# Patient Record
Sex: Male | Born: 1945 | Race: White | Hispanic: No | Marital: Married | Smoking: Former smoker
Health system: Southern US, Community
[De-identification: ages and names within clinical notes are randomized; demographics above are authoritative.]

## PROBLEM LIST (undated history)

## (undated) DIAGNOSIS — C44329 Squamous cell carcinoma of skin of other parts of face: Secondary | ICD-10-CM

## (undated) HISTORY — DX: Squamous cell carcinoma of skin of other parts of face: C44.329

## (undated) MED FILL — Ferumoxytol Inj 510 MG/17ML (30 MG/ML) (Elemental Fe): INTRAVENOUS | Qty: 17 | Status: AC

---

## 2020-06-12 DIAGNOSIS — C44329 Squamous cell carcinoma of skin of other parts of face: Secondary | ICD-10-CM | POA: Diagnosis not present

## 2020-06-16 DIAGNOSIS — C44329 Squamous cell carcinoma of skin of other parts of face: Secondary | ICD-10-CM

## 2020-08-07 DIAGNOSIS — C44329 Squamous cell carcinoma of skin of other parts of face: Secondary | ICD-10-CM

## 2020-09-10 ENCOUNTER — Encounter: Payer: Self-pay | Admitting: Oncology

## 2020-09-10 DIAGNOSIS — C44329 Squamous cell carcinoma of skin of other parts of face: Secondary | ICD-10-CM | POA: Insufficient documentation

## 2020-09-17 ENCOUNTER — Encounter: Payer: Self-pay | Admitting: Pharmacist

## 2020-09-17 DIAGNOSIS — C44329 Squamous cell carcinoma of skin of other parts of face: Secondary | ICD-10-CM

## 2020-09-27 ENCOUNTER — Other Ambulatory Visit: Payer: Self-pay | Admitting: Hematology and Oncology

## 2020-09-27 ENCOUNTER — Encounter: Payer: Self-pay | Admitting: Pharmacist

## 2020-09-27 DIAGNOSIS — C44329 Squamous cell carcinoma of skin of other parts of face: Secondary | ICD-10-CM | POA: Diagnosis not present

## 2020-09-27 LAB — BASIC METABOLIC PANEL
BUN: 21 (ref 4–21)
CO2: 27 — AB (ref 13–22)
Chloride: 101 (ref 99–108)
Creatinine: 1 (ref 0.6–1.3)
Glucose: 101
Potassium: 4.8 (ref 3.4–5.3)
Sodium: 137 (ref 137–147)

## 2020-09-27 LAB — HEPATIC FUNCTION PANEL
ALT: 21 (ref 10–40)
AST: 26 (ref 14–40)
Alkaline Phosphatase: 55 (ref 25–125)
Bilirubin, Total: 0.6

## 2020-09-27 LAB — COMPREHENSIVE METABOLIC PANEL
Albumin: 3.9 (ref 3.5–5.0)
Calcium: 9.5 (ref 8.7–10.7)

## 2020-09-27 LAB — CBC AND DIFFERENTIAL
HCT: 28 — AB (ref 41–53)
Hemoglobin: 9.2 — AB (ref 13.5–17.5)
Neutrophils Absolute: 5
Platelets: 303 (ref 150–399)
WBC: 5.3

## 2020-09-27 LAB — CBC: RBC: 3.34 — AB (ref 3.87–5.11)

## 2020-09-28 ENCOUNTER — Inpatient Hospital Stay: Payer: Medicare Other | Attending: Oncology

## 2020-09-28 ENCOUNTER — Other Ambulatory Visit: Payer: Self-pay | Admitting: Hematology and Oncology

## 2020-09-28 ENCOUNTER — Other Ambulatory Visit: Payer: Self-pay

## 2020-09-28 VITALS — BP 142/68 | HR 68 | Temp 99.0°F

## 2020-09-28 DIAGNOSIS — Z5111 Encounter for antineoplastic chemotherapy: Secondary | ICD-10-CM | POA: Insufficient documentation

## 2020-09-28 DIAGNOSIS — C44329 Squamous cell carcinoma of skin of other parts of face: Secondary | ICD-10-CM

## 2020-09-28 DIAGNOSIS — C4339 Malignant melanoma of other parts of face: Secondary | ICD-10-CM | POA: Insufficient documentation

## 2020-09-28 DIAGNOSIS — K122 Cellulitis and abscess of mouth: Secondary | ICD-10-CM | POA: Diagnosis not present

## 2020-09-28 MED ORDER — SODIUM CHLORIDE 0.9 % IV SOLN
Freq: Once | INTRAVENOUS | Status: AC
  Filled 2020-09-28: qty 10

## 2020-09-28 MED ORDER — SODIUM CHLORIDE 0.9% FLUSH
10.0000 mL | INTRAVENOUS | Status: DC | PRN
  Filled 2020-09-28: qty 10

## 2020-09-28 MED ORDER — SODIUM CHLORIDE 0.9 % IV SOLN
10.0000 mg | Freq: Once | INTRAVENOUS | Status: AC
  Administered 2020-09-28: 10 mg via INTRAVENOUS
  Filled 2020-09-28: qty 10

## 2020-09-28 MED ORDER — SODIUM CHLORIDE 0.9 % IV SOLN
150.0000 mg | Freq: Once | INTRAVENOUS | Status: AC
  Administered 2020-09-28: 150 mg via INTRAVENOUS
  Filled 2020-09-28: qty 150

## 2020-09-28 MED ORDER — PALONOSETRON HCL INJECTION 0.25 MG/5ML
0.2500 mg | Freq: Once | INTRAVENOUS | Status: AC
  Administered 2020-09-28: 0.25 mg via INTRAVENOUS

## 2020-09-28 MED ORDER — SODIUM CHLORIDE 0.9 % IV SOLN
Freq: Once | INTRAVENOUS | Status: AC
  Filled 2020-09-28: qty 250

## 2020-09-28 MED ORDER — PALONOSETRON HCL INJECTION 0.25 MG/5ML
INTRAVENOUS | Status: AC
  Filled 2020-09-28: qty 5

## 2020-09-28 MED ORDER — HEPARIN SOD (PORK) LOCK FLUSH 100 UNIT/ML IV SOLN
500.0000 [IU] | Freq: Once | INTRAVENOUS | Status: DC | PRN
  Filled 2020-09-28: qty 5

## 2020-09-28 MED ORDER — SODIUM CHLORIDE 0.9 % IV SOLN
52.0000 mg/m2 | Freq: Once | INTRAVENOUS | Status: AC
  Administered 2020-09-28: 95 mg via INTRAVENOUS
  Filled 2020-09-28: qty 95

## 2020-09-28 NOTE — Patient Instructions (Signed)
[facility] Discharge Instructions for Patients Receiving Chemotherapy  Today you received the following chemotherapy agents CISPLATIN  To help prevent nausea and vomiting after your treatment, we encourage you to take your nausea medicatioN.   If you develop nausea and vomiting that is not controlled by your nausea medication, call the clinic.   BELOW ARE SYMPTOMS THAT SHOULD BE REPORTED IMMEDIATELY:  *FEVER GREATER THAN 100.5 F  *CHILLS WITH OR WITHOUT FEVER  NAUSEA AND VOMITING THAT IS NOT CONTROLLED WITH YOUR NAUSEA MEDICATION  *UNUSUAL SHORTNESS OF BREATH  *UNUSUAL BRUISING OR BLEEDING  TENDERNESS IN MOUTH AND THROAT WITH OR WITHOUT PRESENCE OF ULCERS  *URINARY PROBLEMS  *BOWEL PROBLEMS  UNUSUAL RASH Items with * indicate a potential emergency and should be followed up as soon as possible.  Feel free to call the clinic should you have any questions or concerns. The clinic phone number is [phone].  Please show the [facility] at check-in to the Emergency Department and triage nurse.  Cisplatin injection What is this medicine? CISPLATIN (SIS pla tin) is a chemotherapy drug. It targets fast dividing cells, like cancer cells, and causes these cells to die. This medicine is used to treat many types of cancer like bladder, ovarian, and testicular cancers. This medicine may be used for other purposes; ask your health care provider or pharmacist if you have questions. COMMON BRAND NAME(S): Platinol, Platinol -AQ What should I tell my health care provider before I take this medicine? They need to know if you have any of these conditions:  eye disease, vision problems  hearing problems  kidney disease  low blood counts, like white cells, platelets, or red blood cells  tingling of the fingers or toes, or other nerve disorder  an unusual or allergic reaction to cisplatin, carboplatin, oxaliplatin, other medicines, foods, dyes, or  preservatives  pregnant or trying to get pregnant  breast-feeding How should I use this medicine? This drug is given as an infusion into a vein. It is administered in a hospital or clinic by a specially trained health care professional. Talk to your pediatrician regarding the use of this medicine in children. Special care may be needed. Overdosage: If you think you have taken too much of this medicine contact a poison control center or emergency room at once. NOTE: This medicine is only for you. Do not share this medicine with others. What if I miss a dose? It is important not to miss a dose. Call your doctor or health care professional if you are unable to keep an appointment. What may interact with this medicine? This medicine may interact with the following medications:  foscarnet  certain antibiotics like amikacin, gentamicin, neomycin, polymyxin B, streptomycin, tobramycin, vancomycin This list may not describe all possible interactions. Give your health care provider a list of all the medicines, herbs, non-prescription drugs, or dietary supplements you use. Also tell them if you smoke, drink alcohol, or use illegal drugs. Some items may interact with your medicine. What should I watch for while using this medicine? Your condition will be monitored carefully while you are receiving this medicine. You will need important blood work done while you are taking this medicine. This drug may make you feel generally unwell. This is not uncommon, as chemotherapy can affect healthy cells as well as cancer cells. Report any side effects. Continue your course of treatment even though you feel ill unless your doctor tells you to stop. This medicine may increase your risk of  getting an infection. Call your healthcare professional for advice if you get a fever, chills, or sore throat, or other symptoms of a cold or flu. Do not treat yourself. Try to avoid being around people who are sick. Avoid taking  medicines that contain aspirin, acetaminophen, ibuprofen, naproxen, or ketoprofen unless instructed by your healthcare professional. These medicines may hide a fever. This medicine may increase your risk to bruise or bleed. Call your doctor or health care professional if you notice any unusual bleeding. Be careful brushing and flossing your teeth or using a toothpick because you may get an infection or bleed more easily. If you have any dental work done, tell your dentist you are receiving this medicine. Do not become pregnant while taking this medicine or for 14 months after stopping it. Women should inform their healthcare professional if they wish to become pregnant or think they might be pregnant. Men should not father a child while taking this medicine and for 11 months after stopping it. There is potential for serious side effects to an unborn child. Talk to your healthcare professional for more information. Do not breast-feed an infant while taking this medicine. This medicine has caused ovarian failure in some women. This medicine may make it more difficult to get pregnant. Talk to your healthcare professional if you are concerned about your fertility. This medicine has caused decreased sperm counts in some men. This may make it more difficult to father a child. Talk to your healthcare professional if you are concerned about your fertility. Drink fluids as directed while you are taking this medicine. This will help protect your kidneys. Call your doctor or health care professional if you get diarrhea. Do not treat yourself. What side effects may I notice from receiving this medicine? Side effects that you should report to your doctor or health care professional as soon as possible:  allergic reactions like skin rash, itching or hives, swelling of the face, lips, or tongue  blurred vision  changes in vision  decreased hearing or ringing of the ears  nausea, vomiting  pain, redness, or  irritation at site where injected  pain, tingling, numbness in the hands or feet  signs and symptoms of bleeding such as bloody or black, tarry stools; red or dark brown urine; spitting up blood or brown material that looks like coffee grounds; red spots on the skin; unusual bruising or bleeding from the eyes, gums, or nose  signs and symptoms of infection like fever; chills; cough; sore throat; pain or trouble passing urine  signs and symptoms of kidney injury like trouble passing urine or change in the amount of urine  signs and symptoms of low red blood cells or anemia such as unusually weak or tired; feeling faint or lightheaded; falls; breathing problems Side effects that usually do not require medical attention (report to your doctor or health care professional if they continue or are bothersome):  loss of appetite  mouth sores  muscle cramps This list may not describe all possible side effects. Call your doctor for medical advice about side effects. You may report side effects to FDA at 1-800-FDA-1088. Where should I keep my medicine? This drug is given in a hospital or clinic and will not be stored at home. NOTE: This sheet is a summary. It may not cover all possible information. If you have questions about this medicine, talk to your doctor, pharmacist, or health care provider.  2020 Elsevier/Gold Standard (2018-12-04 15:59:17)

## 2020-09-29 ENCOUNTER — Inpatient Hospital Stay: Payer: Medicare Other

## 2020-10-02 ENCOUNTER — Other Ambulatory Visit: Payer: Self-pay | Admitting: Pharmacist

## 2020-10-02 ENCOUNTER — Inpatient Hospital Stay: Payer: Medicare Other

## 2020-10-02 DIAGNOSIS — C44329 Squamous cell carcinoma of skin of other parts of face: Secondary | ICD-10-CM | POA: Diagnosis not present

## 2020-10-02 DIAGNOSIS — L0390 Cellulitis, unspecified: Secondary | ICD-10-CM

## 2020-10-03 ENCOUNTER — Inpatient Hospital Stay: Payer: Medicare Other

## 2020-10-03 ENCOUNTER — Other Ambulatory Visit: Payer: Self-pay | Admitting: Pharmacist

## 2020-10-03 ENCOUNTER — Other Ambulatory Visit: Payer: Self-pay

## 2020-10-03 VITALS — BP 108/58 | HR 78 | Temp 98.7°F | Resp 20 | Wt 145.0 lb

## 2020-10-03 DIAGNOSIS — L0390 Cellulitis, unspecified: Secondary | ICD-10-CM

## 2020-10-03 DIAGNOSIS — Z5111 Encounter for antineoplastic chemotherapy: Secondary | ICD-10-CM | POA: Diagnosis not present

## 2020-10-03 MED ORDER — DEXTROSE 5 % IV SOLN
2.0000 g | Freq: Once | INTRAVENOUS | Status: AC
  Administered 2020-10-03: 2 g via INTRAVENOUS
  Filled 2020-10-03: qty 20

## 2020-10-03 MED ORDER — SODIUM CHLORIDE 0.9 % IV SOLN
INTRAVENOUS | Status: DC | PRN
  Administered 2020-10-03: 500 mL via INTRAVENOUS
  Filled 2020-10-03: qty 250

## 2020-10-03 NOTE — Patient Instructions (Signed)
[facility] Discharge Instructions for Patients Receiving Chemotherapy  Today you received the following chemotherapy agents Rocephin IV  To help prevent nausea and vomiting after your treatment, we encourage you to take your nausea medication as orders of MD.   If you develop nausea and vomiting that is not controlled by your nausea medication, call the clinic.   BELOW ARE SYMPTOMS THAT SHOULD BE REPORTED IMMEDIATELY:  *FEVER GREATER THAN 100.5 F  *CHILLS WITH OR WITHOUT FEVER  NAUSEA AND VOMITING THAT IS NOT CONTROLLED WITH YOUR NAUSEA MEDICATION  *UNUSUAL SHORTNESS OF BREATH  *UNUSUAL BRUISING OR BLEEDING  TENDERNESS IN MOUTH AND THROAT WITH OR WITHOUT PRESENCE OF ULCERS  *URINARY PROBLEMS  *BOWEL PROBLEMS  UNUSUAL RASH Items with * indicate a potential emergency and should be followed up as soon as possible.  Feel free to call the clinic should you have any questions or concerns at The clinic phone number is [phone].  Please show the [facility] at check-in to the Emergency Department and triage nurse.

## 2020-10-04 ENCOUNTER — Inpatient Hospital Stay: Payer: Medicare Other

## 2020-10-04 VITALS — BP 99/61 | HR 107 | Temp 98.1°F | Resp 18 | Ht 69.0 in | Wt 146.8 lb

## 2020-10-04 DIAGNOSIS — C44329 Squamous cell carcinoma of skin of other parts of face: Secondary | ICD-10-CM

## 2020-10-04 DIAGNOSIS — Z5111 Encounter for antineoplastic chemotherapy: Secondary | ICD-10-CM | POA: Diagnosis not present

## 2020-10-04 MED ORDER — CEFTRIAXONE SODIUM 2 G IJ SOLR
2.0000 g | Freq: Once | INTRAMUSCULAR | Status: AC
  Administered 2020-10-04: 2 g via INTRAMUSCULAR
  Filled 2020-10-04: qty 20

## 2020-10-04 NOTE — Progress Notes (Signed)
Pt is stable at discharge. 

## 2020-10-04 NOTE — Progress Notes (Deleted)
Patient has not arrived for 10:15 antibiotic infusion appointment. Voicemail left on his phone and his wife's phone. Awaiting return call.  Zandra Abts [facility]  10/04/2020  10:53 AM

## 2020-10-04 NOTE — Patient Instructions (Signed)
Pt stated that he feels better and his face on the right side has went down significantly. The pt is tolerating the im injections well without problems.

## 2020-10-05 ENCOUNTER — Inpatient Hospital Stay: Payer: Medicare Other

## 2020-10-05 ENCOUNTER — Ambulatory Visit: Payer: Medicare Other

## 2020-10-05 ENCOUNTER — Other Ambulatory Visit: Payer: Self-pay | Admitting: Hematology and Oncology

## 2020-10-05 LAB — MAGNESIUM: Magnesium: 0.9

## 2020-10-06 ENCOUNTER — Other Ambulatory Visit: Payer: Self-pay

## 2020-10-06 ENCOUNTER — Inpatient Hospital Stay: Payer: Medicare Other

## 2020-10-06 DIAGNOSIS — Z5111 Encounter for antineoplastic chemotherapy: Secondary | ICD-10-CM | POA: Diagnosis not present

## 2020-10-06 MED ORDER — MAGNESIUM SULFATE 2 GM/50ML IV SOLN
INTRAVENOUS | Status: AC
  Filled 2020-10-06: qty 50

## 2020-10-06 MED ORDER — MAGNESIUM SULFATE 4 GM/100ML IV SOLN
INTRAVENOUS | Status: AC
  Filled 2020-10-06: qty 100

## 2020-10-06 MED ORDER — SODIUM CHLORIDE 0.9 % IV SOLN
Freq: Once | INTRAVENOUS | Status: AC
  Filled 2020-10-06: qty 250

## 2020-10-06 MED ORDER — MAGNESIUM SULFATE 4 GM/100ML IV SOLN
4.0000 g | Freq: Once | INTRAVENOUS | Status: AC
  Administered 2020-10-06: 4 g via INTRAVENOUS

## 2020-10-06 MED ORDER — MAGNESIUM SULFATE 2 GM/50ML IV SOLN
2.0000 g | Freq: Once | INTRAVENOUS | Status: AC
  Administered 2020-10-06: 2 g via INTRAVENOUS

## 2020-10-06 NOTE — Patient Instructions (Signed)
Magnesium Sulfate injection What is this medicine? MAGNESIUM SULFATE (mag NEE zee um SUL fate) is an electrolyte injection commonly used to treat low magnesium levels in your blood. It is also used to prevent or control seizures in women with preeclampsia or eclampsia. This medicine may be used for other purposes; ask your health care provider or pharmacist if you have questions. What should I tell my health care provider before I take this medicine? They need to know if you have any of these conditions:  heart disease  history of irregular heart beat  kidney disease  an unusual or allergic reaction to magnesium sulfate, medicines, foods, dyes, or preservatives  pregnant or trying to get pregnant  breast-feeding How should I use this medicine? This medicine is for infusion into a vein. It is given by a health care professional in a hospital or clinic setting. Talk to your pediatrician regarding the use of this medicine in children. While this drug may be prescribed for selected conditions, precautions do apply. Overdosage: If you think you have taken too much of this medicine contact a poison control center or emergency room at once. NOTE: This medicine is only for you. Do not share this medicine with others. What if I miss a dose? This does not apply. What may interact with this medicine? This medicine may interact with the following medications:  certain medicines for anxiety or sleep  certain medicines for seizures like phenobarbital  digoxin  medicines that relax muscles for surgery  narcotic medicines for pain This list may not describe all possible interactions. Give your health care provider a list of all the medicines, herbs, non-prescription drugs, or dietary supplements you use. Also tell them if you smoke, drink alcohol, or use illegal drugs. Some items may interact with your medicine. What should I watch for while using this medicine? Your condition will be  monitored carefully while you are receiving this medicine. You may need blood work done while you are receiving this medicine. What side effects may I notice from receiving this medicine? Side effects that you should report to your doctor or health care professional as soon as possible:  allergic reactions like skin rash, itching or hives, swelling of the face, lips, or tongue  facial flushing  muscle weakness  signs and symptoms of low blood pressure like dizziness; feeling faint or lightheaded, falls; unusually weak or tired  signs and symptoms of a dangerous change in heartbeat or heart rhythm like chest pain; dizziness; fast or irregular heartbeat; palpitations; breathing problems  sweating This list may not describe all possible side effects. Call your doctor for medical advice about side effects. You may report side effects to FDA at 1-800-FDA-1088. Where should I keep my medicine? This drug is given in a hospital or clinic and will not be stored at home. NOTE: This sheet is a summary. It may not cover all possible information. If you have questions about this medicine, talk to your doctor, pharmacist, or health care provider.  2020 Elsevier/Gold Standard (2016-06-26 12:31:42)  

## 2020-10-06 NOTE — Progress Notes (Signed)
PT STABLE AT TIME OF DISCHARGE 

## 2020-10-09 ENCOUNTER — Other Ambulatory Visit: Payer: Self-pay | Admitting: Hematology and Oncology

## 2020-10-09 LAB — MAGNESIUM: Magnesium: 1.3 (ref 1.6–2.3)

## 2020-10-30 ENCOUNTER — Telehealth: Payer: Self-pay

## 2020-10-30 ENCOUNTER — Other Ambulatory Visit: Payer: Self-pay | Admitting: Hematology and Oncology

## 2020-10-30 MED ORDER — PREDNISONE 20 MG PO TABS: 20.0000 mg | ORAL_TABLET | Freq: Every day | ORAL | 0 refills | Status: DC

## 2020-10-30 NOTE — Telephone Encounter (Signed)
Opened in error

## 2020-10-30 NOTE — Telephone Encounter (Addendum)
Pt's girlfriend, Eustaquio Maize, called to report that pt "has gout in his hands & feet again. He cant take the colchicine nor the preventative medicine, because of the way it makes him feel. He also is breaking out again since he is out of prednisone. He needs a refill of prednisone or needs to come in to be seen". [phone]   Notified Dr Bobby Rumpf of above. He request that I forward this to Melissa,NP. He is fine with reordering prednisone if that is what she thinks he needs.  I sent message to [facility]. She sent in prednisone to [facility] on [facility].  I called pt's wife, & notified her of above. She states she will pickup the prescription there this time. But they use [facility].

## 2020-11-03 ENCOUNTER — Other Ambulatory Visit: Payer: Self-pay | Admitting: Oncology

## 2020-11-03 ENCOUNTER — Other Ambulatory Visit: Payer: Self-pay | Admitting: Hematology and Oncology

## 2020-11-03 MED ORDER — OXYCODONE HCL 20 MG PO TABS
20.0000 mg | ORAL_TABLET | ORAL | 0 refills | Status: DC | PRN
Start: 2020-11-03 — End: 2020-11-03

## 2020-11-03 MED ORDER — OXYCODONE HCL 20 MG PO TABS
20.0000 mg | ORAL_TABLET | ORAL | 0 refills | Status: DC | PRN
Start: 2020-11-03 — End: 2020-11-20

## 2020-11-06 ENCOUNTER — Other Ambulatory Visit: Payer: Self-pay | Admitting: Hematology and Oncology

## 2020-11-06 DIAGNOSIS — C44329 Squamous cell carcinoma of skin of other parts of face: Secondary | ICD-10-CM

## 2020-11-06 MED ORDER — PREDNISONE 20 MG PO TABS: 20.0000 mg | ORAL_TABLET | Freq: Every day | ORAL | 0 refills | Status: AC

## 2020-11-20 ENCOUNTER — Other Ambulatory Visit: Payer: Self-pay | Admitting: Hematology and Oncology

## 2020-11-20 ENCOUNTER — Inpatient Hospital Stay: Payer: Medicare Other | Admitting: Oncology

## 2020-11-20 ENCOUNTER — Other Ambulatory Visit: Payer: Self-pay

## 2020-11-20 ENCOUNTER — Inpatient Hospital Stay: Payer: Medicare Other

## 2020-11-20 DIAGNOSIS — R6884 Jaw pain: Secondary | ICD-10-CM

## 2020-11-20 DIAGNOSIS — C44329 Squamous cell carcinoma of skin of other parts of face: Secondary | ICD-10-CM

## 2020-11-20 MED ORDER — OXYCODONE HCL 15 MG PO TABS: 15.0000 mg | ORAL_TABLET | ORAL | 0 refills | Status: DC | PRN

## 2020-11-27 ENCOUNTER — Telehealth: Payer: Self-pay | Admitting: Oncology

## 2020-11-27 NOTE — Telephone Encounter (Signed)
Patient's spouse called to Reschedule 11/29 Labs, Follow Up Appt to 12/10 to begin at 11:00 am.

## 2020-11-30 ENCOUNTER — Other Ambulatory Visit: Payer: Self-pay | Admitting: Oncology

## 2020-11-30 DIAGNOSIS — C44329 Squamous cell carcinoma of skin of other parts of face: Secondary | ICD-10-CM

## 2020-11-30 NOTE — Progress Notes (Signed)
[facility]  [address] [place],  [place]  [address] [phone]  Clinic Day:  12/01/2020   HISTORY OF PRESENT ILLNESS:  The patient is a 74 y.o. male with recurrent/locally advanced squamous cell skin cancer.  He recently completed his definitive chemoradiation in October 2021, which consisted of 3 cycles of cisplatin.  At his last visit, his jaw was swollen for which he was given ceftriaxone and clindamycin to treat.  Since his last visit, the patient has been doing much better.  He denies having having any right mouth/neck redness or swelling which concerns him for persistent disease.    PHYSICAL EXAM:  Blood pressure (!) 154/78, pulse (!) 101, temperature 98.4 F (36.9 C), resp. rate 16, height 5\' 9"  (1.753 m), weight 139 lb 14.4 oz (63.5 kg), SpO2 92 %. Wt Readings from Last 3 Encounters:  12/01/20 139 lb 14.4 oz (63.5 kg)  10/13/20 144 lb 6 oz (65.5 kg)  10/06/20 147 lb (66.7 kg)   Body mass index is 20.66 kg/m. Performance status (ECOG): 1 - Symptomatic but completely ambulatory Physical Exam Constitutional:      Appearance: Normal appearance. He is not ill-appearing.  HENT:     Mouth/Throat:     Mouth: Mucous membranes are moist.     Pharynx: Oropharynx is clear. No oropharyngeal exudate or posterior oropharyngeal erythema.  Cardiovascular:     Rate and Rhythm: Normal rate and regular rhythm.     Heart sounds: No murmur heard. No friction rub. No gallop.   Pulmonary:     Effort: Pulmonary effort is normal. No respiratory distress.     Breath sounds: Normal breath sounds. No wheezing, rhonchi or rales.  Chest:  Breasts:     Right: No axillary adenopathy or supraclavicular adenopathy.     Left: No axillary adenopathy or supraclavicular adenopathy.    Abdominal:     General: Bowel sounds are normal. There is no distension.     Palpations: Abdomen is soft. There is no mass.     Tenderness: There is no abdominal tenderness.   Musculoskeletal:        General: No swelling.     Right lower leg: No edema.     Left lower leg: No edema.  Lymphadenopathy:     Cervical: No cervical adenopathy.     Upper Body:     Right upper body: No supraclavicular or axillary adenopathy.     Left upper body: No supraclavicular or axillary adenopathy.     Lower Body: No right inguinal adenopathy. No left inguinal adenopathy.  Skin:    General: Skin is warm.     Coloration: Skin is not jaundiced.     Findings: No lesion or rash.  Neurological:     General: No focal deficit present.     Mental Status: He is alert and oriented to person, place, and time. Mental status is at baseline.     Cranial Nerves: Cranial nerves are intact.  Psychiatric:        Mood and Affect: Mood normal.        Behavior: Behavior normal.        Thought Content: Thought content normal.     LABS:   CBC Latest Ref Rng & Units 12/01/2020 09/27/2020  WBC - 7.3 5.3  Hemoglobin 13.5 - 17.5 9.8(A) 9.2(A)  Hematocrit 41 - 53 30(A) 28(A)  Platelets 150 - 399 333 303   CMP Latest Ref Rng & Units 12/01/2020 09/27/2020  BUN  4 - 21 23(A) 21  Creatinine 0.6 - 1.3 1.1 1.0  Sodium 137 - 147 137 137  Potassium 3.4 - 5.3 4.4 4.8  Chloride 99 - 108 100 101  CO2 13 - 22 29(A) 27(A)  Calcium 8.7 - 10.7 8.9 9.5  Alkaline Phos 25 - 125 - 55  AST 14 - 40 - 26  ALT 10 - 40 - 21    ASSESSMENT & PLAN:  Assessment/Plan:  A 74 y.o. male with recurrent/locally advanced squamous cell skin cancer.  Per his physical exam today, his right neck and facial region look great.  There are no obvious signs of persistent disease being in this area.  Clinically, the patient appears to be doing well.  I will see him back in early February 2021 for repeat clinical assessment.  A PET scan will be done before his next visit to ascertain his new disease baseline 3 months after the completion of his definitive chemoradiation.  The patient understands all the plans discussed today and is in  agreement with them.    Gatsby Chismar Macarthur Critchley, MD

## 2020-12-01 ENCOUNTER — Encounter: Payer: Self-pay | Admitting: Oncology

## 2020-12-01 ENCOUNTER — Inpatient Hospital Stay: Payer: Medicare Other | Attending: Oncology | Admitting: Oncology

## 2020-12-01 ENCOUNTER — Inpatient Hospital Stay: Payer: Medicare Other

## 2020-12-01 ENCOUNTER — Telehealth: Payer: Self-pay | Admitting: Oncology

## 2020-12-01 ENCOUNTER — Other Ambulatory Visit: Payer: Self-pay

## 2020-12-01 ENCOUNTER — Other Ambulatory Visit: Payer: Self-pay | Admitting: Hematology and Oncology

## 2020-12-01 ENCOUNTER — Other Ambulatory Visit: Payer: Self-pay | Admitting: Oncology

## 2020-12-01 VITALS — BP 154/78 | HR 101 | Temp 98.4°F | Resp 16 | Ht 69.0 in | Wt 139.9 lb

## 2020-12-01 DIAGNOSIS — C44329 Squamous cell carcinoma of skin of other parts of face: Secondary | ICD-10-CM | POA: Diagnosis not present

## 2020-12-01 LAB — BASIC METABOLIC PANEL
BUN: 23 — AB (ref 4–21)
CO2: 29 — AB (ref 13–22)
Chloride: 100 (ref 99–108)
Creatinine: 1.1 (ref 0.6–1.3)
Glucose: 115
Potassium: 4.4 (ref 3.4–5.3)
Sodium: 137 (ref 137–147)

## 2020-12-01 LAB — CBC AND DIFFERENTIAL
HCT: 30 — AB (ref 41–53)
Hemoglobin: 9.8 — AB (ref 13.5–17.5)
Neutrophils Absolute: 6.06
Platelets: 333 (ref 150–399)
WBC: 7.3

## 2020-12-01 LAB — MAGNESIUM: Magnesium: 1.1

## 2020-12-01 LAB — CBC: RBC: 3.42 — AB (ref 3.87–5.11)

## 2020-12-01 LAB — COMPREHENSIVE METABOLIC PANEL: Calcium: 8.9 (ref 8.7–10.7)

## 2020-12-01 NOTE — Telephone Encounter (Signed)
Dr Bobby Rumpf computer is down will have to call patient with appt

## 2020-12-07 ENCOUNTER — Other Ambulatory Visit: Payer: Self-pay

## 2020-12-07 DIAGNOSIS — R6884 Jaw pain: Secondary | ICD-10-CM

## 2020-12-07 DIAGNOSIS — C44329 Squamous cell carcinoma of skin of other parts of face: Secondary | ICD-10-CM

## 2020-12-07 MED ORDER — OXYCODONE HCL 15 MG PO TABS: 15.0000 mg | ORAL_TABLET | ORAL | 0 refills | Status: DC | PRN

## 2020-12-07 NOTE — Telephone Encounter (Signed)
Sent!

## 2020-12-25 ENCOUNTER — Other Ambulatory Visit: Payer: Self-pay

## 2020-12-25 DIAGNOSIS — R6884 Jaw pain: Secondary | ICD-10-CM

## 2020-12-25 DIAGNOSIS — C44329 Squamous cell carcinoma of skin of other parts of face: Secondary | ICD-10-CM

## 2020-12-25 MED ORDER — OXYCODONE HCL 15 MG PO TABS: 15.0000 mg | ORAL_TABLET | ORAL | 0 refills | Status: DC | PRN

## 2020-12-25 NOTE — Telephone Encounter (Signed)
Sent!

## 2021-01-10 ENCOUNTER — Encounter (HOSPITAL_COMMUNITY): Payer: Self-pay

## 2021-01-10 ENCOUNTER — Other Ambulatory Visit: Payer: Self-pay

## 2021-01-10 ENCOUNTER — Emergency Department (HOSPITAL_COMMUNITY)
Admission: EM | Admit: 2021-01-10 | Discharge: 2021-01-11 | Disposition: A | Discharge status: Home / Self Care | Payer: Medicare HMO | Attending: Emergency Medicine | Admitting: Emergency Medicine

## 2021-01-10 DIAGNOSIS — Z79899 Other long term (current) drug therapy: Secondary | ICD-10-CM | POA: Diagnosis not present

## 2021-01-10 DIAGNOSIS — M109 Gout, unspecified: Secondary | ICD-10-CM | POA: Diagnosis not present

## 2021-01-10 DIAGNOSIS — Z85828 Personal history of other malignant neoplasm of skin: Secondary | ICD-10-CM | POA: Diagnosis not present

## 2021-01-10 DIAGNOSIS — Z87891 Personal history of nicotine dependence: Secondary | ICD-10-CM | POA: Diagnosis not present

## 2021-01-10 DIAGNOSIS — Z7901 Long term (current) use of anticoagulants: Secondary | ICD-10-CM | POA: Insufficient documentation

## 2021-01-10 DIAGNOSIS — M25531 Pain in right wrist: Secondary | ICD-10-CM | POA: Diagnosis present

## 2021-01-10 LAB — COMPREHENSIVE METABOLIC PANEL
ALT: 16 U/L (ref 0–44)
AST: 25 U/L (ref 15–41)
Albumin: 3.2 g/dL — ABNORMAL LOW (ref 3.5–5.0)
Alkaline Phosphatase: 78 U/L (ref 38–126)
Anion gap: 13 (ref 5–15)
BUN: 26 mg/dL — ABNORMAL HIGH (ref 8–23)
CO2: 28 mmol/L (ref 22–32)
Calcium: 8.2 mg/dL — ABNORMAL LOW (ref 8.9–10.3)
Chloride: 96 mmol/L — ABNORMAL LOW (ref 98–111)
Creatinine, Ser: 1.36 mg/dL — ABNORMAL HIGH (ref 0.61–1.24)
GFR, Estimated: 55 mL/min — ABNORMAL LOW (ref >60)
Glucose, Bld: 112 mg/dL — ABNORMAL HIGH (ref 70–99)
Potassium: 4.3 mmol/L (ref 3.5–5.1)
Sodium: 137 mmol/L (ref 135–145)
Total Bilirubin: 0.9 mg/dL (ref 0.3–1.2)
Total Protein: 7 g/dL (ref 6.5–8.1)

## 2021-01-10 LAB — CBC
HCT: 36.4 % — ABNORMAL LOW (ref 39.0–52.0)
Hemoglobin: 10.7 g/dL — ABNORMAL LOW (ref 13.0–17.0)
MCH: 26.9 pg (ref 26.0–34.0)
MCHC: 29.4 g/dL — ABNORMAL LOW (ref 30.0–36.0)
MCV: 91.5 fL (ref 80.0–100.0)
Platelets: 304 10*3/uL (ref 150–400)
RBC: 3.98 MIL/uL — ABNORMAL LOW (ref 4.22–5.81)
RDW: 16.2 % — ABNORMAL HIGH (ref 11.5–15.5)
WBC: 11.9 10*3/uL — ABNORMAL HIGH (ref 4.0–10.5)
nRBC: 0 % (ref 0.0–0.2)

## 2021-01-10 LAB — LIPASE, BLOOD: Lipase: 19 U/L (ref 11–51)

## 2021-01-10 NOTE — ED Triage Notes (Signed)
Patient arrives from home, sent by PCP for concern about kidney function, "they told me I need a kidney doctor", also reports bilateral hand pain

## 2021-01-11 ENCOUNTER — Emergency Department (HOSPITAL_COMMUNITY): Payer: Medicare HMO

## 2021-01-11 ENCOUNTER — Other Ambulatory Visit: Payer: Self-pay

## 2021-01-11 DIAGNOSIS — R6884 Jaw pain: Secondary | ICD-10-CM

## 2021-01-11 DIAGNOSIS — M109 Gout, unspecified: Secondary | ICD-10-CM | POA: Diagnosis not present

## 2021-01-11 DIAGNOSIS — C44329 Squamous cell carcinoma of skin of other parts of face: Secondary | ICD-10-CM

## 2021-01-11 LAB — URINALYSIS, ROUTINE W REFLEX MICROSCOPIC
Bilirubin Urine: NEGATIVE
Glucose, UA: NEGATIVE mg/dL
Hgb urine dipstick: NEGATIVE
Ketones, ur: NEGATIVE mg/dL
Leukocytes,Ua: NEGATIVE
Nitrite: NEGATIVE
Protein, ur: NEGATIVE mg/dL
Specific Gravity, Urine: 1.013 (ref 1.005–1.030)
pH: 6 (ref 5.0–8.0)

## 2021-01-11 MED ORDER — PREDNISONE 20 MG PO TABS: ORAL_TABLET | ORAL | 0 refills | Status: DC

## 2021-01-11 MED ORDER — FENTANYL CITRATE (PF) 100 MCG/2ML IJ SOLN
100.0000 ug | Freq: Once | INTRAMUSCULAR | Status: AC
  Administered 2021-01-11: 100 ug via INTRAVENOUS
  Filled 2021-01-11: qty 2

## 2021-01-11 MED ORDER — METHYLPREDNISOLONE SODIUM SUCC 125 MG IJ SOLR
125.0000 mg | Freq: Once | INTRAMUSCULAR | Status: AC
  Administered 2021-01-11: 125 mg via INTRAVENOUS
  Filled 2021-01-11: qty 2

## 2021-01-11 MED ORDER — PREDNISONE 20 MG PO TABS
ORAL_TABLET | ORAL | 0 refills | Status: DC
Start: 2021-01-11 — End: 2024-02-16

## 2021-01-11 MED ORDER — HYDROMORPHONE HCL 1 MG/ML IJ SOLN
0.5000 mg | Freq: Once | INTRAMUSCULAR | Status: AC
  Administered 2021-01-11: 0.5 mg via INTRAVENOUS
  Filled 2021-01-11: qty 1

## 2021-01-11 NOTE — ED Provider Notes (Addendum)
[facility] EMERGENCY DEPARTMENT Provider Note   CSN: [id number] Arrival date & time: 01/10/21  1731     History Chief Complaint  Patient presents with  . Abdominal Pain  . Hand Pain    Phillip Logan is a 75 y.o. male.  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  Patient is overall poor historian and cannot give me details as to why he was admitted, however does bring hospital discharge paperwork with him.  Prior to hospitalization patient presented to an urgent care and was found to be hypotensive with systolic blood pressures in the 80s.  He went there for wrist pain thought to be secondary to gout.  Patient was admitted to the hospital with serum creatinine of 6.9, BUN of 75, uric acid of 20 (treated inpatient with Uloric), hemoglobin of 8.7..  He was COVID-negative.  He had a chest x-ray which was concerning for mild pneumonia and he was treated with IV antibiotics.  He was not able to be seen by nephrology and was encouraged to follow-up with his doctor.  Hemoglobin dropped to 7 and patient was transfused 2 units of packed red cells.  It was recommended that he be transferred to [facility] however no beds were available.  Pt left AGAINST MEDICAL ADVICE on the 13th.  Patient presented to his doctor yesterday for what sounds like hospital follow-up.  They were concerned and sent him back to the emergency department for further evaluation and "to see a kidney specialist".  Patient's current main complaint is swollen, red joints in his bilateral wrists.  He is unable to give me further details about his doctors visit yesterday.  Unfortunately, patient has extended wait time in the emergency department.  Patient was discharged home from  the hospital on 5 additional days of prednisone which she completed.  Per patient's report, it seemed as though the swelling and pain worsened after completion of prednisone.        Past Medical History:  Diagnosis Date  . Squamous cell carcinoma of chin     Patient Active Problem List   Diagnosis Date Noted  . Hypomagnesemia 10/05/2020  . Cellulitis 10/02/2020  . Squamous cell carcinoma of skin of other parts of face 09/10/2020    History reviewed. No pertinent surgical history.     History reviewed. No pertinent family history.  Social History   Tobacco Use  . Smoking status: Former Research scientist (life sciences)  . Smokeless tobacco: Never Used  Substance Use Topics  . Alcohol use: Not Currently  . Drug use: Not Currently    Home Medications Prior to Admission medications   Medication Sig Start Date End Date Taking? Authorizing Provider  acetaminophen (TYLENOL) 325 MG tablet Take 650 mg by mouth every 6 (six) hours as needed.    [provider]  albuterol (VENTOLIN HFA) 108 (90 Base) MCG/ACT inhaler Inhale into the lungs every 6 (six) hours as needed for wheezing or shortness of breath.    [provider]  apixaban (ELIQUIS) 2.5 MG TABS tablet Take by mouth 2 (two) times daily.    [provider]  benzonatate (TESSALON) 100 MG capsule Take by mouth 3 (three) times daily as needed for cough.    [provider]  clindamycin (CLEOCIN) 300 MG capsule Take 300 mg by mouth  3 (three) times daily.    [provider]  diphenhydrAMINE (BENADRYL) 25 mg capsule Take 25 mg by mouth every 6 (six) hours as needed.    [provider]  famotidine (PEPCID) 20 MG tablet Take 20 mg by mouth 2 (two) times daily.    [provider]  fluconazole (DIFLUCAN) 150 MG tablet Take 150 mg by mouth daily.    [provider]  furosemide (LASIX) 20 MG tablet Take 20 mg by mouth.    [provider]  gabapentin (NEURONTIN) 300 MG capsule Take  300 mg by mouth 2 (two) times daily.    [provider]  hydrOXYzine (ATARAX/VISTARIL) 25 MG tablet Take 25 mg by mouth 3 (three) times daily as needed.    [provider]  lidocaine (XYLOCAINE) 2 % solution Use as directed 15 mLs in the mouth or throat as needed for mouth pain.    [provider]  magic mouthwash SOLN Take 5 mLs by mouth.    [provider]  Multiple Vitamins-Minerals (MULTIVITAMIN WITH MINERALS) tablet Take 1 tablet by mouth daily.    [provider]  nebivolol (BYSTOLIC) 5 MG tablet Take 5 mg by mouth daily.    [provider]  ondansetron (ZOFRAN) 4 MG tablet Take 4 mg by mouth every 8 (eight) hours as needed for nausea or vomiting.    [provider]  oxyCODONE (ROXICODONE) 15 MG immediate release tablet Take 1 tablet (15 mg total) by mouth every 4 (four) hours as needed for pain. 12/25/20   Dayton Scrape A, NP  potassium chloride SA (KLOR-CON) 20 MEQ tablet Take 20 mEq by mouth once.    [provider]  prochlorperazine (COMPAZINE) 10 MG tablet Take 10 mg by mouth every 6 (six) hours as needed for nausea or vomiting.    [provider]  tamsulosin (FLOMAX) 0.4 MG CAPS capsule Take 0.4 mg by mouth.    [provider]  tetracycline (SUMYCIN) 250 MG capsule Take 250 mg by mouth 4 (four) times daily.    [provider]  thiamine 100 MG tablet Take 100 mg by mouth daily.    [provider]    Allergies    Ciprofloxacin, Clindamycin/lincomycin, Clonidine derivatives, Cyproheptadine, Lisinopril, Myrbetriq [mirabegron], Penicillins, and Streptokinases  Review of Systems   Review of Systems  Constitutional: Negative for fever.  HENT: Negative for rhinorrhea and sore throat.   Eyes: Negative for redness.  Respiratory: Negative for cough and shortness of breath.   Cardiovascular: Negative for chest pain.  Gastrointestinal: Negative for abdominal pain, diarrhea, nausea and  vomiting.  Genitourinary: Negative for dysuria and hematuria.  Musculoskeletal: Positive for arthralgias and joint swelling. Negative for myalgias.  Skin: Negative for rash.  Neurological: Negative for headaches.    Physical Exam Updated Vital Signs BP 137/88 (BP Location: Right Arm)   Pulse 87   Temp 99.9 F (37.7 C) (Oral)   Resp 16   Ht 5\' 9"  (1.753 m)   Wt 59 kg   SpO2 97%   BMI 19.20 kg/m   Physical Exam Vitals and nursing note reviewed.  Constitutional:      Appearance: He is well-developed and well-nourished.  HENT:     Head: Normocephalic and atraumatic.  Eyes:     General:        Right eye: No discharge.        Left eye: No discharge.     Conjunctiva/sclera: Conjunctivae normal.  Cardiovascular:  Rate and Rhythm: Normal rate and regular rhythm.     Pulses:          Radial pulses are 2+ on the right side and 2+ on the left side.     Heart sounds: Normal heart sounds.  Pulmonary:     Effort: Pulmonary effort is normal.     Breath sounds: Normal breath sounds.  Abdominal:     Palpations: Abdomen is soft.     Tenderness: There is no abdominal tenderness.  Musculoskeletal:     Cervical back: Normal range of motion and neck supple.     Comments: Patient with bilateral edema of the hands and wrists with erythema, right greater than left.  Patient has significant pain with motion. Consistent with gout with tophi.   Skin:    General: Skin is warm and dry.  Neurological:     Mental Status: He is alert.  Psychiatric:        Mood and Affect: Mood and affect normal.          ED Results / Procedures / Treatments   Labs (all labs ordered are listed, but only abnormal results are displayed) Labs Reviewed  COMPREHENSIVE METABOLIC PANEL - Abnormal; Notable for the following components:      Result Value   Chloride 96 (*)    Glucose, Bld 112 (*)    BUN 26 (*)    Creatinine, Ser 1.36 (*)    Calcium 8.2 (*)    Albumin 3.2 (*)    GFR, Estimated 55 (*)     All other components within normal limits  CBC - Abnormal; Notable for the following components:   WBC 11.9 (*)    RBC 3.98 (*)    Hemoglobin 10.7 (*)    HCT 36.4 (*)    MCHC 29.4 (*)    RDW 16.2 (*)    All other components within normal limits  LIPASE, BLOOD  URINALYSIS, ROUTINE W REFLEX MICROSCOPIC    EKG None  Radiology DG Chest Port 1 View  Result Date: 01/11/2021 CLINICAL DATA:  Recent pneumonia.  Shortness of breath. EXAM: PORTABLE CHEST 1 VIEW COMPARISON:  12/31/2020 FINDINGS: The heart size appears within normal limits. No pleural effusion identified. Persistent bilateral pulmonary opacities within the lingula, anterior basal right upper lobe and right lower lobe noted. No new or progressive findings identified. IMPRESSION: Persistent bilateral pulmonary opacities compatible with multifocal infection. Electronically Signed   By: Kerby Moors M.D.   On: 01/11/2021 10:10    Procedures Procedures (including critical care time)  Medications Ordered in ED Medications  methylPREDNISolone sodium succinate (SOLU-MEDROL) 125 mg/2 mL injection 125 mg (125 mg Intravenous Given 01/11/21 0956)  fentaNYL (SUBLIMAZE) injection 100 mcg (100 mcg Intravenous Given 01/11/21 0956)  HYDROmorphone (DILAUDID) injection 0.5 mg (0.5 mg Intravenous Given 01/11/21 1033)    ED Course  I have reviewed the triage vital signs and the nursing notes.  Pertinent labs & imaging results that were available during my care of the patient were reviewed by me and considered in my medical decision making (see chart for details).  Patient seen and examined.  I spent time reviewing the patient's hospitalization summary and talking with him.  Patient's main complaint currently is severe pain and swelling of his wrists, likely related to gout.  Lower concern for septic arthritis.  Feel this is less likely given that it is bilateral, no significant fevers and patient's previous history of gout.  He seemed to have  improvement while on  prednisone, now worse off prednisone.  In regards to his acute kidney injury, renal function seems to be recovered from recent hospitalization at [facility]  Today his creatinine is 1.36 with a BUN of 26.  His potassium is normal.  He is urinating.  He does not have signs of fluid overload.  Will check chest x-ray given recent pneumonia.  I do not see an acute reason to admit the patient to the hospital today for this reason.    Will treat patient's gout symptoms with IV Solu-Medrol and pain medication.  Vital signs reviewed and are as follows: BP (!) 154/105   Pulse 96   Temp 99.9 F (37.7 C) (Oral)   Resp 18   Ht 5\' 9"  (1.753 m)   Wt 59 kg   SpO2 91%   BMI 19.20 kg/m   Patient's recent history, work-up, and plan was discussed and reviewed with Dr. Alvino Chapel.   11:09 AM patient doing well.  UA is negative.  Chest x-ray with residual pneumonia.  Plan for discharge home.  For gout: Patient has been given Solu-Medrol and will be given a 12-day taper course of prednisone to start tomorrow.  Encouraged PCP follow-up next week.  He is to continue chronic home pain medications.  For recent kidney dysfunction: Will be given nephrology follow-up and encouraged to call.  He is also encouraged to follow-up with PCP to ensure that this is addressed.  Recent pneumonia: We will continue to monitor.  He is likely recovering from this.  No indication for further antibiotics.  Patient is encouraged to call his doctor and schedule follow-up appointment.  We discussed signs and symptoms to return including worsening severe pain, persistent vomiting, development of fevers.  He is encouraged to continue good oral fluid intake at home.  Patient verbalizes understanding and agrees with plan today.  12:16 PM Attempted to contact patient's wife earlier per RN request. Phillip Logan, number in Epic. No answer, left VM.     MDM Rules/Calculators/A&P                          Gouty arthritis:  Involves bilateral wrists.  No temperature greater than 100.4 F while in the ED.  I have low concern for septic arthritis as it is bilateral and patient without other constitutional symptoms.  Also, he has a history of hyperuricemia, gout and symptoms were recently controlled until he came off of prednisone burst.  Patient will be restarted on steroids and encouraged to follow-up with his primary care doctor for long-term control of his gout.  Strict return instructions if he develops fever or severe uncontrolled pain.  Pain was treated with IV narcotics today.  Acute kidney injury: Recently admitted for this at [facility].  His creatinine has recovered and is near normal today.  His potassium is normal.  He does not appear fluid overloaded and his chest x-ray does not show signs of pulmonary edema.  Feel that this is compensated for now.  Will require close PCP and nephrology follow-up.  I do not feel that he requires admission for acute kidney injury or renal failure today.  He will be given nephrology follow-up and encouraged to schedule an appointment.  Recent pneumonia: Residual changes on chest x-ray.  Per hospital notes, he completed doxycycline after being discharged.  Patient is not hypoxic, febrile.  I feel that chest x-ray changes are likely residual from recent pneumonia, now improving.  Would continue to monitor, no further  treatment with antibiotics indicated.  Patient has multiple medical comorbidities which will need to be managed as an outpatient. No dangerous or life-threatening conditions suspected or identified by history, physical exam, and by work-up. No indications for hospitalization identified.    Final Clinical Impression(s) / ED Diagnoses Final diagnoses:  Gouty arthritis    Rx / DC Orders ED Discharge Orders    None       Carlisle Cater, PA-C 01/11/21 1117    Carlisle Cater, PA-C 01/11/21 1216    Davonna Belling, MD 01/11/21 [phone]

## 2021-01-11 NOTE — Discharge Instructions (Signed)
Please read and follow all provided instructions.  Your diagnoses today include:  1. Gouty arthritis     Tests performed today include: Blood cell counts (white, red, and platelets) Electrolytes  Kidney function test - shows improving kidney function, no kidney failure today Chest x-ray - shows pneumonia, but I suspect this is improving Urine test to check for infection - no infection  Vital signs. See below for your results today.   Medications prescribed:   Prednisone - steroid medicine   It is best to take this medication in the morning to prevent sleeping problems. If you are diabetic, monitor your blood sugar closely and stop taking Prednisone if blood sugar is over 300. Take with food to prevent stomach upset.   Take any prescribed medications only as directed.  Home care instructions:  Follow any educational materials contained in this packet.  BE VERY CAREFUL not to take multiple medicines containing Tylenol (also called acetaminophen). Doing so can lead to an overdose which can damage your liver and cause liver failure and possibly death.   Follow-up instructions: Please follow-up with your primary care provider in the next 3 days for further evaluation of your symptoms.  Also please call the kidney group listed and schedule follow-up appointment  Return instructions:   Please return to the Emergency Department if you experience worsening symptoms.   Return if you develop a fever above 101 F  Return if you have severe pain in your wrist or if you have redness that streaks or moves up your arm  Please return if you have any other emergent concerns.  Additional Information:  Your vital signs today were: BP (!) 146/86   Pulse 97   Temp 98.4 F (36.9 C) (Oral)   Resp 20   Ht 5\' 9"  (1.753 m)   Wt 59 kg   SpO2 91%   BMI 19.20 kg/m  If your blood pressure (BP) was elevated above 135/85 this visit, please have this repeated by your doctor within one  month. --------------

## 2021-01-11 NOTE — ED Notes (Signed)
Patient verbalizes understanding of discharge instructions. Opportunity for questioning and answers were provided. Armband removed by staff, pt discharged from ED via wheelchair to lobby to return home with spouse.   

## 2021-01-11 NOTE — ED Notes (Signed)
Pt called for recheck on VS no answer.

## 2021-01-11 NOTE — ED Notes (Cosign Needed)
Resent rx prednisone per pt request.   Carlisle Cater, PA-C 01/11/21 1601

## 2021-01-12 ENCOUNTER — Other Ambulatory Visit: Payer: Self-pay

## 2021-01-12 ENCOUNTER — Telehealth: Payer: Self-pay

## 2021-01-12 DIAGNOSIS — C44329 Squamous cell carcinoma of skin of other parts of face: Secondary | ICD-10-CM

## 2021-01-12 DIAGNOSIS — R6884 Jaw pain: Secondary | ICD-10-CM

## 2021-01-12 MED ORDER — OXYCODONE HCL 15 MG PO TABS: 15.0000 mg | ORAL_TABLET | ORAL | 0 refills | Status: DC | PRN

## 2021-01-12 NOTE — Telephone Encounter (Addendum)
I notified Mr Sedor that I would touch base with Dr Bobby Rumpf on Monday regarding appt. From Dr Bobby Rumpf' last note he plans to have PET done, & then f/u in early Feb 2022.  ----- Message from Melodye Ped, NP sent at 01/12/2021  4:20 PM EST ----- Regarding: RE: f/u appt req since hospital d/c I saw the refill request. I'm trying to figure out about that. It says refilled 2 weeks ago? Let me look at it. I know he is off treatment and [facility] wanted to start backing off. I would get him in with Bobby Rumpf because I don't know what his long term plan is for him.  ----- Message ----- From: Dairl Ponder, RN Sent: 01/12/2021   3:35 PM EST To: Melodye Ped, NP Subject: f/u appt req since hospital d/c                Pt's wife has called to request hospital f/u appt. Pt was d/c on 01/04/2021 from [facility]. I also sent a request for refill of his oxycodone. [phone]

## 2021-01-12 NOTE — Telephone Encounter (Signed)
Sent!

## 2021-01-16 ENCOUNTER — Telehealth: Payer: Self-pay | Admitting: Oncology

## 2021-01-16 NOTE — Telephone Encounter (Signed)
01/16/21 PER SCHED NOTE-Sched appt and lft msg

## 2021-01-21 NOTE — Progress Notes (Unsigned)
[facility]  [address] [place],  [place]  [address] [phone]  Clinic Day:  12/01/2020   HISTORY OF PRESENT ILLNESS:  The patient is a 75 y.o. male with recurrent/locally advanced squamous cell skin cancer.  He recently completed his definitive chemoradiation in October 2021, which consisted of 3 cycles of cisplatin.  He comes in today to go over his PET scan to ascertain his new disease baseline.  Of note, the patient was recently hospitalized with acute renal failure.  Eventually, with aggressive hydration, his renal parameters significantly improved.    PHYSICAL EXAM:  There were no vitals taken for this visit. Wt Readings from Last 3 Encounters:  01/10/21 130 lb (59 kg)  12/01/20 139 lb 14.4 oz (63.5 kg)  10/13/20 144 lb 6 oz (65.5 kg)   There is no height or weight on file to calculate BMI. Performance status (ECOG): 1 - Symptomatic but completely ambulatory Physical Exam Constitutional:      Appearance: Normal appearance. He is not ill-appearing.  HENT:     Mouth/Throat:     Mouth: Mucous membranes are moist.     Pharynx: Oropharynx is clear. No oropharyngeal exudate or posterior oropharyngeal erythema.  Cardiovascular:     Rate and Rhythm: Normal rate and regular rhythm.     Heart sounds: No murmur heard. No friction rub. No gallop.   Pulmonary:     Effort: Pulmonary effort is normal. No respiratory distress.     Breath sounds: Normal breath sounds. No wheezing, rhonchi or rales.  Chest:  Breasts:     Right: No axillary adenopathy or supraclavicular adenopathy.     Left: No axillary adenopathy or supraclavicular adenopathy.    Abdominal:     General: Bowel sounds are normal. There is no distension.     Palpations: Abdomen is soft. There is no mass.     Tenderness: There is no abdominal tenderness.  Musculoskeletal:        General: No swelling.     Right lower leg: No edema.     Left lower leg: No edema.  Lymphadenopathy:      Cervical: No cervical adenopathy.     Upper Body:     Right upper body: No supraclavicular or axillary adenopathy.     Left upper body: No supraclavicular or axillary adenopathy.     Lower Body: No right inguinal adenopathy. No left inguinal adenopathy.  Skin:    General: Skin is warm.     Coloration: Skin is not jaundiced.     Findings: No lesion or rash.  Neurological:     General: No focal deficit present.     Mental Status: He is alert and oriented to person, place, and time. Mental status is at baseline.     Cranial Nerves: Cranial nerves are intact.  Psychiatric:        Mood and Affect: Mood normal.        Behavior: Behavior normal.        Thought Content: Thought content normal.     LABS:   CBC Latest Ref Rng & Units 01/10/2021 12/01/2020 09/27/2020  WBC 4.0 - 10.5 K/uL 11.9(H) 7.3 5.3  Hemoglobin 13.0 - 17.0 g/dL 10.7(L) 9.8(A) 9.2(A)  Hematocrit 39.0 - 52.0 % 36.4(L) 30(A) 28(A)  Platelets 150 - 400 K/uL 304 333 303   CMP Latest Ref Rng & Units 01/10/2021 12/01/2020 09/27/2020  Glucose 70 - 99 mg/dL 112(H) - -  BUN 8 - 23 mg/dL 26(H)  23(A) 21  Creatinine 0.61 - 1.24 mg/dL 1.36(H) 1.1 1.0  Sodium 135 - 145 mmol/L 137 137 137  Potassium 3.5 - 5.1 mmol/L 4.3 4.4 4.8  Chloride 98 - 111 mmol/L 96(L) 100 101  CO2 22 - 32 mmol/L 28 29(A) 27(A)  Calcium 8.9 - 10.3 mg/dL 8.2(L) 8.9 9.5  Total Protein 6.5 - 8.1 g/dL 7.0 - -  Total Bilirubin 0.3 - 1.2 mg/dL 0.9 - -  Alkaline Phos 38 - 126 U/L 78 - 55  AST 15 - 41 U/L 25 - 26  ALT 0 - 44 U/L 16 - 21    ASSESSMENT & PLAN:  Assessment/Plan:  A 75 y.o. male with recurrent/locally advanced squamous cell skin cancer.  In clinic today,    The patient understands all the plans discussed today and is in agreement with them.    Kenry Daubert Macarthur Critchley, MD

## 2021-01-22 ENCOUNTER — Inpatient Hospital Stay: Payer: 59 | Admitting: Oncology

## 2021-01-23 ENCOUNTER — Telehealth: Payer: Self-pay | Admitting: Oncology

## 2021-01-23 NOTE — Telephone Encounter (Signed)
Patient scheduled for 2/8 PET Scan at [facility] - 2/9 Follow Up at 10:45 am

## 2021-01-26 ENCOUNTER — Other Ambulatory Visit: Payer: Self-pay

## 2021-01-26 DIAGNOSIS — R6884 Jaw pain: Secondary | ICD-10-CM

## 2021-01-26 DIAGNOSIS — C44329 Squamous cell carcinoma of skin of other parts of face: Secondary | ICD-10-CM

## 2021-01-26 MED ORDER — OXYCODONE HCL 15 MG PO TABS: 15.0000 mg | ORAL_TABLET | ORAL | 0 refills | Status: DC | PRN

## 2021-01-27 ENCOUNTER — Other Ambulatory Visit: Payer: Self-pay | Admitting: Oncology

## 2021-01-27 DIAGNOSIS — C44329 Squamous cell carcinoma of skin of other parts of face: Secondary | ICD-10-CM

## 2021-01-27 MED ORDER — OXYCODONE-ACETAMINOPHEN 10-325 MG PO TABS: 1.0000 | ORAL_TABLET | ORAL | 0 refills | Status: AC | PRN

## 2021-01-30 ENCOUNTER — Encounter: Payer: Self-pay | Admitting: Oncology

## 2021-01-30 LAB — GLUCOSE, POCT (MANUAL RESULT ENTRY): POC Glucose: 101 mg/dl — AB (ref 70–99)

## 2021-01-31 ENCOUNTER — Other Ambulatory Visit: Payer: Self-pay | Admitting: Oncology

## 2021-01-31 ENCOUNTER — Inpatient Hospital Stay: Payer: 59 | Attending: Oncology | Admitting: Oncology

## 2021-01-31 ENCOUNTER — Telehealth: Payer: Self-pay | Admitting: Oncology

## 2021-01-31 ENCOUNTER — Other Ambulatory Visit: Payer: Self-pay

## 2021-01-31 VITALS — BP 122/78 | HR 110 | Temp 98.0°F | Resp 16 | Ht 69.0 in | Wt 127.1 lb

## 2021-01-31 DIAGNOSIS — C44329 Squamous cell carcinoma of skin of other parts of face: Secondary | ICD-10-CM

## 2021-01-31 NOTE — Progress Notes (Signed)
[facility]  [address] [place],  [place]  [id number] [phone]  Clinic Day:  01/31/2021  HISTORY OF PRESENT ILLNESS:  The patient is a 75 y.o. male with recurrent/locally advanced squamous cell skin cancer.  He recently completed his definitive chemoradiation in October 2021, which consisted of 3 cycles of cisplatin.  He comes in today to go over his PET scan to ascertain his new disease baseline.  Since his last visit, the patient has been doing okay.  He still has a lot of pain in many joints.  Furthermore, there is swelling in his right wrist which impairs his right hand mobility.  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  PHYSICAL EXAM:  Blood pressure 122/78, pulse (!) 110, temperature 98 F (36.7 C), resp. rate 16, height 5\' 9"  (1.753 m), weight 127 lb 1.6 oz (57.7 kg), SpO2 90 %. Wt Readings from Last 3 Encounters:  01/31/21 127 lb 1.6 oz (57.7 kg)  01/10/21 130 lb (59 kg)  12/01/20 139 lb 14.4 oz (63.5 kg)   Body mass index is 18.77 kg/m. Performance status (ECOG): 1 - Symptomatic but completely ambulatory Physical Exam Constitutional:      Appearance: Normal appearance. He is not ill-appearing.  HENT:     Right Ear: Decreased hearing (absent right ear with suboptimal hearing ) noted.     Mouth/Throat:     Mouth: Mucous membranes are moist.     Pharynx: Oropharynx is clear. No oropharyngeal exudate or posterior oropharyngeal erythema.  Cardiovascular:     Rate and Rhythm: Normal rate and regular rhythm.     Heart sounds: No murmur heard. No friction rub. No gallop.   Pulmonary:     Effort: Pulmonary effort is normal. No respiratory distress.     Breath sounds: Normal breath sounds. No wheezing, rhonchi or rales.  Chest:  Breasts:     Right: No axillary adenopathy or supraclavicular adenopathy.     Left: No  axillary adenopathy or supraclavicular adenopathy.    Abdominal:     General: Bowel sounds are normal. There is no distension.     Palpations: Abdomen is soft. There is no mass.     Tenderness: There is no abdominal tenderness.  Musculoskeletal:        General: No swelling.     Right lower leg: No edema.     Left lower leg: No edema.  Lymphadenopathy:     Cervical: No cervical adenopathy.     Upper Body:     Right upper body: No supraclavicular or axillary adenopathy.     Left upper body: No supraclavicular or axillary adenopathy.     Lower Body: No right inguinal adenopathy. No left inguinal adenopathy.  Skin:    General: Skin is warm.     Coloration: Skin is not jaundiced.     Findings: No lesion or rash.  Neurological:     General: No focal deficit present.     Mental Status: He is alert and oriented to person, place, and time. Mental status is at baseline.     Cranial Nerves: Cranial nerves are intact.  Psychiatric:        Mood and Affect: Mood normal.        Behavior: Behavior normal.        Thought Content: Thought content normal.   SCANS:  His PET scan revealed the following:  FINDINGS: Mediastinal blood pool activity: SUV max 1.7  Liver activity: SUV max 2.2  NECK: Muscular activity in the neck felt to be physiologic.  Absent right ear, there appears to be a flap of tissue in the vicinity of the right ear. There is previously some activity along the posterior right TMJ margin with maximum SUV of 6.8, this has intervally resolved without substantial hypermetabolic activity in this vicinity. Postoperative findings in the right neck observed including clips along the right internal jugular chain and along the right sternocleidomastoid likely related to prior dissection. The subcutaneous tissues overlying the right sternocleidomastoid appear thinned, possibly from cutaneous flap, correlate with prior operative intervention. Absent or atrophic right  submandibular gland. Reduced delineation of fat planes along the right carotid space.  Incidental CT findings: Streak artifact along a device along the anterior margin of the right globe. Bilateral common carotid atherosclerotic calcification. 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  CHEST: Physiologic muscular activity without worrisome lesion identified. Mildly reduced bandlike density in volume loss anteriorly in the left upper lobe with only low-grade activity, maximum SUV about 1.5.  Incidental CT findings: Prominence of the azygos and hemi azygous veins. Coronary, aortic arch, and branch vessel atherosclerotic vascular disease. Small mediastinal lymph nodes are not hypermetabolic. Scattered stable reticulonodular opacities in the lungs likely from atypical infectious bronchiolitis.  ABDOMEN/PELVIS: Scattered muscular activity without hypermetabolic tumor identified.  Incidental CT findings: Aortoiliac atherosclerotic vascular disease. Infrarenal IVC clamp with venous collateral vessels. Cutaneous thickening and bandlike density in the subcutaneous tissues of the right anterior thigh with adjacent clips, query skin graft harvest site.  SKELETON: No significant abnormal hypermetabolic activity in this region.  Incidental CT findings: Multilevel cervical fusion. Mild chronic anterior wedging at L1 and L3.  IMPRESSION: 1. Postoperative findings along the right face and neck likely from skin graft/skin flap, with absent external right ear. No findings of recurrence. 2. Probable skin graft donor site in the anterior right thigh. 3. Streak artifact from a device along the anterior margin of the right globe. 4. 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 Reticulonodular opacities in the lungs are stable and likely from atypical infectious bronchiolitis. Stable bandlike  scarring in the left upper lobe. Aortic Atherosclerosis (ICD10-I70.0). Coronary atherosclerosis. Clamped infrarenal IVC with venous collaterals noted.  LABS:   CBC Latest Ref Rng & Units 01/10/2021 12/01/2020 09/27/2020  WBC 4.0 - 10.5 K/uL 11.9(H) 7.3 5.3  Hemoglobin 13.0 - 17.0 g/dL 10.7(L) 9.8(A) 9.2(A)  Hematocrit 39.0 - 52.0 % 36.4(L) 30(A) 28(A)  Platelets 150 - 400 K/uL 304 333 303   CMP Latest Ref Rng & Units 01/10/2021 12/01/2020 09/27/2020  Glucose 70 - 99 mg/dL 112(H) - -  BUN 8 - 23 mg/dL 26(H) 23(A) 21  Creatinine 0.61 - 1.24 mg/dL 1.36(H) 1.1 1.0  Sodium 135 - 145 mmol/L 137 137 137  Potassium 3.5 - 5.1 mmol/L 4.3 4.4 4.8  Chloride 98 - 111 mmol/L 96(L) 100 101  CO2 22 - 32 mmol/L 28 29(A) 27(A)  Calcium 8.9 - 10.3 mg/dL 8.2(L) 8.9 9.5  Total Protein 6.5 - 8.1 g/dL 7.0 - -  Total Bilirubin 0.3 - 1.2 mg/dL 0.9 - -  Alkaline Phos 38 - 126 U/L 78 - 55  AST 15 - 41 U/L 25 - 26  ALT 0 - 44 U/L 16 - 21    ASSESSMENT & PLAN:  Assessment/Plan:  A 75 y.o. male with recurrent/locally advanced squamous cell skin cancer.  In clinic today, I went over his PET scan images with him, for which he could see he has no evidence of disease recurrence.  Understandably, the patient was pleased with these results.  Furthermore, this gentleman's skin exam is normal.  Does appear that he is disease-free at this point time.  With respect to his squamous cell skin cancer, I will follow him with with physical exams every 4 months to ensure there remains no evidence of disease recurrence.  Currently, the patient has other pertinent medical issues, including severe arthritis.  I will have him see a rheumatologist to evaluate this problem.  As mentioned previously, this gentleman does have a swollen right wrist.  Ultimately, this wrist may need to be tapped to determine if there are any crystals within it.  The patient claims to have problems with gout, but this has never been evaluated formally.  I also  want this gentleman to see ENT due to the decreased hearing in his right side.   As mentioned previously, this gentleman had a right auriculectomy for his locally advanced skin cancer.  Furthermore, he has a muscle flap over the site where his external ear was previously, which is also likely impacting hs hearing.  Otherwise, I will see this patient back in 4 months for repeat clinical assessment.  The patient understands all the plans discussed today and is in agreement with them.  Nieves Chapa Macarthur Critchley, MD

## 2021-01-31 NOTE — Telephone Encounter (Signed)
Per 2/9 LOS, patient scheduled for June Appt's.  Gave patient Appt Summary

## 2021-02-15 ENCOUNTER — Other Ambulatory Visit: Payer: Self-pay

## 2021-02-15 ENCOUNTER — Telehealth: Payer: Self-pay

## 2021-02-15 DIAGNOSIS — C44329 Squamous cell carcinoma of skin of other parts of face: Secondary | ICD-10-CM

## 2021-02-15 DIAGNOSIS — R6884 Jaw pain: Secondary | ICD-10-CM

## 2021-02-15 MED ORDER — OXYCODONE HCL 15 MG PO TABS: 15.0000 mg | ORAL_TABLET | ORAL | 0 refills | Status: DC | PRN

## 2021-02-15 NOTE — Telephone Encounter (Addendum)
Pt notified that prescription has been sent in, and that he needs to follow up with any eye doctor. Pt verbalized understanding.   I spoke with Dr Bobby Rumpf regarding below. He recommends pt see an eye doctor for the floaters in his eyes.    I called pt to get more information. I asked pt where his pain is. He replied, "the side if my head and jaw". I asked how many pain pills was he taking daily. He replied " four, sometimes 5, sometimes 6". I then asked about the vision changes. Pt states, "I have had the floaters in my left eye. Now,when I close my left eye, I have floaters on my right eye. Did radiation cause this"? I told pt I would notify Dr Bobby Rumpf of his concerns.     Pt LVM on nurse triage line asking for refill on his pain med. He also wants to know what he can do about his eyes. He stated, "my eyes are wigging out on me. Did radiation cause this"?

## 2021-03-07 ENCOUNTER — Telehealth: Payer: Self-pay

## 2021-03-07 NOTE — Telephone Encounter (Signed)
Per Dr. Bobby Rumpf, pt needs to see PCP or present to the emergency room.  Spoke with Spouse Beth and told her his recommendation.

## 2021-03-14 ENCOUNTER — Telehealth: Payer: Self-pay

## 2021-03-14 NOTE — Telephone Encounter (Signed)
Pt's wife, Ala Bent, called to ask if we were going to send in a refill for Arnette Norris. I called her back and told her that Dr Bobby Rumpf isn't going to fill his pain med any longer. Dr Bobby Rumpf recommends that he see his PCP for pain control. Eustaquio Maize is upset, (raising her voice) asking, "why we would just stop a pain med without lowering dose first. Do you not know that he can go into withdrawal? What if his PCP won't give him the pain medication and he he took the last one last night.  I don't understand this, y'all have been giving him this pain medication for over a year. Tell Dr Bobby Rumpf to call me and cancel all of his appointments. Philip isn't coming back there to see someone who is treating him like this".  Dr Bobby Rumpf is not in clinic as of yet. I discussed above with Melissa,NP. Melissa,NP, was kind enough to speak with Beth.

## 2021-05-02 ENCOUNTER — Other Ambulatory Visit: Payer: Self-pay | Admitting: Internal Medicine

## 2021-05-02 DIAGNOSIS — N179 Acute kidney failure, unspecified: Secondary | ICD-10-CM

## 2021-05-14 ENCOUNTER — Encounter: Payer: Self-pay | Admitting: Hematology and Oncology

## 2021-05-15 ENCOUNTER — Encounter: Payer: Self-pay | Admitting: Hematology and Oncology

## 2021-05-15 ENCOUNTER — Ambulatory Visit
Admission: RE | Admit: 2021-05-15 | Discharge: 2021-05-15 | Disposition: A | Discharge status: Home / Self Care | Payer: 59 | Source: Ambulatory Visit | Attending: Internal Medicine | Admitting: Internal Medicine

## 2021-05-15 DIAGNOSIS — N179 Acute kidney failure, unspecified: Secondary | ICD-10-CM

## 2021-05-31 ENCOUNTER — Inpatient Hospital Stay: Payer: Medicare HMO

## 2021-05-31 ENCOUNTER — Inpatient Hospital Stay: Payer: Medicare HMO | Admitting: Oncology

## 2021-05-31 NOTE — Progress Notes (Signed)
[facility]  [address] [place],  [place]  [address] [phone]  Clinic Day:  06/04/2021  This document serves as a record of services personally performed by Marice Potter, MD. It was created on their behalf by Curry,Lauren E, a trained medical scribe. The creation of this record is based on the scribe's personal observations and the provider's statements to them.  HISTORY OF PRESENT ILLNESS:  The patient is a 75 y.o. male with recurrent/locally advanced squamous cell skin cancer.  He recently completed his definitive chemoradiation in October 2021, which consisted of 3 cycles of cisplatin.  He comes in today for routine follow up.  Since his last visit, the patient has been doing okay.  He claims to have a new focus of pain in his right jaw region.  He also believes the right side of his face is more swollen.    PHYSICAL EXAM:  Blood pressure (!) 180/78, pulse 89, temperature 98.3 F (36.8 C), resp. rate 18, height 5\' 9"  (1.753 m), weight 131 lb 14.4 oz (59.8 kg), SpO2 93 %. Wt Readings from Last 3 Encounters:  06/04/21 131 lb 14.4 oz (59.8 kg)  01/31/21 127 lb 1.6 oz (57.7 kg)  01/10/21 130 lb (59 kg)   Body mass index is 19.48 kg/m. Performance status (ECOG): 1 - Symptomatic but completely ambulatory Physical Exam Constitutional:      Appearance: Normal appearance. He is not ill-appearing.  HENT:     Right Ear: Decreased hearing (absent right ear with suboptimal hearing ) noted.     Mouth/Throat:     Mouth: Mucous membranes are moist.     Pharynx: Oropharynx is clear. No oropharyngeal exudate or posterior oropharyngeal erythema.  Cardiovascular:     Rate and Rhythm: Normal rate and regular rhythm.     Heart sounds: No murmur heard.   No friction rub. No gallop.  Pulmonary:     Effort: Pulmonary effort is normal. No respiratory distress.     Breath sounds: Normal breath sounds. No wheezing, rhonchi or rales.  Chest:  Breasts:     Right: No axillary adenopathy or supraclavicular adenopathy.     Left: No axillary adenopathy or supraclavicular adenopathy.  Abdominal:     General: Bowel sounds are normal. There is no distension.     Palpations: Abdomen is soft. There is no mass.     Tenderness: There is no abdominal tenderness.  Musculoskeletal:        General: No swelling.     Right lower leg: No edema.     Left lower leg: No edema.  Lymphadenopathy:     Cervical: No cervical adenopathy.     Upper Body:     Right upper body: No supraclavicular or axillary adenopathy.     Left upper body: No supraclavicular or axillary adenopathy.     Lower Body: No right inguinal adenopathy. No left inguinal adenopathy.  Skin:    General: Skin is warm.     Coloration: Skin is not jaundiced.     Findings: No lesion or rash.     Comments: Very minor swelling over his right face.  Neurological:     General: No focal deficit present.     Mental Status: He is alert and oriented to person, place, and time. Mental status is at baseline.     Cranial Nerves: Cranial nerves are intact.  Psychiatric:        Mood and Affect: Mood normal.  Behavior: Behavior normal.        Thought Content: Thought content normal.    LABS:   CBC Latest Ref Rng & Units 06/04/2021 01/10/2021 12/01/2020  WBC - 6.5 11.9(H) 7.3  Hemoglobin 13.5 - 17.5 11.1(A) 10.7(L) 9.8(A)  Hematocrit 41 - 53 34(A) 36.4(L) 30(A)  Platelets 150 - 399 403(A) 304 333   CMP Latest Ref Rng & Units 06/04/2021 01/10/2021 12/01/2020  Glucose 70 - 99 mg/dL - 112(H) -  BUN 4 - 21 32(A) 26(H) 23(A)  Creatinine 0.6 - 1.3 1.2 1.36(H) 1.1  Sodium 137 - 147 135(A) 137 137  Potassium 3.4 - 5.3 4.6 4.3 4.4  Chloride 99 - 108 101 96(L) 100  CO2 13 - 22 21 28  29(A)  Calcium 8.7 - 10.7 9.2 8.2(L) 8.9  Total Protein 6.5 - 8.1 g/dL - 7.0 -  Total Bilirubin 0.3 - 1.2 mg/dL - 0.9 -  Alkaline Phos 25 - 125 90 78 -  AST 14 - 40 30 25 -  ALT 10 - 40 16 16 -    ASSESSMENT & PLAN:   Assessment/Plan:  A 75 y.o. male with recurrent/locally advanced squamous cell skin cancer. Per his exam today, there was nothing I particularly saw or felt on exam that was concerning for disease recurrence.  Nevertheless, based upon his concern about disease recurrence potentially being present, repeat CT scans of his face/sinuses and chest will be done next week.  I will see him back a day after his scans are done to go over his results an their implications.  The patient understands all the plans discussed today and is in agreement with them.   I, Rita Ohara, am acting as scribe for Marice Potter, MD    I have reviewed this report as typed by the medical scribe, and it is complete and accurate.  Dequincy Macarthur Critchley, MD

## 2021-06-04 ENCOUNTER — Inpatient Hospital Stay: Payer: Medicare HMO | Attending: Oncology

## 2021-06-04 ENCOUNTER — Encounter: Payer: Self-pay | Admitting: Oncology

## 2021-06-04 ENCOUNTER — Other Ambulatory Visit: Payer: Self-pay | Admitting: Oncology

## 2021-06-04 ENCOUNTER — Inpatient Hospital Stay (INDEPENDENT_AMBULATORY_CARE_PROVIDER_SITE_OTHER): Payer: Medicare HMO | Admitting: Oncology

## 2021-06-04 VITALS — BP 180/78 | HR 89 | Temp 98.3°F | Resp 18 | Ht 69.0 in | Wt 131.9 lb

## 2021-06-04 DIAGNOSIS — C44329 Squamous cell carcinoma of skin of other parts of face: Secondary | ICD-10-CM | POA: Diagnosis present

## 2021-06-04 DIAGNOSIS — R6884 Jaw pain: Secondary | ICD-10-CM | POA: Insufficient documentation

## 2021-06-04 DIAGNOSIS — M25431 Effusion, right wrist: Secondary | ICD-10-CM | POA: Diagnosis not present

## 2021-06-04 DIAGNOSIS — R220 Localized swelling, mass and lump, head: Secondary | ICD-10-CM | POA: Insufficient documentation

## 2021-06-04 DIAGNOSIS — Z08 Encounter for follow-up examination after completed treatment for malignant neoplasm: Secondary | ICD-10-CM | POA: Diagnosis not present

## 2021-06-04 DIAGNOSIS — Z9221 Personal history of antineoplastic chemotherapy: Secondary | ICD-10-CM | POA: Insufficient documentation

## 2021-06-04 DIAGNOSIS — Z923 Personal history of irradiation: Secondary | ICD-10-CM | POA: Diagnosis not present

## 2021-06-04 LAB — CBC AND DIFFERENTIAL
HCT: 34 — AB (ref 41–53)
Hemoglobin: 11.1 — AB (ref 13.5–17.5)
Neutrophils Absolute: 4.1
Platelets: 403 — AB (ref 150–399)
WBC: 6.5

## 2021-06-04 LAB — CBC: RBC: 4.13 (ref 3.87–5.11)

## 2021-06-04 LAB — BASIC METABOLIC PANEL
BUN: 32 — AB (ref 4–21)
CO2: 21 (ref 13–22)
Chloride: 101 (ref 99–108)
Creatinine: 1.2 (ref 0.6–1.3)
Glucose: 108
Potassium: 4.6 (ref 3.4–5.3)
Sodium: 135 — AB (ref 137–147)

## 2021-06-04 LAB — HEPATIC FUNCTION PANEL
ALT: 16 (ref 10–40)
AST: 30 (ref 14–40)
Alkaline Phosphatase: 90 (ref 25–125)
Bilirubin, Total: 0.4

## 2021-06-04 LAB — TSH: TSH: 1.343 u[IU]/mL (ref 0.350–4.500)

## 2021-06-04 LAB — COMPREHENSIVE METABOLIC PANEL
Albumin: 4.5 (ref 3.5–5.0)
Calcium: 9.2 (ref 8.7–10.7)

## 2021-06-11 NOTE — Progress Notes (Signed)
[facility]  [address] [place],  [place]  [address] [phone]  Clinic Day:  06/14/2021  This document serves as a record of services personally performed by Marice Potter, MD. It was created on their behalf by Curry,Lauren E, a trained medical scribe. The creation of this record is based on the scribe's personal observations and the provider's statements to them.  HISTORY OF PRESENT ILLNESS:  The patient is a 75 y.o. male with recurrent/locally advanced squamous cell skin cancer.  He recently completed his definitive chemoradiation in October 2021, which consisted of 3 cycles of cisplatin.  He comes in today to go over his CT scans of his face/neck/chest, which were done as he complained of increased pain and fluctuance in his right jaw.  Since his last visit, the patient has been doing okay.  He still has occasional pain with the lateral aspects of right face.  Of note, he does have poor dentition.     PHYSICAL EXAM:  Blood pressure (!) 178/84, pulse 70, temperature 98.2 F (36.8 C), resp. rate 16, height 5\' 9"  (1.753 m), weight 134 lb 11.2 oz (61.1 kg), SpO2 93 %. Wt Readings from Last 3 Encounters:  06/14/21 134 lb 11.2 oz (61.1 kg)  06/04/21 131 lb 14.4 oz (59.8 kg)  01/31/21 127 lb 1.6 oz (57.7 kg)   Body mass index is 19.89 kg/m. Performance status (ECOG): 1 - Symptomatic but completely ambulatory Physical Exam Constitutional:      Appearance: Normal appearance. He is not ill-appearing.  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     Mouth/Throat:     Mouth: Mucous membranes are moist.     Pharynx: Oropharynx is clear. No oropharyngeal exudate or posterior oropharyngeal erythema.  Cardiovascular:     Rate and Rhythm: Normal rate and regular rhythm.     Heart sounds: No murmur heard.   No friction rub. No gallop.  Pulmonary:     Effort:  Pulmonary effort is normal. No respiratory distress.     Breath sounds: Normal breath sounds. No wheezing, rhonchi or rales.  Chest:  Breasts:    Right: No axillary adenopathy or supraclavicular adenopathy.     Left: No axillary adenopathy or supraclavicular adenopathy.  Abdominal:     General: Bowel sounds are normal. There is no distension.     Palpations: Abdomen is soft. There is no mass.     Tenderness: There is no abdominal tenderness.  Musculoskeletal:        General: No swelling.     Right hand: Swelling (chronic swelling of dorsal right wrist) present.     Right lower leg: No edema.     Left lower leg: No edema.  Lymphadenopathy:     Cervical: No cervical adenopathy.     Upper Body:     Right upper body: No supraclavicular or axillary adenopathy.     Left upper body: No supraclavicular or axillary adenopathy.     Lower Body: No right inguinal adenopathy. No left inguinal adenopathy.  Skin:    General: Skin is warm.     Coloration: Skin is not jaundiced.     Findings: No lesion or rash.     Comments: Very minor swelling over his right face.  Neurological:     General: No focal deficit present.     Mental Status: He  is alert and oriented to person, place, and time. Mental status is at baseline.     Cranial Nerves: Cranial nerves are intact.  Psychiatric:        Mood and Affect: Mood normal.        Behavior: Behavior normal.        Thought Content: Thought content normal.   SCAN RESULTS:  Recent CT imaging has revealed the following:  NECK FINDINGS: Pharynx and larynx: No pharyngeal mass or edema.  Closed glottis.  Salivary glands: Surgical resection right submandibular and parotid gland. Normal left-sided salivary glands.  Thyroid: Negative  Lymph nodes: Evaluation for lymph nodes is limited without intravenous contrast. Allowing for this, no enlarged lymph nodes identified.  Vascular: Markedly enlarged right jugular vein. Mildly distended left jugular vein.  Atherosclerotic calcification the carotid artery bilaterally.  Limited intracranial: Negative  Visualized orbits: Negative for mass. Metal implant in the right upper eyelid. Bilateral cataract extraction.  Mastoids and visualized paranasal sinuses: Paranasal sinuses clear. Mild right mastoid effusion. There is soft tissue filling the right external auditory canal. Prior surgery in this area with removal of the right ear.  Skeleton: ACDF with solid fusion C3 through C7. No acute skeletal abnormality.  Upper chest: Chest CT reported separately.  Other: Extensive surgical changes in the right neck with numerous surgical clips and removal of the right submandibular and right parotid gland. Soft tissue grafting in the right neck.  IMPRESSION: Extensive resection in the right neck with removal of right submandibular and right parotid glands. There is soft tissue filling the right external auditory canal. No change from the prior PET. This tissue was not hypermetabolic on prior PET and may be postsurgical scar tissue rather than recurrent tumor.  Markedly distended right jugular vein as well as attended left jugular vein. Possible central venous stenosis.   CHEST FINDINGS: Cardiovascular: Normal heart size. Aortic atherosclerosis. Coronary artery calcifications. No pericardial effusion. Dilated azygos and hemi azygous system as before. Low-attenuation structure within the right-side of neck extending into the right supraclavicular region is identified, image 2/4. This may be vascular in etiology.  Mediastinum/Nodes: Normal appearance of the thyroid gland. The trachea appears patent and is midline. Normal appearance of the esophagus. Similar to the previous exam. See report from CT of the neck obtained today. No left supraclavicular or bilateral axillary adenopathy. No enlarged mediastinal lymph nodes.  Lungs/Pleura: No pleural effusion, airspace consolidation, or atelectasis.  Mild centrilobular emphysema. Interval improvement in previous bilateral upper lobe airspace densities. Residual subpleural ground-glass attenuation noted in the periphery of the left upper lobe, image 45/5. Previously noted tree-in-bud opacities within both lungs have significantly improved from previous exam compatible with resolving inflammation or infection.  Periphery of left upper lobe nodule measuring 3 mm, image 51/5. Stable.  Calcified granuloma identified within the posterior left base.  Upper Abdomen: No acute abnormality within the imaged portions of the upper abdomen.  Musculoskeletal: No chest wall mass or suspicious bone lesions identified. Chronic L1 compression deformity.  IMPRESSION: 1. Interval improvement in previous bilateral upper lobe airspace densities and tree-in-bud opacities compatible with resolving inflammation or infection. 2. No specific findings identified to suggest metastatic disease within the chest. 3. Large low-attenuation structure within the right-side of neck extending into the right supraclavicular region is again noted. This may be vascular in origin. See report from CT of the neck obtained today. 4. Coronary artery calcifications noted. 5. Emphysema and aortic atherosclerosis.  Aortic Atherosclerosis (ICD10-I70.0) and Emphysema (ICD10-J43.9).   LABS:  CBC Latest Ref Rng & Units 06/04/2021 01/10/2021 12/01/2020  WBC - 6.5 11.9(H) 7.3  Hemoglobin 13.5 - 17.5 11.1(A) 10.7(L) 9.8(A)  Hematocrit 41 - 53 34(A) 36.4(L) 30(A)  Platelets 150 - 399 403(A) 304 333   CMP Latest Ref Rng & Units 06/04/2021 01/10/2021 12/01/2020  Glucose 70 - 99 mg/dL - 112(H) -  BUN 4 - 21 32(A) 26(H) 23(A)  Creatinine 0.6 - 1.3 1.2 1.36(H) 1.1  Sodium 137 - 147 135(A) 137 137  Potassium 3.4 - 5.3 4.6 4.3 4.4  Chloride 99 - 108 101 96(L) 100  CO2 13 - 22 21 28  29(A)  Calcium 8.7 - 10.7 9.2 8.2(L) 8.9  Total Protein 6.5 - 8.1 g/dL - 7.0 -  Total Bilirubin  0.3 - 1.2 mg/dL - 0.9 -  Alkaline Phos 25 - 125 90 78 -  AST 14 - 40 30 25 -  ALT 10 - 40 16 16 -    ASSESSMENT & PLAN:  Assessment/Plan:  A 75 y.o. male with recurrent/locally advanced squamous cell skin cancer. Per his exam today and recent scans, there is nothing I see which particularly concerns me for disease recurrence.  I am concerned that his poor dentition may be playing a greater role in his pain than previously anticipated.  I recommended that he see a dentist to evaluate his teeth.  On another note, this gentleman continues to have chronic right wrist swelling.  We will once again refer him to rheumatology, where his wrist may need to be tapped and checked for crystals or other findings.  Otherwise, I will see him back in 4 months for repeat clinical assessment.   The patient understands all the plans discussed today and is in agreement with them.   I, Rita Ohara, am acting as scribe for Marice Potter, MD    I have reviewed this report as typed by the medical scribe, and it is complete and accurate.  Dequincy Macarthur Critchley, MD

## 2021-06-12 ENCOUNTER — Ambulatory Visit: Payer: Medicare HMO | Admitting: Oncology

## 2021-06-14 ENCOUNTER — Other Ambulatory Visit: Payer: Self-pay | Admitting: Oncology

## 2021-06-14 ENCOUNTER — Inpatient Hospital Stay (INDEPENDENT_AMBULATORY_CARE_PROVIDER_SITE_OTHER): Payer: Medicare HMO | Admitting: Oncology

## 2021-06-14 ENCOUNTER — Other Ambulatory Visit: Payer: Self-pay

## 2021-06-14 VITALS — BP 178/84 | HR 70 | Temp 98.2°F | Resp 16 | Ht 69.0 in | Wt 134.7 lb

## 2021-06-14 DIAGNOSIS — C44329 Squamous cell carcinoma of skin of other parts of face: Secondary | ICD-10-CM

## 2021-06-15 ENCOUNTER — Telehealth: Payer: Self-pay | Admitting: Oncology

## 2021-06-15 NOTE — Telephone Encounter (Signed)
Per 6/23 los next appt scheduled.Left VM with appts

## 2021-06-18 ENCOUNTER — Encounter: Payer: Self-pay | Admitting: Oncology

## 2021-06-20 ENCOUNTER — Encounter: Payer: Self-pay | Admitting: Hematology and Oncology

## 2021-07-06 DIAGNOSIS — G629 Polyneuropathy, unspecified: Secondary | ICD-10-CM | POA: Diagnosis not present

## 2021-07-06 DIAGNOSIS — J439 Emphysema, unspecified: Secondary | ICD-10-CM | POA: Diagnosis not present

## 2021-07-06 DIAGNOSIS — I82409 Acute embolism and thrombosis of unspecified deep veins of unspecified lower extremity: Secondary | ICD-10-CM | POA: Diagnosis not present

## 2021-07-06 DIAGNOSIS — M159 Polyosteoarthritis, unspecified: Secondary | ICD-10-CM | POA: Diagnosis not present

## 2021-07-06 DIAGNOSIS — D509 Iron deficiency anemia, unspecified: Secondary | ICD-10-CM | POA: Diagnosis not present

## 2021-07-06 DIAGNOSIS — N400 Enlarged prostate without lower urinary tract symptoms: Secondary | ICD-10-CM | POA: Diagnosis not present

## 2021-07-06 DIAGNOSIS — N184 Chronic kidney disease, stage 4 (severe): Secondary | ICD-10-CM | POA: Diagnosis not present

## 2021-07-06 DIAGNOSIS — L111 Transient acantholytic dermatosis [Grover]: Secondary | ICD-10-CM | POA: Diagnosis not present

## 2021-07-06 DIAGNOSIS — I10 Essential (primary) hypertension: Secondary | ICD-10-CM | POA: Diagnosis not present

## 2021-08-07 DIAGNOSIS — J439 Emphysema, unspecified: Secondary | ICD-10-CM | POA: Diagnosis not present

## 2021-08-07 DIAGNOSIS — I82409 Acute embolism and thrombosis of unspecified deep veins of unspecified lower extremity: Secondary | ICD-10-CM | POA: Diagnosis not present

## 2021-08-07 DIAGNOSIS — L111 Transient acantholytic dermatosis [Grover]: Secondary | ICD-10-CM | POA: Diagnosis not present

## 2021-08-07 DIAGNOSIS — N400 Enlarged prostate without lower urinary tract symptoms: Secondary | ICD-10-CM | POA: Diagnosis not present

## 2021-08-07 DIAGNOSIS — D509 Iron deficiency anemia, unspecified: Secondary | ICD-10-CM | POA: Diagnosis not present

## 2021-08-07 DIAGNOSIS — I10 Essential (primary) hypertension: Secondary | ICD-10-CM | POA: Diagnosis not present

## 2021-08-07 DIAGNOSIS — G629 Polyneuropathy, unspecified: Secondary | ICD-10-CM | POA: Diagnosis not present

## 2021-08-07 DIAGNOSIS — M159 Polyosteoarthritis, unspecified: Secondary | ICD-10-CM | POA: Diagnosis not present

## 2021-08-07 DIAGNOSIS — N184 Chronic kidney disease, stage 4 (severe): Secondary | ICD-10-CM | POA: Diagnosis not present

## 2021-08-21 DIAGNOSIS — L299 Pruritus, unspecified: Secondary | ICD-10-CM | POA: Diagnosis not present

## 2021-08-21 DIAGNOSIS — L209 Atopic dermatitis, unspecified: Secondary | ICD-10-CM | POA: Diagnosis not present

## 2021-09-04 DIAGNOSIS — Z87891 Personal history of nicotine dependence: Secondary | ICD-10-CM | POA: Diagnosis not present

## 2021-09-04 DIAGNOSIS — M67431 Ganglion, right wrist: Secondary | ICD-10-CM | POA: Diagnosis not present

## 2021-09-04 DIAGNOSIS — Z1331 Encounter for screening for depression: Secondary | ICD-10-CM | POA: Diagnosis not present

## 2021-09-04 DIAGNOSIS — D485 Neoplasm of uncertain behavior of skin: Secondary | ICD-10-CM | POA: Diagnosis not present

## 2021-09-04 DIAGNOSIS — L570 Actinic keratosis: Secondary | ICD-10-CM | POA: Diagnosis not present

## 2021-09-04 DIAGNOSIS — Z139 Encounter for screening, unspecified: Secondary | ICD-10-CM | POA: Diagnosis not present

## 2021-09-04 DIAGNOSIS — I82409 Acute embolism and thrombosis of unspecified deep veins of unspecified lower extremity: Secondary | ICD-10-CM | POA: Diagnosis not present

## 2021-09-04 DIAGNOSIS — G629 Polyneuropathy, unspecified: Secondary | ICD-10-CM | POA: Diagnosis not present

## 2021-09-04 DIAGNOSIS — M159 Polyosteoarthritis, unspecified: Secondary | ICD-10-CM | POA: Diagnosis not present

## 2021-09-04 DIAGNOSIS — N184 Chronic kidney disease, stage 4 (severe): Secondary | ICD-10-CM | POA: Diagnosis not present

## 2021-09-04 DIAGNOSIS — D2239 Melanocytic nevi of other parts of face: Secondary | ICD-10-CM | POA: Diagnosis not present

## 2021-09-04 DIAGNOSIS — L209 Atopic dermatitis, unspecified: Secondary | ICD-10-CM | POA: Diagnosis not present

## 2021-09-04 DIAGNOSIS — J439 Emphysema, unspecified: Secondary | ICD-10-CM | POA: Diagnosis not present

## 2021-09-18 DIAGNOSIS — L570 Actinic keratosis: Secondary | ICD-10-CM | POA: Diagnosis not present

## 2021-09-18 DIAGNOSIS — L209 Atopic dermatitis, unspecified: Secondary | ICD-10-CM | POA: Diagnosis not present

## 2021-09-24 ENCOUNTER — Telehealth: Payer: Self-pay

## 2021-09-24 NOTE — Telephone Encounter (Signed)
Dr Bobby Rumpf agrees to see pt sooner than already scheduled 10/16/21 appt. Appt given for this Thursday @230  pm for labs, 3p Dr Bobby Rumpf.

## 2021-09-26 DIAGNOSIS — M19131 Post-traumatic osteoarthritis, right wrist: Secondary | ICD-10-CM | POA: Diagnosis not present

## 2021-09-26 NOTE — Progress Notes (Signed)
[facility]  [address] [place],  [place]  [address] [phone]  Clinic Day:  06/14/2021  This document serves as a record of services personally performed by Marice Potter, MD. It was created on their behalf by Curry,Lauren E, a trained medical scribe. The creation of this record is based on the scribe's personal observations and the provider's statements to them.  HISTORY OF PRESENT ILLNESS:  The patient is a 75 y.o. male with recurrent/locally advanced squamous cell skin cancer.  He completed his definitive chemoradiation in October 2021, which consisted of 3 cycles of cisplatin.  He comes in today off-schedule as a lesion developed just posterior to his muscle flap covering over his right ear.  Initially, he was concerned this lesion was a tumor, but overnight, it ruptured, with purulent drainage being appreciated.  After seeing this occur, he is no longer concerned about this lesion being recurrent skin cancer.  He still comes in today to have some antibiotics to prevent this area from getting worse.                       PHYSICAL EXAM:  Blood pressure (!) 168/94, pulse 88, temperature 97.8 F (36.6 C), resp. rate 16, height 5\' 9"  (1.753 m), weight 146 lb 14.4 oz (66.6 kg), SpO2 91 %. Wt Readings from Last 3 Encounters:  09/27/21 146 lb 14.4 oz (66.6 kg)  06/14/21 134 lb 11.2 oz (61.1 kg)  06/04/21 131 lb 14.4 oz (59.8 kg)   Body mass index is 21.69 kg/m. Performance status (ECOG): 1 - Symptomatic but completely ambulatory Physical Exam Constitutional:      Appearance: Normal appearance. He is not ill-appearing.  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     Mouth/Throat:     Mouth: Mucous membranes are moist.     Pharynx: Oropharynx is clear. No oropharyngeal exudate or posterior oropharyngeal erythema.  Cardiovascular:     Rate and Rhythm:  Normal rate and regular rhythm.     Heart sounds: No murmur heard.   No friction rub. No gallop.  Pulmonary:     Effort: Pulmonary effort is normal. No respiratory distress.     Breath sounds: Normal breath sounds. No wheezing, rhonchi or rales.  Abdominal:     General: Bowel sounds are normal. There is no distension.     Palpations: Abdomen is soft. There is no mass.     Tenderness: There is no abdominal tenderness.  Musculoskeletal:        General: No swelling.     Right hand: Swelling (chronic swelling of dorsal right wrist) present.     Right lower leg: No edema.     Left lower leg: No edema.  Lymphadenopathy:     Cervical: No cervical adenopathy.     Upper Body:     Right upper body: No supraclavicular or axillary adenopathy.     Left upper body: No supraclavicular or axillary adenopathy.     Lower Body: No right inguinal adenopathy. No left inguinal adenopathy.  Skin:    General: Skin is warm.     Coloration: Skin is not jaundiced.     Findings: Lesion (Ruptured pustular lesion posterior to muscle flap, now with only serosanguenous discharge) present. No rash.  Neurological:     General: No focal deficit present.  Mental Status: He is alert and oriented to person, place, and time. Mental status is at baseline.     Cranial Nerves: Cranial nerves are intact.  Psychiatric:        Mood and Affect: Mood normal.        Behavior: Behavior normal.        Thought Content: Thought content normal.   LABS:   CBC Latest Ref Rng & Units 09/27/2021 06/04/2021 01/10/2021  WBC - 5.2 6.5 11.9(H)  Hemoglobin 13.5 - 17.5 12.0(A) 11.1(A) 10.7(L)  Hematocrit 41 - 53 37(A) 34(A) 36.4(L)  Platelets 150 - 399 391 403(A) 304   CMP Latest Ref Rng & Units 09/27/2021 06/04/2021 01/10/2021  Glucose 70 - 99 mg/dL - - 112(H)  BUN 4 - 21 19 32(A) 26(H)  Creatinine 0.6 - 1.3 1.1 1.2 1.36(H)  Sodium 137 - 147 129(A) 135(A) 137  Potassium 3.4 - 5.3 3.9 4.6 4.3  Chloride 99 - 108 91(A) 101 96(L)  CO2  13 - 22 26(A) 21 28  Calcium 8.7 - 10.7 9.1 9.2 8.2(L)  Total Protein 6.5 - 8.1 g/dL - - 7.0  Total Bilirubin 0.3 - 1.2 mg/dL - - 0.9  Alkaline Phos 25 - 125 96 90 78  AST 14 - 40 29 30 25   ALT 10 - 40 14 16 16     ASSESSMENT & PLAN:  Assessment/Plan:  A 75 y.o. male with recurrent/locally advanced squamous cell skin cancer. Per his exam today, there is no evidence of disease recurrence.  The patient understands this is only a focal skin infection.  I will prescribe him Keflex 500 mg TID x 5 days to hasten the eradication of this focal infection.  Otherwise, as he is doing well, I will see him back in 4 months for repeat clinical assessment.  The patient understands all the plans discussed today and is in agreement with them.   I, Rita Ohara, am acting as scribe for Marice Potter, MD    I have reviewed this report as typed by the medical scribe, and it is complete and accurate.  Hilbert Briggs Macarthur Critchley, MD

## 2021-09-27 ENCOUNTER — Inpatient Hospital Stay: Payer: Medicare HMO

## 2021-09-27 ENCOUNTER — Other Ambulatory Visit: Payer: Self-pay | Admitting: Oncology

## 2021-09-27 ENCOUNTER — Inpatient Hospital Stay: Payer: Medicare HMO | Attending: Oncology | Admitting: Oncology

## 2021-09-27 ENCOUNTER — Other Ambulatory Visit: Payer: Self-pay

## 2021-09-27 ENCOUNTER — Other Ambulatory Visit: Payer: Self-pay | Admitting: Hematology and Oncology

## 2021-09-27 VITALS — BP 168/94 | HR 88 | Temp 97.8°F | Resp 16 | Ht 69.0 in | Wt 146.9 lb

## 2021-09-27 DIAGNOSIS — L02821 Furuncle of head [any part, except face]: Secondary | ICD-10-CM

## 2021-09-27 DIAGNOSIS — C44329 Squamous cell carcinoma of skin of other parts of face: Secondary | ICD-10-CM

## 2021-09-27 DIAGNOSIS — D649 Anemia, unspecified: Secondary | ICD-10-CM | POA: Diagnosis not present

## 2021-09-27 LAB — HEPATIC FUNCTION PANEL
ALT: 14 (ref 10–40)
AST: 29 (ref 14–40)
Alkaline Phosphatase: 96 (ref 25–125)
Bilirubin, Total: 0.4

## 2021-09-27 LAB — BASIC METABOLIC PANEL
BUN: 19 (ref 4–21)
CO2: 26 — AB (ref 13–22)
Chloride: 91 — AB (ref 99–108)
Creatinine: 1.1 (ref 0.6–1.3)
Glucose: 169
Potassium: 3.9 (ref 3.4–5.3)
Sodium: 129 — AB (ref 137–147)

## 2021-09-27 LAB — CBC AND DIFFERENTIAL
HCT: 37 — AB (ref 41–53)
Hemoglobin: 12 — AB (ref 13.5–17.5)
Neutrophils Absolute: 3.43
Platelets: 391 (ref 150–399)
WBC: 5.2

## 2021-09-27 LAB — COMPREHENSIVE METABOLIC PANEL
Albumin: 4.4 (ref 3.5–5.0)
Calcium: 9.1 (ref 8.7–10.7)

## 2021-09-27 LAB — CBC: RBC: 4.52 (ref 3.87–5.11)

## 2021-09-27 MED ORDER — CEPHALEXIN 500 MG PO CAPS: 500.0000 mg | ORAL_CAPSULE | Freq: Three times a day (TID) | ORAL | 0 refills | Status: DC

## 2021-10-01 ENCOUNTER — Encounter: Payer: Self-pay | Admitting: Hematology and Oncology

## 2021-10-01 DIAGNOSIS — L209 Atopic dermatitis, unspecified: Secondary | ICD-10-CM | POA: Diagnosis not present

## 2021-10-01 DIAGNOSIS — L02821 Furuncle of head [any part, except face]: Secondary | ICD-10-CM | POA: Insufficient documentation

## 2021-10-02 DIAGNOSIS — Z87891 Personal history of nicotine dependence: Secondary | ICD-10-CM | POA: Diagnosis not present

## 2021-10-02 DIAGNOSIS — I82409 Acute embolism and thrombosis of unspecified deep veins of unspecified lower extremity: Secondary | ICD-10-CM | POA: Diagnosis not present

## 2021-10-02 DIAGNOSIS — J439 Emphysema, unspecified: Secondary | ICD-10-CM | POA: Diagnosis not present

## 2021-10-02 DIAGNOSIS — H52223 Regular astigmatism, bilateral: Secondary | ICD-10-CM | POA: Diagnosis not present

## 2021-10-02 DIAGNOSIS — N184 Chronic kidney disease, stage 4 (severe): Secondary | ICD-10-CM | POA: Diagnosis not present

## 2021-10-02 DIAGNOSIS — Z0100 Encounter for examination of eyes and vision without abnormal findings: Secondary | ICD-10-CM | POA: Diagnosis not present

## 2021-10-02 DIAGNOSIS — G629 Polyneuropathy, unspecified: Secondary | ICD-10-CM | POA: Diagnosis not present

## 2021-10-02 DIAGNOSIS — I700 Atherosclerosis of aorta: Secondary | ICD-10-CM | POA: Diagnosis not present

## 2021-10-02 DIAGNOSIS — L111 Transient acantholytic dermatosis [Grover]: Secondary | ICD-10-CM | POA: Diagnosis not present

## 2021-10-02 DIAGNOSIS — M25511 Pain in right shoulder: Secondary | ICD-10-CM | POA: Diagnosis not present

## 2021-10-02 DIAGNOSIS — M159 Polyosteoarthritis, unspecified: Secondary | ICD-10-CM | POA: Diagnosis not present

## 2021-10-15 ENCOUNTER — Telehealth: Payer: Self-pay

## 2021-10-15 DIAGNOSIS — L209 Atopic dermatitis, unspecified: Secondary | ICD-10-CM | POA: Diagnosis not present

## 2021-10-15 NOTE — Telephone Encounter (Signed)
Per Dr. Bobby Rumpf, he does not need to be seen tomorrow.  He saw him on 09/27/2021.  Pt needs follow up appt 4 months from 09/27/2021.  I sent secure chat to [facility]. Cancel and reschedule in 4 months.

## 2021-10-16 ENCOUNTER — Ambulatory Visit: Payer: Medicare HMO | Admitting: Oncology

## 2021-10-16 ENCOUNTER — Other Ambulatory Visit: Payer: Medicare HMO

## 2021-10-18 DIAGNOSIS — N184 Chronic kidney disease, stage 4 (severe): Secondary | ICD-10-CM | POA: Diagnosis not present

## 2021-10-18 DIAGNOSIS — M159 Polyosteoarthritis, unspecified: Secondary | ICD-10-CM | POA: Diagnosis not present

## 2021-10-18 DIAGNOSIS — J439 Emphysema, unspecified: Secondary | ICD-10-CM | POA: Diagnosis not present

## 2021-10-18 DIAGNOSIS — I700 Atherosclerosis of aorta: Secondary | ICD-10-CM | POA: Diagnosis not present

## 2021-10-18 DIAGNOSIS — G629 Polyneuropathy, unspecified: Secondary | ICD-10-CM | POA: Diagnosis not present

## 2021-10-18 DIAGNOSIS — I82409 Acute embolism and thrombosis of unspecified deep veins of unspecified lower extremity: Secondary | ICD-10-CM | POA: Diagnosis not present

## 2021-10-18 DIAGNOSIS — Z87891 Personal history of nicotine dependence: Secondary | ICD-10-CM | POA: Diagnosis not present

## 2021-10-18 DIAGNOSIS — I10 Essential (primary) hypertension: Secondary | ICD-10-CM | POA: Diagnosis not present

## 2021-10-18 DIAGNOSIS — H6691 Otitis media, unspecified, right ear: Secondary | ICD-10-CM | POA: Diagnosis not present

## 2021-10-19 DIAGNOSIS — G629 Polyneuropathy, unspecified: Secondary | ICD-10-CM | POA: Diagnosis not present

## 2021-10-19 DIAGNOSIS — M25331 Other instability, right wrist: Secondary | ICD-10-CM | POA: Diagnosis not present

## 2021-10-25 DIAGNOSIS — G629 Polyneuropathy, unspecified: Secondary | ICD-10-CM | POA: Diagnosis not present

## 2021-10-25 DIAGNOSIS — M5412 Radiculopathy, cervical region: Secondary | ICD-10-CM | POA: Diagnosis not present

## 2021-10-29 DIAGNOSIS — L209 Atopic dermatitis, unspecified: Secondary | ICD-10-CM | POA: Diagnosis not present

## 2021-10-29 DIAGNOSIS — L570 Actinic keratosis: Secondary | ICD-10-CM | POA: Diagnosis not present

## 2021-11-02 DIAGNOSIS — J439 Emphysema, unspecified: Secondary | ICD-10-CM | POA: Diagnosis not present

## 2021-11-02 DIAGNOSIS — M159 Polyosteoarthritis, unspecified: Secondary | ICD-10-CM | POA: Diagnosis not present

## 2021-11-02 DIAGNOSIS — G629 Polyneuropathy, unspecified: Secondary | ICD-10-CM | POA: Diagnosis not present

## 2021-11-02 DIAGNOSIS — Z87891 Personal history of nicotine dependence: Secondary | ICD-10-CM | POA: Diagnosis not present

## 2021-11-02 DIAGNOSIS — L111 Transient acantholytic dermatosis [Grover]: Secondary | ICD-10-CM | POA: Diagnosis not present

## 2021-11-02 DIAGNOSIS — N400 Enlarged prostate without lower urinary tract symptoms: Secondary | ICD-10-CM | POA: Diagnosis not present

## 2021-11-02 DIAGNOSIS — N184 Chronic kidney disease, stage 4 (severe): Secondary | ICD-10-CM | POA: Diagnosis not present

## 2021-11-02 DIAGNOSIS — I82409 Acute embolism and thrombosis of unspecified deep veins of unspecified lower extremity: Secondary | ICD-10-CM | POA: Diagnosis not present

## 2021-11-02 DIAGNOSIS — M19031 Primary osteoarthritis, right wrist: Secondary | ICD-10-CM | POA: Diagnosis not present

## 2021-11-02 DIAGNOSIS — I10 Essential (primary) hypertension: Secondary | ICD-10-CM | POA: Diagnosis not present

## 2021-11-05 DIAGNOSIS — M5412 Radiculopathy, cervical region: Secondary | ICD-10-CM | POA: Diagnosis not present

## 2021-11-05 DIAGNOSIS — M25331 Other instability, right wrist: Secondary | ICD-10-CM | POA: Diagnosis not present

## 2021-11-09 DIAGNOSIS — Z20822 Contact with and (suspected) exposure to covid-19: Secondary | ICD-10-CM | POA: Diagnosis not present

## 2021-11-26 DIAGNOSIS — L209 Atopic dermatitis, unspecified: Secondary | ICD-10-CM | POA: Diagnosis not present

## 2021-11-30 DIAGNOSIS — M25552 Pain in left hip: Secondary | ICD-10-CM | POA: Diagnosis not present

## 2021-11-30 DIAGNOSIS — N184 Chronic kidney disease, stage 4 (severe): Secondary | ICD-10-CM | POA: Diagnosis not present

## 2021-11-30 DIAGNOSIS — G629 Polyneuropathy, unspecified: Secondary | ICD-10-CM | POA: Diagnosis not present

## 2021-11-30 DIAGNOSIS — N400 Enlarged prostate without lower urinary tract symptoms: Secondary | ICD-10-CM | POA: Diagnosis not present

## 2021-11-30 DIAGNOSIS — M159 Polyosteoarthritis, unspecified: Secondary | ICD-10-CM | POA: Diagnosis not present

## 2021-11-30 DIAGNOSIS — I82409 Acute embolism and thrombosis of unspecified deep veins of unspecified lower extremity: Secondary | ICD-10-CM | POA: Diagnosis not present

## 2021-11-30 DIAGNOSIS — L111 Transient acantholytic dermatosis [Grover]: Secondary | ICD-10-CM | POA: Diagnosis not present

## 2021-11-30 DIAGNOSIS — Z87891 Personal history of nicotine dependence: Secondary | ICD-10-CM | POA: Diagnosis not present

## 2021-11-30 DIAGNOSIS — J439 Emphysema, unspecified: Secondary | ICD-10-CM | POA: Diagnosis not present

## 2021-12-11 DIAGNOSIS — L209 Atopic dermatitis, unspecified: Secondary | ICD-10-CM | POA: Diagnosis not present

## 2021-12-25 DIAGNOSIS — G629 Polyneuropathy, unspecified: Secondary | ICD-10-CM | POA: Diagnosis not present

## 2021-12-25 DIAGNOSIS — N400 Enlarged prostate without lower urinary tract symptoms: Secondary | ICD-10-CM | POA: Diagnosis not present

## 2021-12-25 DIAGNOSIS — N184 Chronic kidney disease, stage 4 (severe): Secondary | ICD-10-CM | POA: Diagnosis not present

## 2021-12-25 DIAGNOSIS — I10 Essential (primary) hypertension: Secondary | ICD-10-CM | POA: Diagnosis not present

## 2021-12-25 DIAGNOSIS — I82409 Acute embolism and thrombosis of unspecified deep veins of unspecified lower extremity: Secondary | ICD-10-CM | POA: Diagnosis not present

## 2021-12-25 DIAGNOSIS — M159 Polyosteoarthritis, unspecified: Secondary | ICD-10-CM | POA: Diagnosis not present

## 2021-12-25 DIAGNOSIS — Z87891 Personal history of nicotine dependence: Secondary | ICD-10-CM | POA: Diagnosis not present

## 2021-12-25 DIAGNOSIS — J439 Emphysema, unspecified: Secondary | ICD-10-CM | POA: Diagnosis not present

## 2021-12-25 DIAGNOSIS — L111 Transient acantholytic dermatosis [Grover]: Secondary | ICD-10-CM | POA: Diagnosis not present

## 2022-01-01 DIAGNOSIS — L570 Actinic keratosis: Secondary | ICD-10-CM | POA: Diagnosis not present

## 2022-01-01 DIAGNOSIS — L209 Atopic dermatitis, unspecified: Secondary | ICD-10-CM | POA: Diagnosis not present

## 2022-01-01 DIAGNOSIS — L299 Pruritus, unspecified: Secondary | ICD-10-CM | POA: Diagnosis not present

## 2022-01-01 DIAGNOSIS — D485 Neoplasm of uncertain behavior of skin: Secondary | ICD-10-CM | POA: Diagnosis not present

## 2022-01-22 DIAGNOSIS — N400 Enlarged prostate without lower urinary tract symptoms: Secondary | ICD-10-CM | POA: Diagnosis not present

## 2022-01-22 DIAGNOSIS — N184 Chronic kidney disease, stage 4 (severe): Secondary | ICD-10-CM | POA: Diagnosis not present

## 2022-01-22 DIAGNOSIS — J439 Emphysema, unspecified: Secondary | ICD-10-CM | POA: Diagnosis not present

## 2022-01-22 DIAGNOSIS — M159 Polyosteoarthritis, unspecified: Secondary | ICD-10-CM | POA: Diagnosis not present

## 2022-01-22 DIAGNOSIS — Z87891 Personal history of nicotine dependence: Secondary | ICD-10-CM | POA: Diagnosis not present

## 2022-01-22 DIAGNOSIS — G629 Polyneuropathy, unspecified: Secondary | ICD-10-CM | POA: Diagnosis not present

## 2022-01-22 DIAGNOSIS — I10 Essential (primary) hypertension: Secondary | ICD-10-CM | POA: Diagnosis not present

## 2022-01-22 DIAGNOSIS — I82409 Acute embolism and thrombosis of unspecified deep veins of unspecified lower extremity: Secondary | ICD-10-CM | POA: Diagnosis not present

## 2022-01-22 DIAGNOSIS — L111 Transient acantholytic dermatosis [Grover]: Secondary | ICD-10-CM | POA: Diagnosis not present

## 2022-01-22 NOTE — Progress Notes (Signed)
[facility]  [address] [place],  [place]  [address] [phone]  Clinic Day:  01/28/2022  This document serves as a record of services personally performed by Marice Potter, MD. It was created on their behalf by Curry,Lauren E, a trained medical scribe. The creation of this record is based on the scribe's personal observations and the provider's statements to them.  HISTORY OF PRESENT ILLNESS:  The patient is a 76 y.o. male with recurrent/locally advanced squamous cell skin cancer.  He completed his definitive chemoradiation in October 2021, which consisted of 3 cycles of cisplatin.  He comes in today for repeat clinical assessment. Since his last visit, the patient has been doing okay.  He still has occasional pain with the lateral aspects of right face.  Of note, squamous cell skin cancer was just removed from his forehead last week.  He denies having any new symptoms or findings which concern him for more advanced disease being present.  PHYSICAL EXAM:  Blood pressure (!) 146/70, pulse 91, temperature 97.7 F (36.5 C), resp. rate 18, height 5\' 9"  (1.753 m), weight 153 lb 4.8 oz (69.5 kg), SpO2 92 %. Wt Readings from Last 3 Encounters:  01/28/22 153 lb 4.8 oz (69.5 kg)  09/27/21 146 lb 14.4 oz (66.6 kg)  06/14/21 134 lb 11.2 oz (61.1 kg)   Body mass index is 22.64 kg/m. Performance status (ECOG): 1 - Symptomatic but completely ambulatory Physical Exam Constitutional:      General: He is not in acute distress.    Appearance: Normal appearance. He is normal weight.  HENT:     Head: Normocephalic and atraumatic.  Eyes:     General: No scleral icterus.    Extraocular Movements: Extraocular movements intact.     Conjunctiva/sclera: Conjunctivae normal.     Pupils: Pupils are equal, round, and reactive to light.  Cardiovascular:     Rate and Rhythm: Normal rate and regular rhythm.     Pulses: Normal pulses.     Heart sounds: Normal heart  sounds. No murmur heard.   No friction rub. No gallop.  Pulmonary:     Effort: Pulmonary effort is normal. No respiratory distress.     Breath sounds: Normal breath sounds.  Abdominal:     General: Bowel sounds are normal. There is no distension.     Palpations: Abdomen is soft. There is no hepatomegaly, splenomegaly or mass.     Tenderness: There is no abdominal tenderness.  Musculoskeletal:        General: Normal range of motion.     Cervical back: Normal range of motion and neck supple.     Right lower leg: No edema.     Left lower leg: No edema.  Lymphadenopathy:     Cervical: No cervical adenopathy.  Skin:    General: Skin is warm and dry.  Neurological:     General: No focal deficit present.     Mental Status: He is alert and oriented to person, place, and time. Mental status is at baseline.  Psychiatric:        Mood and Affect: Mood normal.        Behavior: Behavior normal.        Thought Content: Thought content normal.        Judgment: Judgment normal.   LABS:   CBC Latest Ref Rng & Units 01/28/2022 09/27/2021 06/04/2021  WBC - 4.8 5.2 6.5  Hemoglobin 13.5 - 17.5 11.8(A) 12.0(A) 11.1(A)  Hematocrit 41 - 53 37(A) 37(A) 34(A)  Platelets 150 - 399 289 391 403(A)   CMP Latest Ref Rng & Units 01/28/2022 09/27/2021 06/04/2021  Glucose 70 - 99 mg/dL - - -  BUN 4 - 21 24(A) 19 32(A)  Creatinine 0.6 - 1.3 1.4(A) 1.1 1.2  Sodium 137 - 147 135(A) 129(A) 135(A)  Potassium 3.4 - 5.3 4.3 3.9 4.6  Chloride 99 - 108 98(A) 91(A) 101  CO2 13 - 22 28(A) 26(A) 21  Calcium 8.7 - 10.7 8.9 9.1 9.2  Total Protein 6.5 - 8.1 g/dL - - -  Total Bilirubin 0.3 - 1.2 mg/dL - - -  Alkaline Phos 25 - 125 97 96 90  AST 14 - 40 32 29 30  ALT 10 - 40 17 14 16     Latest Reference Range & Units 01/28/22 11:22  Iron 45 - 182 ug/dL 62  UIBC ug/dL 453  TIBC 250 - 450 ug/dL 515 (H)  Saturation Ratios 17.9 - 39.5 % 12 (L)  Ferritin 24 - 336 ng/mL 8 (L)  (H): Data is abnormally high (L): Data is  abnormally low  ASSESSMENT & PLAN:  Assessment/Plan:  A 76 y.o. male with recurrent/locally advanced squamous cell skin cancer.  Per his physical exam today, there remain no signs of disease recurrence.  Clinically, he appears to be doing okay.  On another note, this gentleman's hemoglobin today is lower than what it has been previously.  His iron parameters today definitely suggest this gentleman is iron deficient.  Based upon this, I will arrange for him to receive IV iron in the forthcoming weeks to replenish his iron stores and normalize his hemoglobin.  I will also refer him to GI to ensure his iron deficiency anemia is not related to any ominous occult GI tract pathology.  Otherwise, I will see him back in 4 months for repeat clinical assessment.  The patient understands all the plans discussed today and is in agreement with them.   I, Rita Ohara, am acting as scribe for Marice Potter, MD    I have reviewed this report as typed by the medical scribe, and it is complete and accurate.  Dequincy Macarthur Critchley, MD

## 2022-01-24 DIAGNOSIS — C44329 Squamous cell carcinoma of skin of other parts of face: Secondary | ICD-10-CM | POA: Diagnosis not present

## 2022-01-24 DIAGNOSIS — L570 Actinic keratosis: Secondary | ICD-10-CM | POA: Diagnosis not present

## 2022-01-28 ENCOUNTER — Inpatient Hospital Stay: Payer: Medicare HMO | Attending: Oncology

## 2022-01-28 ENCOUNTER — Inpatient Hospital Stay: Payer: Medicare HMO | Admitting: Oncology

## 2022-01-28 ENCOUNTER — Encounter: Payer: Self-pay | Admitting: Oncology

## 2022-01-28 ENCOUNTER — Other Ambulatory Visit: Payer: Self-pay

## 2022-01-28 ENCOUNTER — Other Ambulatory Visit: Payer: Self-pay | Admitting: Oncology

## 2022-01-28 DIAGNOSIS — D649 Anemia, unspecified: Secondary | ICD-10-CM | POA: Diagnosis not present

## 2022-01-28 DIAGNOSIS — C44329 Squamous cell carcinoma of skin of other parts of face: Secondary | ICD-10-CM

## 2022-01-28 DIAGNOSIS — D509 Iron deficiency anemia, unspecified: Secondary | ICD-10-CM | POA: Insufficient documentation

## 2022-01-28 DIAGNOSIS — D508 Other iron deficiency anemias: Secondary | ICD-10-CM

## 2022-01-28 DIAGNOSIS — Z79899 Other long term (current) drug therapy: Secondary | ICD-10-CM | POA: Insufficient documentation

## 2022-01-28 LAB — BASIC METABOLIC PANEL
BUN: 24 — AB (ref 4–21)
CO2: 28 — AB (ref 13–22)
Chloride: 98 — AB (ref 99–108)
Creatinine: 1.4 — AB (ref 0.6–1.3)
Glucose: 116
Potassium: 4.3 (ref 3.4–5.3)
Sodium: 135 — AB (ref 137–147)

## 2022-01-28 LAB — CBC AND DIFFERENTIAL
HCT: 37 — AB (ref 41–53)
Hemoglobin: 11.8 — AB (ref 13.5–17.5)
Neutrophils Absolute: 3.12
Platelets: 289 (ref 150–399)
WBC: 4.8

## 2022-01-28 LAB — IRON AND TIBC
Iron: 62 ug/dL (ref 45–182)
Saturation Ratios: 12 % — ABNORMAL LOW (ref 17.9–39.5)
TIBC: 515 ug/dL — ABNORMAL HIGH (ref 250–450)
UIBC: 453 ug/dL

## 2022-01-28 LAB — COMPREHENSIVE METABOLIC PANEL
Albumin: 4.3 (ref 3.5–5.0)
Calcium: 8.9 (ref 8.7–10.7)

## 2022-01-28 LAB — HEPATIC FUNCTION PANEL
ALT: 17 (ref 10–40)
AST: 32 (ref 14–40)
Alkaline Phosphatase: 97 (ref 25–125)
Bilirubin, Total: 0.6

## 2022-01-28 LAB — CBC: RBC: 4.5 (ref 3.87–5.11)

## 2022-01-28 LAB — TSH: TSH: 2.226 u[IU]/mL (ref 0.350–4.500)

## 2022-01-28 LAB — FERRITIN: Ferritin: 8 ng/mL — ABNORMAL LOW (ref 24–336)

## 2022-01-30 ENCOUNTER — Telehealth: Payer: Self-pay | Admitting: Oncology

## 2022-01-30 ENCOUNTER — Other Ambulatory Visit: Payer: Self-pay | Admitting: Oncology

## 2022-01-30 NOTE — Telephone Encounter (Signed)
01/30/22 lft msg about upcoming iron appts

## 2022-02-04 ENCOUNTER — Ambulatory Visit: Payer: Medicare HMO

## 2022-02-08 ENCOUNTER — Telehealth: Payer: Self-pay | Admitting: Oncology

## 2022-02-08 ENCOUNTER — Encounter: Payer: Self-pay | Admitting: Oncology

## 2022-02-08 NOTE — Telephone Encounter (Signed)
Unable to reach via phone, voicemail was left.  Entered by Juanetta Beets on 02/08/2022 at 10:13 AM Priority: High <No visit type provided>  Department: [facility]  Provider:   Appointment Notes:  Pt missed his first iron infusion on 2/13 and we need to know if he plans on coming for the 2nd infusion on 2/20.  Will you please call him and confirm this?  Also he does plan on coming, he will need another appt for the 2nd infusion.

## 2022-02-11 ENCOUNTER — Other Ambulatory Visit: Payer: Self-pay

## 2022-02-11 ENCOUNTER — Inpatient Hospital Stay: Payer: Medicare HMO

## 2022-02-11 VITALS — BP 116/68 | HR 89 | Temp 98.0°F | Resp 18 | Ht 69.0 in | Wt 150.1 lb

## 2022-02-11 DIAGNOSIS — D509 Iron deficiency anemia, unspecified: Secondary | ICD-10-CM | POA: Diagnosis not present

## 2022-02-11 DIAGNOSIS — Z79899 Other long term (current) drug therapy: Secondary | ICD-10-CM | POA: Diagnosis not present

## 2022-02-11 MED ORDER — SODIUM CHLORIDE 0.9 % IV SOLN: Freq: Once | INTRAVENOUS | Status: AC

## 2022-02-11 MED ORDER — SODIUM CHLORIDE 0.9 % IV SOLN
510.0000 mg | Freq: Once | INTRAVENOUS | Status: AC
  Administered 2022-02-11: 510 mg via INTRAVENOUS
  Filled 2022-02-11: qty 17

## 2022-02-11 NOTE — Patient Instructions (Signed)

## 2022-02-12 DIAGNOSIS — D485 Neoplasm of uncertain behavior of skin: Secondary | ICD-10-CM | POA: Diagnosis not present

## 2022-02-12 DIAGNOSIS — L309 Dermatitis, unspecified: Secondary | ICD-10-CM | POA: Diagnosis not present

## 2022-02-12 DIAGNOSIS — L300 Nummular dermatitis: Secondary | ICD-10-CM | POA: Diagnosis not present

## 2022-02-12 DIAGNOSIS — L299 Pruritus, unspecified: Secondary | ICD-10-CM | POA: Diagnosis not present

## 2022-02-15 MED FILL — Ferumoxytol Inj 510 MG/17ML (30 MG/ML) (Elemental Fe): INTRAVENOUS | Qty: 17 | Status: AC

## 2022-02-18 ENCOUNTER — Other Ambulatory Visit: Payer: Self-pay

## 2022-02-18 ENCOUNTER — Inpatient Hospital Stay: Payer: Medicare HMO

## 2022-02-18 VITALS — BP 155/73 | HR 79 | Temp 97.8°F | Resp 18 | Ht 65.75 in | Wt 151.0 lb

## 2022-02-18 DIAGNOSIS — D509 Iron deficiency anemia, unspecified: Secondary | ICD-10-CM | POA: Diagnosis not present

## 2022-02-18 DIAGNOSIS — Z79899 Other long term (current) drug therapy: Secondary | ICD-10-CM | POA: Diagnosis not present

## 2022-02-18 MED ORDER — SODIUM CHLORIDE 0.9 % IV SOLN: Freq: Once | INTRAVENOUS | Status: AC

## 2022-02-18 MED ORDER — SODIUM CHLORIDE 0.9 % IV SOLN
510.0000 mg | Freq: Once | INTRAVENOUS | Status: AC
  Administered 2022-02-18: 510 mg via INTRAVENOUS
  Filled 2022-02-18: qty 510

## 2022-02-18 NOTE — Patient Instructions (Signed)

## 2022-02-19 DIAGNOSIS — M159 Polyosteoarthritis, unspecified: Secondary | ICD-10-CM | POA: Diagnosis not present

## 2022-02-19 DIAGNOSIS — N400 Enlarged prostate without lower urinary tract symptoms: Secondary | ICD-10-CM | POA: Diagnosis not present

## 2022-02-19 DIAGNOSIS — M25551 Pain in right hip: Secondary | ICD-10-CM | POA: Diagnosis not present

## 2022-02-19 DIAGNOSIS — N184 Chronic kidney disease, stage 4 (severe): Secondary | ICD-10-CM | POA: Diagnosis not present

## 2022-02-19 DIAGNOSIS — Z87891 Personal history of nicotine dependence: Secondary | ICD-10-CM | POA: Diagnosis not present

## 2022-02-19 DIAGNOSIS — G629 Polyneuropathy, unspecified: Secondary | ICD-10-CM | POA: Diagnosis not present

## 2022-02-19 DIAGNOSIS — I82409 Acute embolism and thrombosis of unspecified deep veins of unspecified lower extremity: Secondary | ICD-10-CM | POA: Diagnosis not present

## 2022-02-19 DIAGNOSIS — L111 Transient acantholytic dermatosis [Grover]: Secondary | ICD-10-CM | POA: Diagnosis not present

## 2022-02-19 DIAGNOSIS — J439 Emphysema, unspecified: Secondary | ICD-10-CM | POA: Diagnosis not present

## 2022-03-20 DIAGNOSIS — M159 Polyosteoarthritis, unspecified: Secondary | ICD-10-CM | POA: Diagnosis not present

## 2022-03-20 DIAGNOSIS — I82409 Acute embolism and thrombosis of unspecified deep veins of unspecified lower extremity: Secondary | ICD-10-CM | POA: Diagnosis not present

## 2022-03-20 DIAGNOSIS — G629 Polyneuropathy, unspecified: Secondary | ICD-10-CM | POA: Diagnosis not present

## 2022-03-20 DIAGNOSIS — L111 Transient acantholytic dermatosis [Grover]: Secondary | ICD-10-CM | POA: Diagnosis not present

## 2022-03-20 DIAGNOSIS — J439 Emphysema, unspecified: Secondary | ICD-10-CM | POA: Diagnosis not present

## 2022-03-20 DIAGNOSIS — N184 Chronic kidney disease, stage 4 (severe): Secondary | ICD-10-CM | POA: Diagnosis not present

## 2022-03-20 DIAGNOSIS — Z87891 Personal history of nicotine dependence: Secondary | ICD-10-CM | POA: Diagnosis not present

## 2022-03-20 DIAGNOSIS — N400 Enlarged prostate without lower urinary tract symptoms: Secondary | ICD-10-CM | POA: Diagnosis not present

## 2022-03-20 DIAGNOSIS — I10 Essential (primary) hypertension: Secondary | ICD-10-CM | POA: Diagnosis not present

## 2022-04-03 DIAGNOSIS — L300 Nummular dermatitis: Secondary | ICD-10-CM | POA: Diagnosis not present

## 2022-04-03 DIAGNOSIS — L299 Pruritus, unspecified: Secondary | ICD-10-CM | POA: Diagnosis not present

## 2022-04-03 DIAGNOSIS — D485 Neoplasm of uncertain behavior of skin: Secondary | ICD-10-CM | POA: Diagnosis not present

## 2022-04-09 DIAGNOSIS — C44729 Squamous cell carcinoma of skin of left lower limb, including hip: Secondary | ICD-10-CM | POA: Diagnosis not present

## 2022-04-20 DIAGNOSIS — I10 Essential (primary) hypertension: Secondary | ICD-10-CM | POA: Diagnosis not present

## 2022-04-20 DIAGNOSIS — G629 Polyneuropathy, unspecified: Secondary | ICD-10-CM | POA: Diagnosis not present

## 2022-04-20 DIAGNOSIS — M159 Polyosteoarthritis, unspecified: Secondary | ICD-10-CM | POA: Diagnosis not present

## 2022-04-20 DIAGNOSIS — N184 Chronic kidney disease, stage 4 (severe): Secondary | ICD-10-CM | POA: Diagnosis not present

## 2022-04-20 DIAGNOSIS — Z87891 Personal history of nicotine dependence: Secondary | ICD-10-CM | POA: Diagnosis not present

## 2022-04-20 DIAGNOSIS — N400 Enlarged prostate without lower urinary tract symptoms: Secondary | ICD-10-CM | POA: Diagnosis not present

## 2022-04-20 DIAGNOSIS — J439 Emphysema, unspecified: Secondary | ICD-10-CM | POA: Diagnosis not present

## 2022-04-20 DIAGNOSIS — I82409 Acute embolism and thrombosis of unspecified deep veins of unspecified lower extremity: Secondary | ICD-10-CM | POA: Diagnosis not present

## 2022-04-20 DIAGNOSIS — L111 Transient acantholytic dermatosis [Grover]: Secondary | ICD-10-CM | POA: Diagnosis not present

## 2022-04-30 DIAGNOSIS — I82409 Acute embolism and thrombosis of unspecified deep veins of unspecified lower extremity: Secondary | ICD-10-CM | POA: Diagnosis not present

## 2022-04-30 DIAGNOSIS — D509 Iron deficiency anemia, unspecified: Secondary | ICD-10-CM | POA: Diagnosis not present

## 2022-04-30 DIAGNOSIS — N1831 Chronic kidney disease, stage 3a: Secondary | ICD-10-CM | POA: Diagnosis not present

## 2022-04-30 DIAGNOSIS — Z87891 Personal history of nicotine dependence: Secondary | ICD-10-CM | POA: Diagnosis not present

## 2022-04-30 DIAGNOSIS — N400 Enlarged prostate without lower urinary tract symptoms: Secondary | ICD-10-CM | POA: Diagnosis not present

## 2022-04-30 DIAGNOSIS — L111 Transient acantholytic dermatosis [Grover]: Secondary | ICD-10-CM | POA: Diagnosis not present

## 2022-04-30 DIAGNOSIS — J439 Emphysema, unspecified: Secondary | ICD-10-CM | POA: Diagnosis not present

## 2022-04-30 DIAGNOSIS — N184 Chronic kidney disease, stage 4 (severe): Secondary | ICD-10-CM | POA: Diagnosis not present

## 2022-04-30 DIAGNOSIS — G629 Polyneuropathy, unspecified: Secondary | ICD-10-CM | POA: Diagnosis not present

## 2022-04-30 DIAGNOSIS — M159 Polyosteoarthritis, unspecified: Secondary | ICD-10-CM | POA: Diagnosis not present

## 2022-05-16 DIAGNOSIS — G629 Polyneuropathy, unspecified: Secondary | ICD-10-CM | POA: Diagnosis not present

## 2022-05-16 DIAGNOSIS — N400 Enlarged prostate without lower urinary tract symptoms: Secondary | ICD-10-CM | POA: Diagnosis not present

## 2022-05-16 DIAGNOSIS — M159 Polyosteoarthritis, unspecified: Secondary | ICD-10-CM | POA: Diagnosis not present

## 2022-05-16 DIAGNOSIS — Z87891 Personal history of nicotine dependence: Secondary | ICD-10-CM | POA: Diagnosis not present

## 2022-05-16 DIAGNOSIS — L111 Transient acantholytic dermatosis [Grover]: Secondary | ICD-10-CM | POA: Diagnosis not present

## 2022-05-16 DIAGNOSIS — J439 Emphysema, unspecified: Secondary | ICD-10-CM | POA: Diagnosis not present

## 2022-05-16 DIAGNOSIS — I82409 Acute embolism and thrombosis of unspecified deep veins of unspecified lower extremity: Secondary | ICD-10-CM | POA: Diagnosis not present

## 2022-05-16 DIAGNOSIS — N1831 Chronic kidney disease, stage 3a: Secondary | ICD-10-CM | POA: Diagnosis not present

## 2022-05-16 DIAGNOSIS — N184 Chronic kidney disease, stage 4 (severe): Secondary | ICD-10-CM | POA: Diagnosis not present

## 2022-05-27 NOTE — Progress Notes (Incomplete)
[facility]  [address] [place],  [place]  [address] [phone]  Clinic Day:  01/28/2022  HISTORY OF PRESENT ILLNESS:  The patient is a 76 y.o. male with recurrent/locally advanced squamous cell skin cancer.  He completed his definitive chemoradiation in October 2021, which consisted of 3 cycles of cisplatin.  He comes in today for repeat clinical assessment. Since his last visit, the patient has been doing okay.  He still has occasional pain with the lateral aspects of right face.  Of note, squamous cell skin cancer was just removed from his forehead last week.  He denies having any new symptoms or findings which concern him for more advanced disease being present.  PHYSICAL EXAM:  There were no vitals taken for this visit. Wt Readings from Last 3 Encounters:  02/18/22 151 lb (68.5 kg)  02/11/22 150 lb 1.3 oz (68.1 kg)  01/28/22 153 lb 4.8 oz (69.5 kg)   There is no height or weight on file to calculate BMI. Performance status (ECOG): 1 - Symptomatic but completely ambulatory Physical Exam Constitutional:      General: He is not in acute distress.    Appearance: Normal appearance. He is normal weight.  HENT:     Head: Normocephalic and atraumatic.  Eyes:     General: No scleral icterus.    Extraocular Movements: Extraocular movements intact.     Conjunctiva/sclera: Conjunctivae normal.     Pupils: Pupils are equal, round, and reactive to light.  Cardiovascular:     Rate and Rhythm: Normal rate and regular rhythm.     Pulses: Normal pulses.     Heart sounds: Normal heart sounds. No murmur heard.   No friction rub. No gallop.  Pulmonary:     Effort: Pulmonary effort is normal. No respiratory distress.     Breath sounds: Normal breath sounds.  Abdominal:     General: Bowel sounds are normal. There is no distension.     Palpations: Abdomen is soft. There is no hepatomegaly, splenomegaly or mass.     Tenderness: There is no abdominal  tenderness.  Musculoskeletal:        General: Normal range of motion.     Cervical back: Normal range of motion and neck supple.     Right lower leg: No edema.     Left lower leg: No edema.  Lymphadenopathy:     Cervical: No cervical adenopathy.  Skin:    General: Skin is warm and dry.  Neurological:     General: No focal deficit present.     Mental Status: He is alert and oriented to person, place, and time. Mental status is at baseline.  Psychiatric:        Mood and Affect: Mood normal.        Behavior: Behavior normal.        Thought Content: Thought content normal.        Judgment: Judgment normal.   LABS:      Latest Ref Rng & Units 01/28/2022   12:00 AM 09/27/2021   12:00 AM 06/04/2021   12:00 AM  CBC  WBC  4.8   5.2      6.5    Hemoglobin 13.5 - 17.5 11.8   12.0      11.1    Hematocrit 41 - 53 37   37      34    Platelets 150 - 399 289   391      403  This result is from an external source.       Latest Ref Rng & Units 01/28/2022   12:00 AM 09/27/2021   12:00 AM 06/04/2021   12:00 AM  CMP  BUN 4 - '21 24   19      '$ 32    Creatinine 0.6 - 1.3 1.4   1.1      1.2    Sodium 137 - 147 135   129      135    Potassium 3.4 - 5.3 4.3   3.9      4.6    Chloride 99 - 108 98   91      101    CO2 13 - '22 28   26      21    '$ Calcium 8.7 - 10.7 8.9   9.1      9.2    Alkaline Phos 25 - 125 97   96      90    AST 14 - 40 32   29      30    ALT 10 - 40 '17   14      16       '$ This result is from an external source.     Latest Reference Range & Units 01/28/22 11:22  Iron 45 - 182 ug/dL 62  UIBC ug/dL 453  TIBC 250 - 450 ug/dL 515 (H)  Saturation Ratios 17.9 - 39.5 % 12 (L)  Ferritin 24 - 336 ng/mL 8 (L)  (H): Data is abnormally high (L): Data is abnormally low  ASSESSMENT & PLAN:  Assessment/Plan:  A 76 y.o. male with recurrent/locally advanced squamous cell skin cancer.  Per his physical exam today, there remain no signs of disease recurrence.  Clinically, he appears to  be doing okay.  On another note, this gentleman's hemoglobin today is lower than what it has been previously.  His iron parameters today definitely suggest this gentleman is iron deficient.  Based upon this, I will arrange for him to receive IV iron in the forthcoming weeks to replenish his iron stores and normalize his hemoglobin.  I will also refer him to GI to ensure his iron deficiency anemia is not related to any ominous occult GI tract pathology.  Otherwise, I will see him back in 4 months for repeat clinical assessment.  The patient understands all the plans discussed today and is in agreement with them.   I, Rita Ohara, am acting as scribe for Marice Potter, MD    I have reviewed this report as typed by the medical scribe, and it is complete and accurate.  Riaz Onorato Macarthur Critchley, MD

## 2022-05-28 ENCOUNTER — Ambulatory Visit: Payer: Medicare HMO | Admitting: Oncology

## 2022-05-28 ENCOUNTER — Telehealth: Payer: Self-pay | Admitting: Oncology

## 2022-05-28 ENCOUNTER — Inpatient Hospital Stay: Payer: Medicare HMO

## 2022-05-28 NOTE — Telephone Encounter (Signed)
Patient rescheduled fro 6/6 to 6/8 - He did not realize he had an Appt

## 2022-05-29 NOTE — Progress Notes (Signed)
[facility]  [address] [place],  [place]  [address] [phone]  Clinic Day:  05/30/2022  HISTORY OF PRESENT ILLNESS:  The patient is a 76 y.o. male with recurrent/locally advanced squamous cell skin cancer.  He completed his definitive chemoradiation in October 2021, which consisted of 3 cycles of cisplatin.  He comes in today for repeat clinical assessment. Since his last visit, the patient has been doing fine.  Of note, small squamous cell skin cancers have been removed from his scalp and torso recently.  However, he denies having any new symptoms or findings which concern him for more advanced disease being present.  Of note, this patient was also recently found to have iron deficiency anemia, for which IV iron was given.  He has had recent GI procedures done which showed no obvious GI tract pathology.  He continues to deny having any overt forms of GI blood loss to explain his recent iron deficiency anemia.  PHYSICAL EXAM:  Blood pressure (!) 160/74, pulse (!) 108, temperature 98.4 F (36.9 C), resp. rate 18, height '5\' 9"'$  (1.753 m), weight 155 lb 9.6 oz (70.6 kg), SpO2 95 %. Wt Readings from Last 3 Encounters:  05/30/22 155 lb 9.6 oz (70.6 kg)  02/18/22 151 lb (68.5 kg)  02/11/22 150 lb 1.3 oz (68.1 kg)   Body mass index is 22.98 kg/m. Performance status (ECOG): 1 - Symptomatic but completely ambulatory Physical Exam Constitutional:      General: He is not in acute distress.    Appearance: Normal appearance. He is normal weight.  HENT:     Head: Normocephalic and atraumatic.  Eyes:     General: No scleral icterus.    Extraocular Movements: Extraocular movements intact.     Conjunctiva/sclera: Conjunctivae normal.     Pupils: Pupils are equal, round, and reactive to light.  Cardiovascular:     Rate and Rhythm: Normal rate and regular rhythm.     Pulses: Normal pulses.     Heart sounds: Normal heart sounds. No murmur heard.    No friction  rub. No gallop.  Pulmonary:     Effort: Pulmonary effort is normal. No respiratory distress.     Breath sounds: Normal breath sounds.  Abdominal:     General: Bowel sounds are normal. There is no distension.     Palpations: Abdomen is soft. There is no hepatomegaly, splenomegaly or mass.     Tenderness: There is no abdominal tenderness.  Musculoskeletal:        General: Normal range of motion.     Cervical back: Normal range of motion and neck supple.     Right lower leg: No edema.     Left lower leg: No edema.  Lymphadenopathy:     Cervical: No cervical adenopathy.  Skin:    General: Skin is warm and dry.  Neurological:     General: No focal deficit present.     Mental Status: He is alert and oriented to person, place, and time. Mental status is at baseline.  Psychiatric:        Mood and Affect: Mood normal.        Behavior: Behavior normal.        Thought Content: Thought content normal.        Judgment: Judgment normal.    LABS:       Latest Ref Rng & Units 05/30/2022   12:00 AM 01/28/2022   12:00 AM 09/27/2021   12:00 AM  CBC  WBC  4.1     4.8  5.2      Hemoglobin 13.5 - 17.5 12.8     11.8  12.0      Hematocrit 41 - 53 39     37  37      Platelets 150 - 400 K/uL 231     289  391         This result is from an external source.      Latest Ref Rng & Units 05/30/2022   12:00 AM 01/28/2022   12:00 AM 09/27/2021   12:00 AM  CMP  BUN 4 - '21 13     24  19      '$ Creatinine 0.6 - 1.3 1.3     1.4  1.1      Sodium 137 - 147 134     135  129      Potassium 3.5 - 5.1 mEq/L 3.8     4.3  3.9      Chloride 99 - 108 96     98  91      CO2 13 - '22 28     28  26      '$ Calcium 8.7 - 10.7 8.9     8.9  9.1      Alkaline Phos 25 - 125 79     97  96      AST 14 - 40 30     32  29      ALT 10 - 40 U/L '17     17  14         '$ This result is from an external source.    Latest Reference Range & Units Most Recent 01/28/22 11:22  Iron 45 - 182 ug/dL 89 05/31/22 13:37 62  UIBC ug/dL 300 05/31/22  13:37 453  TIBC 250 - 450 ug/dL 389 05/31/22 13:37 515 (H)  Saturation Ratios 17.9 - 39.5 % 23 05/31/22 13:37 12 (L)  Ferritin 24 - 336 ng/mL 134 05/31/22 13:37 8 (L)  (H): Data is abnormally high (L): Data is abnormally low  ASSESSMENT & PLAN:  Assessment/Plan:  A 76 y.o. male with recurrent/locally advanced squamous cell skin cancer.  Per his physical exam today, there remain no signs of disease recurrence.  Clinically, he appears to be doing okay.  With respect to his newly diagnosed iron deficiency anemia, his labs today show he definitely had an improvement in his hematologic parameters since receiving his IV iron.  Overall, the patient appears to be doing well.  He knows it remains important to follow-up with his dermatologist for his continued skin cancer surveillance.  Otherwise, I will see him back in 4 months for repeat clinical assessment.  The patient understands all the plans discussed today and is in agreement with them.  Alejandra Hunt Macarthur Critchley, MD

## 2022-05-30 ENCOUNTER — Other Ambulatory Visit: Payer: Self-pay

## 2022-05-30 ENCOUNTER — Inpatient Hospital Stay: Payer: Medicare HMO | Attending: Oncology

## 2022-05-30 ENCOUNTER — Inpatient Hospital Stay (INDEPENDENT_AMBULATORY_CARE_PROVIDER_SITE_OTHER): Payer: Medicare HMO | Admitting: Oncology

## 2022-05-30 ENCOUNTER — Other Ambulatory Visit: Payer: Self-pay | Admitting: Oncology

## 2022-05-30 VITALS — BP 160/74 | HR 108 | Temp 98.4°F | Resp 18 | Ht 69.0 in | Wt 155.6 lb

## 2022-05-30 DIAGNOSIS — C44329 Squamous cell carcinoma of skin of other parts of face: Secondary | ICD-10-CM

## 2022-05-30 DIAGNOSIS — D508 Other iron deficiency anemias: Secondary | ICD-10-CM

## 2022-05-30 DIAGNOSIS — Z08 Encounter for follow-up examination after completed treatment for malignant neoplasm: Secondary | ICD-10-CM | POA: Insufficient documentation

## 2022-05-30 DIAGNOSIS — D509 Iron deficiency anemia, unspecified: Secondary | ICD-10-CM

## 2022-05-30 DIAGNOSIS — Z85828 Personal history of other malignant neoplasm of skin: Secondary | ICD-10-CM | POA: Insufficient documentation

## 2022-05-30 LAB — BASIC METABOLIC PANEL
BUN: 13 (ref 4–21)
CO2: 28 — AB (ref 13–22)
Chloride: 96 — AB (ref 99–108)
Creatinine: 1.3 (ref 0.6–1.3)
Glucose: 147
Potassium: 3.8 mEq/L (ref 3.5–5.1)
Sodium: 134 — AB (ref 137–147)

## 2022-05-30 LAB — CBC AND DIFFERENTIAL
HCT: 39 — AB (ref 41–53)
Hemoglobin: 12.8 — AB (ref 13.5–17.5)
Neutrophils Absolute: 2.54
Platelets: 231 10*3/uL (ref 150–400)
WBC: 4.1

## 2022-05-30 LAB — COMPREHENSIVE METABOLIC PANEL
Albumin: 4.7 (ref 3.5–5.0)
Calcium: 8.9 (ref 8.7–10.7)

## 2022-05-30 LAB — CBC: RBC: 4.27 (ref 3.87–5.11)

## 2022-05-30 LAB — HEPATIC FUNCTION PANEL
ALT: 17 U/L (ref 10–40)
AST: 30 (ref 14–40)
Alkaline Phosphatase: 79 (ref 25–125)
Bilirubin, Total: 1

## 2022-05-30 LAB — TSH: TSH: 1.546 u[IU]/mL (ref 0.350–4.500)

## 2022-05-31 ENCOUNTER — Telehealth: Payer: Self-pay | Admitting: Oncology

## 2022-05-31 DIAGNOSIS — Z08 Encounter for follow-up examination after completed treatment for malignant neoplasm: Secondary | ICD-10-CM | POA: Diagnosis not present

## 2022-05-31 DIAGNOSIS — D509 Iron deficiency anemia, unspecified: Secondary | ICD-10-CM | POA: Diagnosis not present

## 2022-05-31 DIAGNOSIS — Z85828 Personal history of other malignant neoplasm of skin: Secondary | ICD-10-CM | POA: Diagnosis not present

## 2022-05-31 LAB — IRON AND TIBC
Iron: 89 ug/dL (ref 45–182)
Saturation Ratios: 23 % (ref 17.9–39.5)
TIBC: 389 ug/dL (ref 250–450)
UIBC: 300 ug/dL

## 2022-05-31 LAB — FERRITIN: Ferritin: 134 ng/mL (ref 24–336)

## 2022-05-31 NOTE — Telephone Encounter (Signed)
Per 05/30/22 los next appt scheduled and confirmed with patient 

## 2022-06-02 ENCOUNTER — Encounter: Payer: Self-pay | Admitting: Oncology

## 2022-06-05 DIAGNOSIS — L300 Nummular dermatitis: Secondary | ICD-10-CM | POA: Diagnosis not present

## 2022-06-05 DIAGNOSIS — D485 Neoplasm of uncertain behavior of skin: Secondary | ICD-10-CM | POA: Diagnosis not present

## 2022-06-05 DIAGNOSIS — L299 Pruritus, unspecified: Secondary | ICD-10-CM | POA: Diagnosis not present

## 2022-06-05 DIAGNOSIS — R233 Spontaneous ecchymoses: Secondary | ICD-10-CM | POA: Diagnosis not present

## 2022-06-26 DIAGNOSIS — M159 Polyosteoarthritis, unspecified: Secondary | ICD-10-CM | POA: Diagnosis not present

## 2022-06-26 DIAGNOSIS — J439 Emphysema, unspecified: Secondary | ICD-10-CM | POA: Diagnosis not present

## 2022-06-26 DIAGNOSIS — Z87891 Personal history of nicotine dependence: Secondary | ICD-10-CM | POA: Diagnosis not present

## 2022-06-26 DIAGNOSIS — N1831 Chronic kidney disease, stage 3a: Secondary | ICD-10-CM | POA: Diagnosis not present

## 2022-06-26 DIAGNOSIS — G629 Polyneuropathy, unspecified: Secondary | ICD-10-CM | POA: Diagnosis not present

## 2022-06-26 DIAGNOSIS — I82409 Acute embolism and thrombosis of unspecified deep veins of unspecified lower extremity: Secondary | ICD-10-CM | POA: Diagnosis not present

## 2022-06-26 DIAGNOSIS — N400 Enlarged prostate without lower urinary tract symptoms: Secondary | ICD-10-CM | POA: Diagnosis not present

## 2022-06-26 DIAGNOSIS — L111 Transient acantholytic dermatosis [Grover]: Secondary | ICD-10-CM | POA: Diagnosis not present

## 2022-06-26 DIAGNOSIS — N184 Chronic kidney disease, stage 4 (severe): Secondary | ICD-10-CM | POA: Diagnosis not present

## 2022-07-03 DIAGNOSIS — L300 Nummular dermatitis: Secondary | ICD-10-CM | POA: Diagnosis not present

## 2022-07-03 DIAGNOSIS — D485 Neoplasm of uncertain behavior of skin: Secondary | ICD-10-CM | POA: Diagnosis not present

## 2022-07-03 DIAGNOSIS — L299 Pruritus, unspecified: Secondary | ICD-10-CM | POA: Diagnosis not present

## 2022-07-03 DIAGNOSIS — L570 Actinic keratosis: Secondary | ICD-10-CM | POA: Diagnosis not present

## 2022-07-24 DIAGNOSIS — Z87891 Personal history of nicotine dependence: Secondary | ICD-10-CM | POA: Diagnosis not present

## 2022-07-24 DIAGNOSIS — J439 Emphysema, unspecified: Secondary | ICD-10-CM | POA: Diagnosis not present

## 2022-07-24 DIAGNOSIS — G629 Polyneuropathy, unspecified: Secondary | ICD-10-CM | POA: Diagnosis not present

## 2022-07-24 DIAGNOSIS — L111 Transient acantholytic dermatosis [Grover]: Secondary | ICD-10-CM | POA: Diagnosis not present

## 2022-07-24 DIAGNOSIS — N184 Chronic kidney disease, stage 4 (severe): Secondary | ICD-10-CM | POA: Diagnosis not present

## 2022-07-24 DIAGNOSIS — N400 Enlarged prostate without lower urinary tract symptoms: Secondary | ICD-10-CM | POA: Diagnosis not present

## 2022-07-24 DIAGNOSIS — N1831 Chronic kidney disease, stage 3a: Secondary | ICD-10-CM | POA: Diagnosis not present

## 2022-07-24 DIAGNOSIS — M159 Polyosteoarthritis, unspecified: Secondary | ICD-10-CM | POA: Diagnosis not present

## 2022-07-24 DIAGNOSIS — I82409 Acute embolism and thrombosis of unspecified deep veins of unspecified lower extremity: Secondary | ICD-10-CM | POA: Diagnosis not present

## 2022-08-01 DIAGNOSIS — C44529 Squamous cell carcinoma of skin of other part of trunk: Secondary | ICD-10-CM | POA: Diagnosis not present

## 2022-08-21 DIAGNOSIS — M159 Polyosteoarthritis, unspecified: Secondary | ICD-10-CM | POA: Diagnosis not present

## 2022-08-21 DIAGNOSIS — R5382 Chronic fatigue, unspecified: Secondary | ICD-10-CM | POA: Diagnosis not present

## 2022-08-21 DIAGNOSIS — I700 Atherosclerosis of aorta: Secondary | ICD-10-CM | POA: Diagnosis not present

## 2022-08-21 DIAGNOSIS — J439 Emphysema, unspecified: Secondary | ICD-10-CM | POA: Diagnosis not present

## 2022-08-21 DIAGNOSIS — D509 Iron deficiency anemia, unspecified: Secondary | ICD-10-CM | POA: Diagnosis not present

## 2022-08-21 DIAGNOSIS — N183 Chronic kidney disease, stage 3 unspecified: Secondary | ICD-10-CM | POA: Diagnosis not present

## 2022-08-21 DIAGNOSIS — I82409 Acute embolism and thrombosis of unspecified deep veins of unspecified lower extremity: Secondary | ICD-10-CM | POA: Diagnosis not present

## 2022-08-21 DIAGNOSIS — I10 Essential (primary) hypertension: Secondary | ICD-10-CM | POA: Diagnosis not present

## 2022-08-21 DIAGNOSIS — N400 Enlarged prostate without lower urinary tract symptoms: Secondary | ICD-10-CM | POA: Diagnosis not present

## 2022-08-21 DIAGNOSIS — G629 Polyneuropathy, unspecified: Secondary | ICD-10-CM | POA: Diagnosis not present

## 2022-08-21 IMAGING — US US RENAL
1 series · 14 of 25 positions shown · non-contrast
Comparison: 07/21/2020

CLINICAL DATA: Acute kidney injury.

EXAM:
RENAL / URINARY TRACT ULTRASOUND COMPLETE

[Series 1: us renal · 0.20mm/px · 14 of 44 slices shown]
[im 1/44]
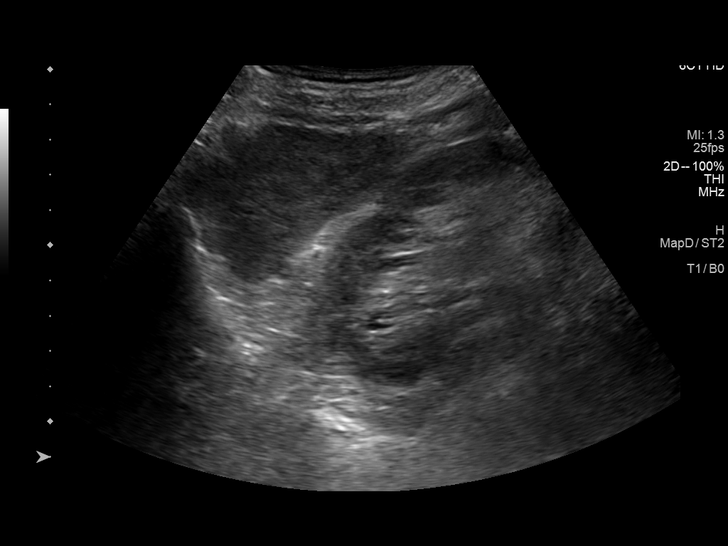
[im 4/44]
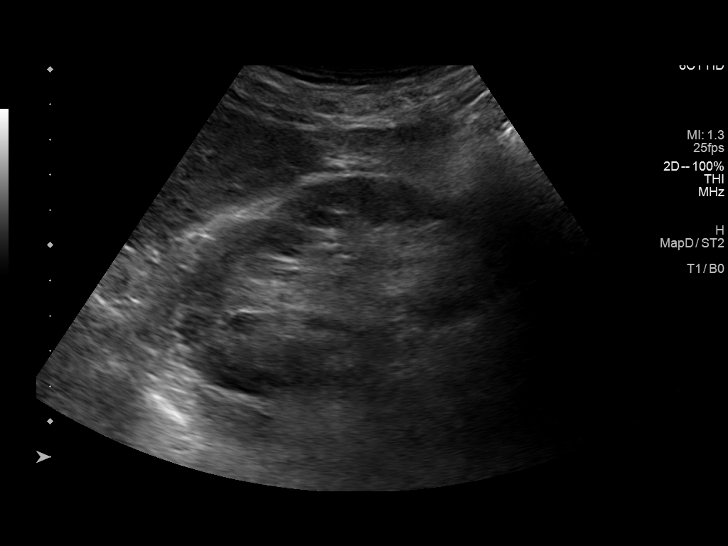
[im 8/44]
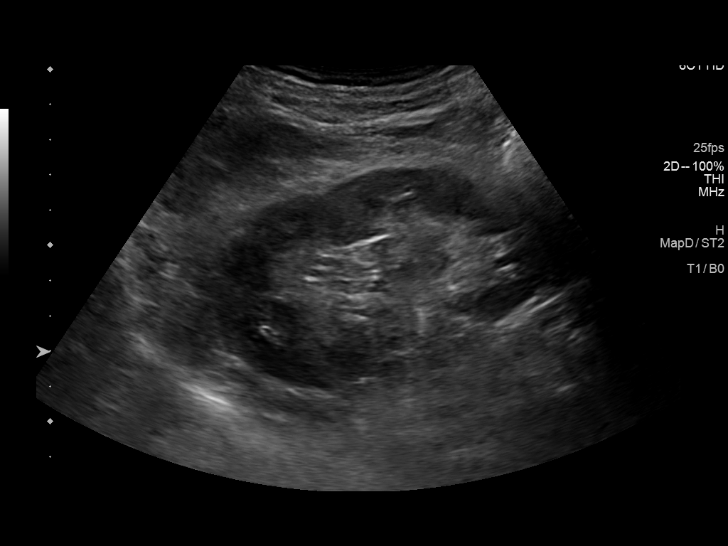
[im 11/44]
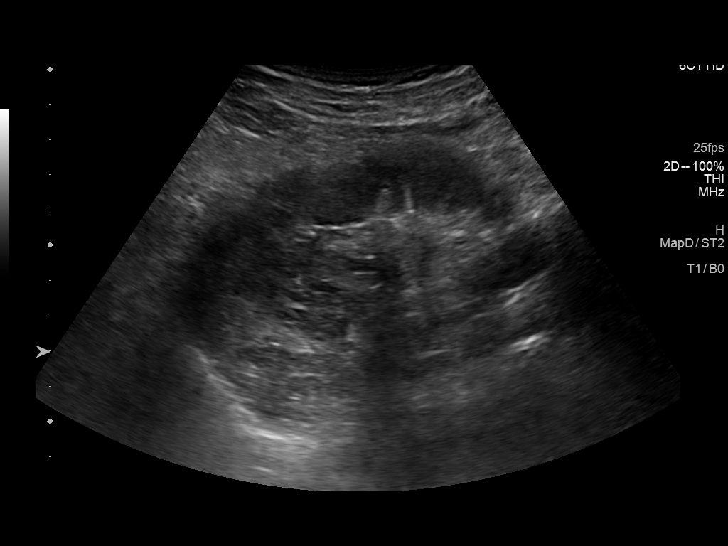
[im 15/44]
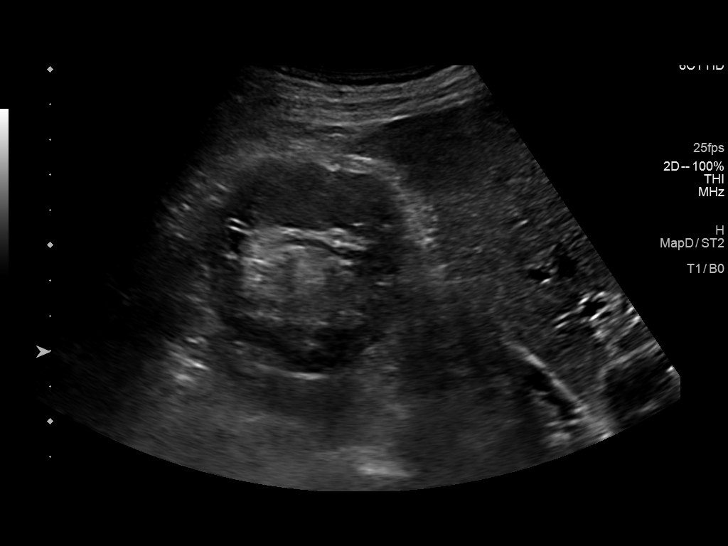
[im 17/44]
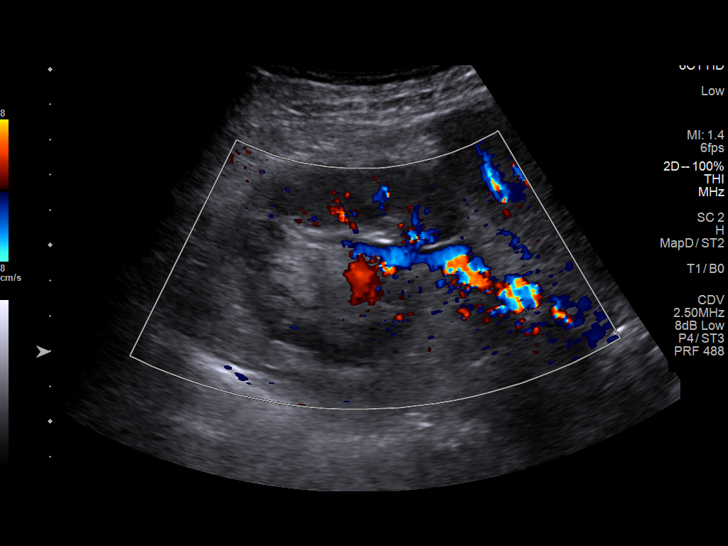
[im 20/44]
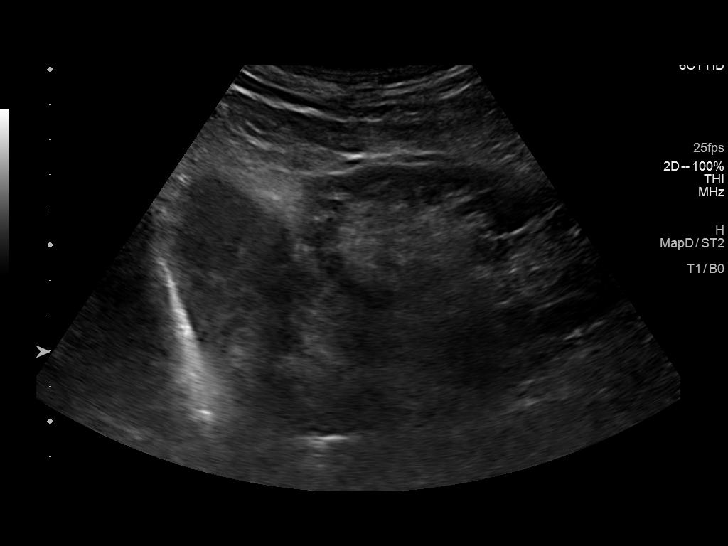
[im 24/44]
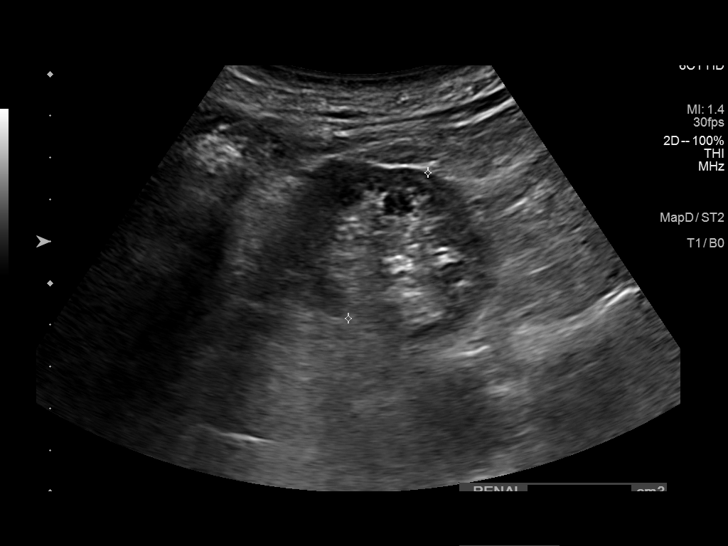
[im 27/44]
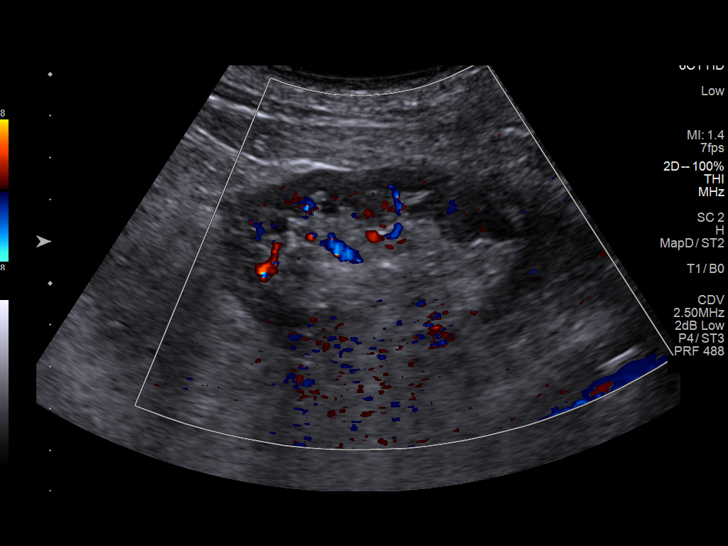
[im 29/44]
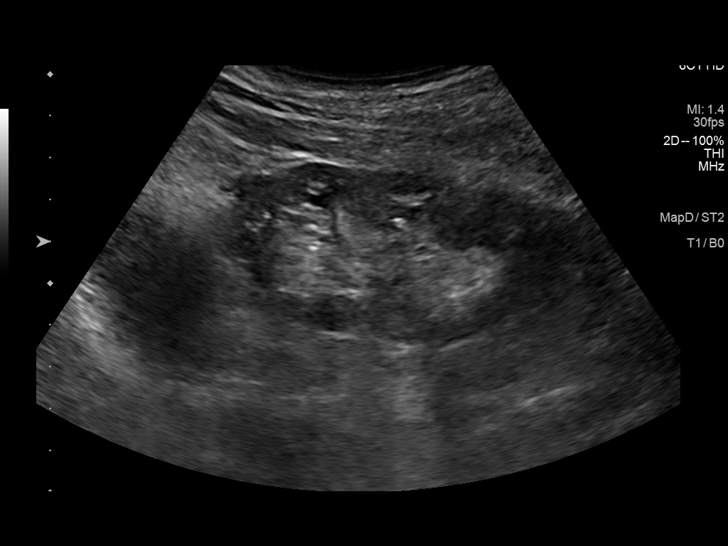
[im 33/44]
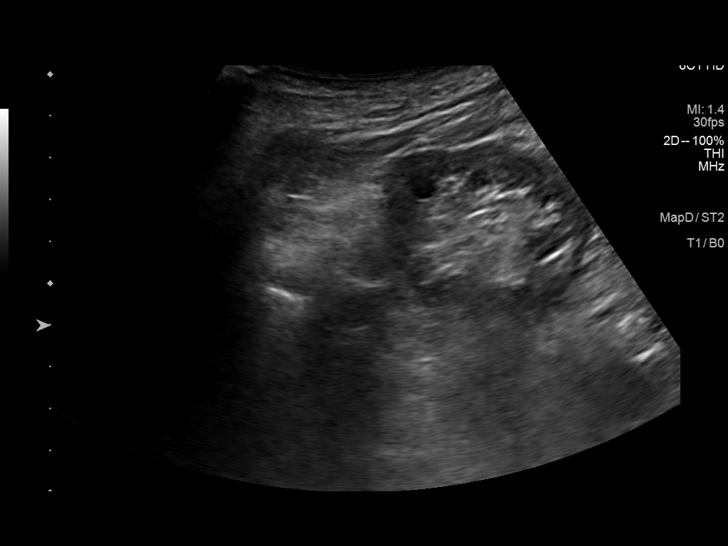
[im 36/44]
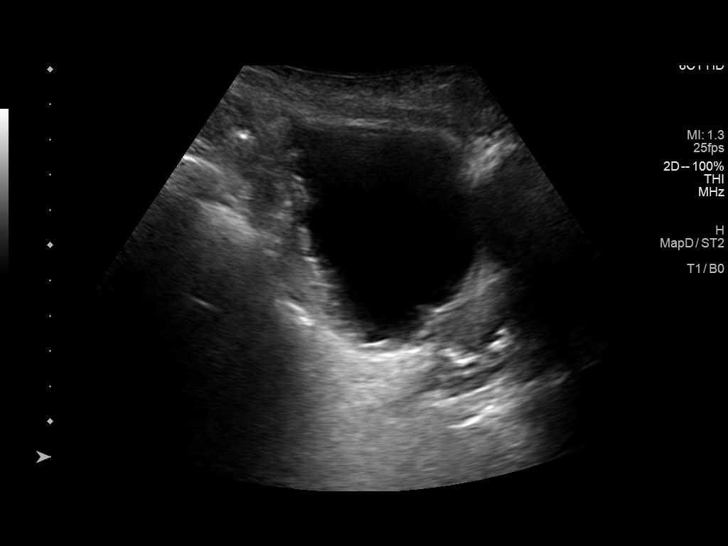
[im 40/44]
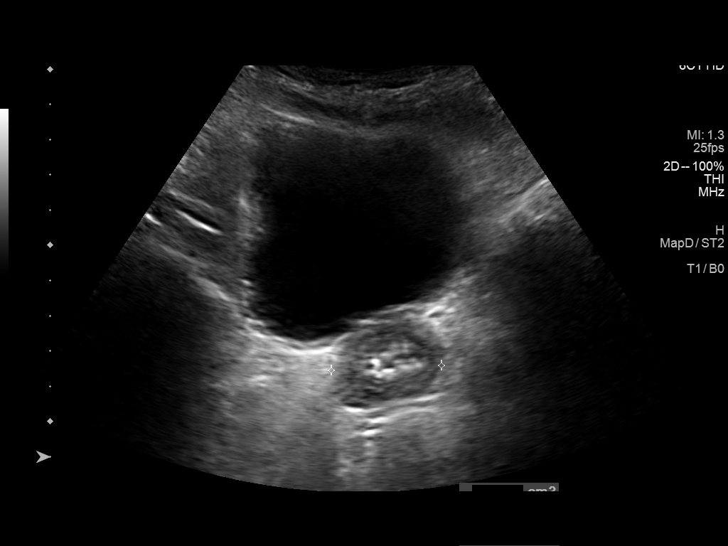
[im 44/44]
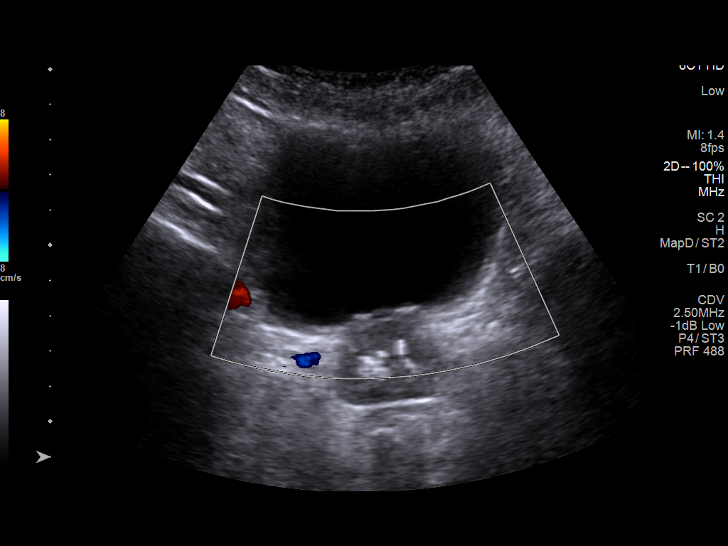

[14 of 25 positions shown; findings below may reference images not displayed]

FINDINGS: Right Kidney:

Renal measurements: 10.6 x 5.4 x 5.5 cm = volume: 165 mL.
Echogenicity within normal limits. No mass or hydronephrosis
visualized.

Left Kidney:

Renal measurements: 10.0 x 4.8 x 4.0 cm = volume: 99 mL.
Echogenicity within normal limits. No mass or hydronephrosis
visualized. Mild cortical thinning.

Bladder:

Fluid in the urinary bladder. Difficult to exclude bladder wall
thickening. Prostate measures 3.5 x 3.6 x 3.1 cm, calculated volume
is 21 mL.

Other:

None.
IMPRESSION: 1. Negative for hydronephrosis.
2. Mild cortical thinning in the left kidney. Otherwise, normal
appearance of both kidneys.

## 2022-09-05 DIAGNOSIS — G629 Polyneuropathy, unspecified: Secondary | ICD-10-CM | POA: Diagnosis not present

## 2022-09-05 DIAGNOSIS — M159 Polyosteoarthritis, unspecified: Secondary | ICD-10-CM | POA: Diagnosis not present

## 2022-09-05 DIAGNOSIS — I10 Essential (primary) hypertension: Secondary | ICD-10-CM | POA: Diagnosis not present

## 2022-09-05 DIAGNOSIS — I700 Atherosclerosis of aorta: Secondary | ICD-10-CM | POA: Diagnosis not present

## 2022-09-05 DIAGNOSIS — J439 Emphysema, unspecified: Secondary | ICD-10-CM | POA: Diagnosis not present

## 2022-09-05 DIAGNOSIS — N400 Enlarged prostate without lower urinary tract symptoms: Secondary | ICD-10-CM | POA: Diagnosis not present

## 2022-09-05 DIAGNOSIS — I82409 Acute embolism and thrombosis of unspecified deep veins of unspecified lower extremity: Secondary | ICD-10-CM | POA: Diagnosis not present

## 2022-09-05 DIAGNOSIS — D509 Iron deficiency anemia, unspecified: Secondary | ICD-10-CM | POA: Diagnosis not present

## 2022-09-05 DIAGNOSIS — N183 Chronic kidney disease, stage 3 unspecified: Secondary | ICD-10-CM | POA: Diagnosis not present

## 2022-09-20 DIAGNOSIS — G629 Polyneuropathy, unspecified: Secondary | ICD-10-CM | POA: Diagnosis not present

## 2022-09-20 DIAGNOSIS — M159 Polyosteoarthritis, unspecified: Secondary | ICD-10-CM | POA: Diagnosis not present

## 2022-09-20 DIAGNOSIS — N183 Chronic kidney disease, stage 3 unspecified: Secondary | ICD-10-CM | POA: Diagnosis not present

## 2022-09-20 DIAGNOSIS — J439 Emphysema, unspecified: Secondary | ICD-10-CM | POA: Diagnosis not present

## 2022-09-20 DIAGNOSIS — N400 Enlarged prostate without lower urinary tract symptoms: Secondary | ICD-10-CM | POA: Diagnosis not present

## 2022-09-20 DIAGNOSIS — M25551 Pain in right hip: Secondary | ICD-10-CM | POA: Diagnosis not present

## 2022-09-20 DIAGNOSIS — I10 Essential (primary) hypertension: Secondary | ICD-10-CM | POA: Diagnosis not present

## 2022-09-20 DIAGNOSIS — I700 Atherosclerosis of aorta: Secondary | ICD-10-CM | POA: Diagnosis not present

## 2022-09-20 DIAGNOSIS — D509 Iron deficiency anemia, unspecified: Secondary | ICD-10-CM | POA: Diagnosis not present

## 2022-09-26 DIAGNOSIS — G629 Polyneuropathy, unspecified: Secondary | ICD-10-CM | POA: Diagnosis not present

## 2022-09-26 DIAGNOSIS — M25552 Pain in left hip: Secondary | ICD-10-CM | POA: Diagnosis not present

## 2022-09-26 DIAGNOSIS — N400 Enlarged prostate without lower urinary tract symptoms: Secondary | ICD-10-CM | POA: Diagnosis not present

## 2022-09-26 DIAGNOSIS — J439 Emphysema, unspecified: Secondary | ICD-10-CM | POA: Diagnosis not present

## 2022-09-26 DIAGNOSIS — D509 Iron deficiency anemia, unspecified: Secondary | ICD-10-CM | POA: Diagnosis not present

## 2022-09-26 DIAGNOSIS — I10 Essential (primary) hypertension: Secondary | ICD-10-CM | POA: Diagnosis not present

## 2022-09-26 DIAGNOSIS — M159 Polyosteoarthritis, unspecified: Secondary | ICD-10-CM | POA: Diagnosis not present

## 2022-09-26 DIAGNOSIS — N183 Chronic kidney disease, stage 3 unspecified: Secondary | ICD-10-CM | POA: Diagnosis not present

## 2022-09-26 DIAGNOSIS — I700 Atherosclerosis of aorta: Secondary | ICD-10-CM | POA: Diagnosis not present

## 2022-09-30 NOTE — Progress Notes (Unsigned)
Phillip Logan  [address] [place],  [place]  [address] [phone]  Clinic Day:  10/01/2022  HISTORY OF PRESENT ILLNESS:  The patient is a 76 y.o. male with recurrent/locally advanced squamous cell skin cancer.  He completed his definitive chemoradiation in October 2021, which consisted of 3 cycles of cisplatin.  He comes in today for repeat clinical assessment. Since his last visit, the patient has been doing fine.  Of note, small squamous cell skin cancers have been removed from his scalp and torso recently.  However, he denies having any new symptoms or findings which concern him for more advanced disease being present.  Of note, this patient was also has a history of  iron deficiency anemia, for which IV iron has been previously given.  He continues to deny having any overt forms of blood loss since his last visit  PHYSICAL EXAM:  Blood pressure (!) 206/96, pulse 94, temperature 98.5 F (36.9 C), resp. rate 18, height '5\' 9"'$  (1.753 m), weight 167 lb 8 oz (76 kg), SpO2 93 %. Wt Readings from Last 3 Encounters:  10/01/22 167 lb 8 oz (76 kg)  05/30/22 155 lb 9.6 oz (70.6 kg)  02/18/22 151 lb (68.5 kg)   Body mass index is 24.74 kg/m. Performance status (ECOG): 1 - Symptomatic but completely ambulatory Physical Exam Constitutional:      General: He is not in acute distress.    Appearance: Normal appearance. He is normal weight.  HENT:     Head: Normocephalic and atraumatic.     Comments: Reconstructed right side of his face looks unchanged in appearance Eyes:     General: No scleral icterus.    Extraocular Movements: Extraocular movements intact.     Conjunctiva/sclera: Conjunctivae normal.     Pupils: Pupils are equal, round, and reactive to light.  Cardiovascular:     Rate and Rhythm: Normal rate and regular rhythm.     Pulses: Normal pulses.     Heart sounds: Normal heart sounds. No murmur heard.    No friction rub. No gallop.  Pulmonary:      Effort: Pulmonary effort is normal. No respiratory distress.     Breath sounds: Normal breath sounds.  Abdominal:     General: Bowel sounds are normal. There is no distension.     Palpations: Abdomen is soft. There is no hepatomegaly, splenomegaly or mass.     Tenderness: There is no abdominal tenderness.  Musculoskeletal:        General: Normal range of motion.     Cervical back: Normal range of motion and neck supple.     Right lower leg: No edema.     Left lower leg: No edema.  Lymphadenopathy:     Cervical: No cervical adenopathy.  Skin:    General: Skin is warm and dry.  Neurological:     General: No focal deficit present.     Mental Status: He is alert and oriented to person, place, and time. Mental status is at baseline.  Psychiatric:        Mood and Affect: Mood normal.        Behavior: Behavior normal.        Thought Content: Thought content normal.        Judgment: Judgment normal.    LABS:      Latest Reference Range & Units 10/01/22 10:20  Iron 45 - 182 ug/dL 93  UIBC ug/dL 359  TIBC 250 - 450 ug/dL 452 (  H)  Saturation Ratios 17.9 - 39.5 % 21  Ferritin 24 - 336 ng/mL 55  (H): Data is abnormally high   ASSESSMENT & PLAN:  Assessment/Plan:  A 77 y.o. male with recurrent/locally advanced squamous cell skin cancer.  Per his physical exam today, there remain no signs of disease recurrence.  He knows it remains important to follow-up with his dermatologist for his continued skin cancer surveillance.  As it pertains to his iron deficiency anemia, his hemoglobin and iron levels are holding stable.  Clinically, he appears to be doing okay.  I will see him back in 6 months for repeat clinical assessment.  The patient understands all the plans discussed today and is in agreement with them.  Wister Hoefle Macarthur Critchley, MD

## 2022-10-01 ENCOUNTER — Telehealth: Payer: Self-pay | Admitting: Oncology

## 2022-10-01 ENCOUNTER — Inpatient Hospital Stay: Payer: Medicare HMO

## 2022-10-01 ENCOUNTER — Other Ambulatory Visit: Payer: Self-pay | Admitting: Oncology

## 2022-10-01 ENCOUNTER — Inpatient Hospital Stay: Payer: Medicare HMO | Attending: Oncology | Admitting: Oncology

## 2022-10-01 VITALS — BP 206/96 | HR 94 | Temp 98.5°F | Resp 18 | Ht 69.0 in | Wt 167.5 lb

## 2022-10-01 DIAGNOSIS — D509 Iron deficiency anemia, unspecified: Secondary | ICD-10-CM | POA: Diagnosis not present

## 2022-10-01 DIAGNOSIS — Z79899 Other long term (current) drug therapy: Secondary | ICD-10-CM | POA: Diagnosis not present

## 2022-10-01 DIAGNOSIS — C44329 Squamous cell carcinoma of skin of other parts of face: Secondary | ICD-10-CM | POA: Diagnosis not present

## 2022-10-01 DIAGNOSIS — D649 Anemia, unspecified: Secondary | ICD-10-CM | POA: Diagnosis not present

## 2022-10-01 DIAGNOSIS — C4442 Squamous cell carcinoma of skin of scalp and neck: Secondary | ICD-10-CM | POA: Insufficient documentation

## 2022-10-01 LAB — HEPATIC FUNCTION PANEL
ALT: 14 U/L (ref 10–40)
AST: 23 (ref 14–40)
Alkaline Phosphatase: 58 (ref 25–125)
Bilirubin, Total: 0.6

## 2022-10-01 LAB — BASIC METABOLIC PANEL
BUN: 40 — AB (ref 4–21)
CO2: 27 — AB (ref 13–22)
Chloride: 103 (ref 99–108)
Creatinine: 1.3 (ref 0.6–1.3)
Glucose: 93
Potassium: 4.4 mEq/L (ref 3.5–5.1)
Sodium: 135 — AB (ref 137–147)

## 2022-10-01 LAB — TSH: TSH: 1.665 u[IU]/mL (ref 0.350–4.500)

## 2022-10-01 LAB — FERRITIN: Ferritin: 55 ng/mL (ref 24–336)

## 2022-10-01 LAB — IRON AND TIBC
Iron: 93 ug/dL (ref 45–182)
Saturation Ratios: 21 % (ref 17.9–39.5)
TIBC: 452 ug/dL — ABNORMAL HIGH (ref 250–450)
UIBC: 359 ug/dL

## 2022-10-01 LAB — COMPREHENSIVE METABOLIC PANEL WITH GFR
Albumin: 4.5 (ref 3.5–5.0)
Calcium: 8.9 (ref 8.7–10.7)

## 2022-10-01 LAB — CBC AND DIFFERENTIAL
HCT: 38 — AB (ref 41–53)
Hemoglobin: 12.7 — AB (ref 13.5–17.5)
Neutrophils Absolute: 4.96
Platelets: 308 10*3/uL (ref 150–400)
WBC: 6.2

## 2022-10-01 LAB — CBC: RBC: 4.35 (ref 3.87–5.11)

## 2022-10-01 NOTE — Telephone Encounter (Signed)
Patient has been scheduled for follow-up visit per 10/01/22 los. Pt given an appt calendar with date and time.

## 2022-10-17 DIAGNOSIS — N400 Enlarged prostate without lower urinary tract symptoms: Secondary | ICD-10-CM | POA: Diagnosis not present

## 2022-10-17 DIAGNOSIS — L309 Dermatitis, unspecified: Secondary | ICD-10-CM | POA: Diagnosis not present

## 2022-10-17 DIAGNOSIS — D509 Iron deficiency anemia, unspecified: Secondary | ICD-10-CM | POA: Diagnosis not present

## 2022-10-17 DIAGNOSIS — G629 Polyneuropathy, unspecified: Secondary | ICD-10-CM | POA: Diagnosis not present

## 2022-10-17 DIAGNOSIS — J439 Emphysema, unspecified: Secondary | ICD-10-CM | POA: Diagnosis not present

## 2022-10-17 DIAGNOSIS — I10 Essential (primary) hypertension: Secondary | ICD-10-CM | POA: Diagnosis not present

## 2022-10-17 DIAGNOSIS — I700 Atherosclerosis of aorta: Secondary | ICD-10-CM | POA: Diagnosis not present

## 2022-10-17 DIAGNOSIS — M159 Polyosteoarthritis, unspecified: Secondary | ICD-10-CM | POA: Diagnosis not present

## 2022-10-17 DIAGNOSIS — N183 Chronic kidney disease, stage 3 unspecified: Secondary | ICD-10-CM | POA: Diagnosis not present

## 2022-11-05 DIAGNOSIS — G629 Polyneuropathy, unspecified: Secondary | ICD-10-CM | POA: Diagnosis not present

## 2022-11-05 DIAGNOSIS — M159 Polyosteoarthritis, unspecified: Secondary | ICD-10-CM | POA: Diagnosis not present

## 2022-11-05 DIAGNOSIS — L309 Dermatitis, unspecified: Secondary | ICD-10-CM | POA: Diagnosis not present

## 2022-11-05 DIAGNOSIS — J439 Emphysema, unspecified: Secondary | ICD-10-CM | POA: Diagnosis not present

## 2022-11-05 DIAGNOSIS — N400 Enlarged prostate without lower urinary tract symptoms: Secondary | ICD-10-CM | POA: Diagnosis not present

## 2022-11-05 DIAGNOSIS — D509 Iron deficiency anemia, unspecified: Secondary | ICD-10-CM | POA: Diagnosis not present

## 2022-11-05 DIAGNOSIS — I10 Essential (primary) hypertension: Secondary | ICD-10-CM | POA: Diagnosis not present

## 2022-11-05 DIAGNOSIS — N183 Chronic kidney disease, stage 3 unspecified: Secondary | ICD-10-CM | POA: Diagnosis not present

## 2022-11-05 DIAGNOSIS — M25551 Pain in right hip: Secondary | ICD-10-CM | POA: Diagnosis not present

## 2022-11-19 DIAGNOSIS — I10 Essential (primary) hypertension: Secondary | ICD-10-CM | POA: Diagnosis not present

## 2022-11-19 DIAGNOSIS — M159 Polyosteoarthritis, unspecified: Secondary | ICD-10-CM | POA: Diagnosis not present

## 2022-11-19 DIAGNOSIS — G629 Polyneuropathy, unspecified: Secondary | ICD-10-CM | POA: Diagnosis not present

## 2022-11-19 DIAGNOSIS — J439 Emphysema, unspecified: Secondary | ICD-10-CM | POA: Diagnosis not present

## 2022-11-19 DIAGNOSIS — L309 Dermatitis, unspecified: Secondary | ICD-10-CM | POA: Diagnosis not present

## 2022-11-19 DIAGNOSIS — D509 Iron deficiency anemia, unspecified: Secondary | ICD-10-CM | POA: Diagnosis not present

## 2022-11-19 DIAGNOSIS — N183 Chronic kidney disease, stage 3 unspecified: Secondary | ICD-10-CM | POA: Diagnosis not present

## 2022-11-19 DIAGNOSIS — N400 Enlarged prostate without lower urinary tract symptoms: Secondary | ICD-10-CM | POA: Diagnosis not present

## 2022-11-19 DIAGNOSIS — M25552 Pain in left hip: Secondary | ICD-10-CM | POA: Diagnosis not present

## 2022-11-27 DIAGNOSIS — D509 Iron deficiency anemia, unspecified: Secondary | ICD-10-CM | POA: Diagnosis not present

## 2022-11-27 DIAGNOSIS — N183 Chronic kidney disease, stage 3 unspecified: Secondary | ICD-10-CM | POA: Diagnosis not present

## 2022-11-27 DIAGNOSIS — M159 Polyosteoarthritis, unspecified: Secondary | ICD-10-CM | POA: Diagnosis not present

## 2022-11-27 DIAGNOSIS — L309 Dermatitis, unspecified: Secondary | ICD-10-CM | POA: Diagnosis not present

## 2022-11-27 DIAGNOSIS — N400 Enlarged prostate without lower urinary tract symptoms: Secondary | ICD-10-CM | POA: Diagnosis not present

## 2022-11-27 DIAGNOSIS — G629 Polyneuropathy, unspecified: Secondary | ICD-10-CM | POA: Diagnosis not present

## 2022-11-27 DIAGNOSIS — D485 Neoplasm of uncertain behavior of skin: Secondary | ICD-10-CM | POA: Diagnosis not present

## 2022-11-27 DIAGNOSIS — L570 Actinic keratosis: Secondary | ICD-10-CM | POA: Diagnosis not present

## 2022-11-27 DIAGNOSIS — I10 Essential (primary) hypertension: Secondary | ICD-10-CM | POA: Diagnosis not present

## 2022-11-27 DIAGNOSIS — J439 Emphysema, unspecified: Secondary | ICD-10-CM | POA: Diagnosis not present

## 2022-11-27 DIAGNOSIS — M25551 Pain in right hip: Secondary | ICD-10-CM | POA: Diagnosis not present

## 2022-12-19 DIAGNOSIS — M159 Polyosteoarthritis, unspecified: Secondary | ICD-10-CM | POA: Diagnosis not present

## 2022-12-19 DIAGNOSIS — D509 Iron deficiency anemia, unspecified: Secondary | ICD-10-CM | POA: Diagnosis not present

## 2022-12-19 DIAGNOSIS — G629 Polyneuropathy, unspecified: Secondary | ICD-10-CM | POA: Diagnosis not present

## 2022-12-19 DIAGNOSIS — L309 Dermatitis, unspecified: Secondary | ICD-10-CM | POA: Diagnosis not present

## 2022-12-19 DIAGNOSIS — N400 Enlarged prostate without lower urinary tract symptoms: Secondary | ICD-10-CM | POA: Diagnosis not present

## 2022-12-19 DIAGNOSIS — N183 Chronic kidney disease, stage 3 unspecified: Secondary | ICD-10-CM | POA: Diagnosis not present

## 2022-12-19 DIAGNOSIS — J439 Emphysema, unspecified: Secondary | ICD-10-CM | POA: Diagnosis not present

## 2022-12-19 DIAGNOSIS — I700 Atherosclerosis of aorta: Secondary | ICD-10-CM | POA: Diagnosis not present

## 2022-12-19 DIAGNOSIS — I10 Essential (primary) hypertension: Secondary | ICD-10-CM | POA: Diagnosis not present

## 2023-02-03 DIAGNOSIS — L578 Other skin changes due to chronic exposure to nonionizing radiation: Secondary | ICD-10-CM | POA: Diagnosis not present

## 2023-02-03 DIAGNOSIS — L300 Nummular dermatitis: Secondary | ICD-10-CM | POA: Diagnosis not present

## 2023-02-03 DIAGNOSIS — L821 Other seborrheic keratosis: Secondary | ICD-10-CM | POA: Diagnosis not present

## 2023-02-06 DIAGNOSIS — C44722 Squamous cell carcinoma of skin of right lower limb, including hip: Secondary | ICD-10-CM | POA: Diagnosis not present

## 2023-02-06 DIAGNOSIS — C4442 Squamous cell carcinoma of skin of scalp and neck: Secondary | ICD-10-CM | POA: Diagnosis not present

## 2023-02-19 DIAGNOSIS — N183 Chronic kidney disease, stage 3 unspecified: Secondary | ICD-10-CM | POA: Diagnosis not present

## 2023-02-19 DIAGNOSIS — D509 Iron deficiency anemia, unspecified: Secondary | ICD-10-CM | POA: Diagnosis not present

## 2023-02-19 DIAGNOSIS — J439 Emphysema, unspecified: Secondary | ICD-10-CM | POA: Diagnosis not present

## 2023-02-19 DIAGNOSIS — N400 Enlarged prostate without lower urinary tract symptoms: Secondary | ICD-10-CM | POA: Diagnosis not present

## 2023-02-19 DIAGNOSIS — M159 Polyosteoarthritis, unspecified: Secondary | ICD-10-CM | POA: Diagnosis not present

## 2023-02-19 DIAGNOSIS — I10 Essential (primary) hypertension: Secondary | ICD-10-CM | POA: Diagnosis not present

## 2023-02-19 DIAGNOSIS — L309 Dermatitis, unspecified: Secondary | ICD-10-CM | POA: Diagnosis not present

## 2023-02-19 DIAGNOSIS — R609 Edema, unspecified: Secondary | ICD-10-CM | POA: Diagnosis not present

## 2023-02-19 DIAGNOSIS — G629 Polyneuropathy, unspecified: Secondary | ICD-10-CM | POA: Diagnosis not present

## 2023-03-04 DIAGNOSIS — L728 Other follicular cysts of the skin and subcutaneous tissue: Secondary | ICD-10-CM | POA: Diagnosis not present

## 2023-03-04 DIAGNOSIS — C44722 Squamous cell carcinoma of skin of right lower limb, including hip: Secondary | ICD-10-CM | POA: Diagnosis not present

## 2023-03-04 DIAGNOSIS — C4442 Squamous cell carcinoma of skin of scalp and neck: Secondary | ICD-10-CM | POA: Diagnosis not present

## 2023-03-04 DIAGNOSIS — L820 Inflamed seborrheic keratosis: Secondary | ICD-10-CM | POA: Diagnosis not present

## 2023-03-05 DIAGNOSIS — J439 Emphysema, unspecified: Secondary | ICD-10-CM | POA: Diagnosis not present

## 2023-03-05 DIAGNOSIS — I10 Essential (primary) hypertension: Secondary | ICD-10-CM | POA: Diagnosis not present

## 2023-03-05 DIAGNOSIS — R609 Edema, unspecified: Secondary | ICD-10-CM | POA: Diagnosis not present

## 2023-03-05 DIAGNOSIS — D509 Iron deficiency anemia, unspecified: Secondary | ICD-10-CM | POA: Diagnosis not present

## 2023-03-05 DIAGNOSIS — M159 Polyosteoarthritis, unspecified: Secondary | ICD-10-CM | POA: Diagnosis not present

## 2023-03-05 DIAGNOSIS — N183 Chronic kidney disease, stage 3 unspecified: Secondary | ICD-10-CM | POA: Diagnosis not present

## 2023-03-05 DIAGNOSIS — N400 Enlarged prostate without lower urinary tract symptoms: Secondary | ICD-10-CM | POA: Diagnosis not present

## 2023-03-05 DIAGNOSIS — L309 Dermatitis, unspecified: Secondary | ICD-10-CM | POA: Diagnosis not present

## 2023-03-05 DIAGNOSIS — G629 Polyneuropathy, unspecified: Secondary | ICD-10-CM | POA: Diagnosis not present

## 2023-03-14 DIAGNOSIS — G629 Polyneuropathy, unspecified: Secondary | ICD-10-CM | POA: Diagnosis not present

## 2023-03-14 DIAGNOSIS — J439 Emphysema, unspecified: Secondary | ICD-10-CM | POA: Diagnosis not present

## 2023-03-14 DIAGNOSIS — M159 Polyosteoarthritis, unspecified: Secondary | ICD-10-CM | POA: Diagnosis not present

## 2023-03-14 DIAGNOSIS — R609 Edema, unspecified: Secondary | ICD-10-CM | POA: Diagnosis not present

## 2023-03-14 DIAGNOSIS — N400 Enlarged prostate without lower urinary tract symptoms: Secondary | ICD-10-CM | POA: Diagnosis not present

## 2023-03-14 DIAGNOSIS — N183 Chronic kidney disease, stage 3 unspecified: Secondary | ICD-10-CM | POA: Diagnosis not present

## 2023-03-14 DIAGNOSIS — L309 Dermatitis, unspecified: Secondary | ICD-10-CM | POA: Diagnosis not present

## 2023-03-14 DIAGNOSIS — I10 Essential (primary) hypertension: Secondary | ICD-10-CM | POA: Diagnosis not present

## 2023-03-14 DIAGNOSIS — D509 Iron deficiency anemia, unspecified: Secondary | ICD-10-CM | POA: Diagnosis not present

## 2023-03-19 DIAGNOSIS — J439 Emphysema, unspecified: Secondary | ICD-10-CM | POA: Diagnosis not present

## 2023-03-19 DIAGNOSIS — N400 Enlarged prostate without lower urinary tract symptoms: Secondary | ICD-10-CM | POA: Diagnosis not present

## 2023-03-19 DIAGNOSIS — N183 Chronic kidney disease, stage 3 unspecified: Secondary | ICD-10-CM | POA: Diagnosis not present

## 2023-03-19 DIAGNOSIS — I10 Essential (primary) hypertension: Secondary | ICD-10-CM | POA: Diagnosis not present

## 2023-03-19 DIAGNOSIS — M159 Polyosteoarthritis, unspecified: Secondary | ICD-10-CM | POA: Diagnosis not present

## 2023-03-19 DIAGNOSIS — R609 Edema, unspecified: Secondary | ICD-10-CM | POA: Diagnosis not present

## 2023-03-19 DIAGNOSIS — D509 Iron deficiency anemia, unspecified: Secondary | ICD-10-CM | POA: Diagnosis not present

## 2023-03-19 DIAGNOSIS — E871 Hypo-osmolality and hyponatremia: Secondary | ICD-10-CM | POA: Diagnosis not present

## 2023-03-19 DIAGNOSIS — L309 Dermatitis, unspecified: Secondary | ICD-10-CM | POA: Diagnosis not present

## 2023-04-02 ENCOUNTER — Other Ambulatory Visit: Payer: Medicare HMO

## 2023-04-02 ENCOUNTER — Ambulatory Visit: Payer: Medicare HMO | Admitting: Oncology

## 2023-04-08 NOTE — Progress Notes (Signed)
[facility]  [address] [place],  [place]  [address] [phone]  Clinic Day:  04/09/2023  HISTORY OF PRESENT ILLNESS:  The patient is a 77 y.o. male with recurrent/locally advanced squamous cell skin cancer.  He completed his definitive chemoradiation in October 2021, which consisted of 3 cycles of cisplatin.  He comes in today for routine follow-up.  Since his last visit, the patient has been doing fine.  Of note, small squamous cell skin cancers have been removed from his scalp and leg recently.  However, he denies having any new symptoms or findings which concern him for more advanced disease being present.  Of note, this patient was also has a history of  iron deficiency anemia, for which IV iron has been previously given.  He continues to deny having any overt forms of blood loss since his last visit  PHYSICAL EXAM:  Blood pressure (!) 185/106, pulse 93, temperature 98 F (36.7 C), resp. rate 16, height  (1.753 m), weight 174 lb 9.6 oz (79.2 kg), SpO2 92 %. Wt Readings from Last 3 Encounters:  04/09/23 174 lb 9.6 oz (79.2 kg)  10/01/22 167 lb 8 oz (76 kg)  05/30/22 155 lb 9.6 oz (70.6 kg)   Body mass index is 25.78 kg/m. Performance status (ECOG): 1 - Symptomatic but completely ambulatory Physical Exam Constitutional:      General: He is not in acute distress.    Appearance: Normal appearance. He is normal weight.  HENT:     Head: Normocephalic and atraumatic.     Comments: Reconstructed right side of his face looks unchanged in appearance Eyes:     General: No scleral icterus.    Extraocular Movements: Extraocular movements intact.     Conjunctiva/sclera: Conjunctivae normal.     Pupils: Pupils are equal, round, and reactive to light.  Cardiovascular:     Rate and Rhythm: Normal rate and regular rhythm.     Pulses: Normal pulses.     Heart sounds: Normal heart sounds. No murmur heard.    No friction rub. No gallop.  Pulmonary:      Effort: Pulmonary effort is normal. No respiratory distress.     Breath sounds: Normal breath sounds.  Abdominal:     General: Bowel sounds are normal. There is no distension.     Palpations: Abdomen is soft. There is no hepatomegaly, splenomegaly or mass.     Tenderness: There is no abdominal tenderness.  Musculoskeletal:        General: Normal range of motion.     Cervical back: Normal range of motion and neck supple.     Right lower leg: No edema.     Left lower leg: No edema.  Lymphadenopathy:     Cervical: No cervical adenopathy.  Skin:    General: Skin is warm and dry.  Neurological:     General: No focal deficit present.     Mental Status: He is alert and oriented to person, place, and time. Mental status is at baseline.  Psychiatric:        Mood and Affect: Mood normal.        Behavior: Behavior normal.        Thought Content: Thought content normal.        Judgment: Judgment normal.    LABS:    Latest Reference Range & Units 04/09/23 10:14  WBC 4.0 - 10.5 K/uL 8.2  RBC 4.22 - 5.81 MIL/uL 4.16 (L)  Hemoglobin 13.0 -  17.0 g/dL 16.1 (L)  HCT 09.6 - 04.5 % 38.9 (L)  MCV 80.0 - 100.0 fL 93.5  MCH 26.0 - 34.0 pg 29.8  MCHC 30.0 - 36.0 g/dL 40.9  RDW 81.1 - 91.4 % 13.8  Platelets 150 - 400 K/uL 289  nRBC 0.0 - 0.2 % 0.0  Neutrophils % 81  Lymphocytes % 6  Monocytes Relative % 9  Eosinophil % 0  Basophil % 1  Immature Granulocytes % 3  (L): Data is abnormally low   Latest Reference Range & Units 04/09/23 10:14  Sodium 135 - 145 mmol/L 137  Potassium 3.5 - 5.1 mmol/L 4.1  Chloride 98 - 111 mmol/L 101  CO2 22 - 32 mmol/L 26  Glucose 70 - 99 mg/dL 782 (H)  BUN 8 - 23 mg/dL 38 (H)  Creatinine 9.56 - 1.24 mg/dL 2.13 (H)  Calcium 8.9 - 10.3 mg/dL 8.5 (L)  Anion gap 5 - 15  10  Alkaline Phosphatase 38 - 126 U/L 45  Albumin 3.5 - 5.0 g/dL 4.0  AST 15 - 41 U/L 18  ALT 0 - 44 U/L 18  Total Protein 6.5 - 8.1 g/dL 7.5  Total Bilirubin 0.3 - 1.2 mg/dL 0.6  GFR, Est  Non African American >60 mL/min 56 (L)  (H): Data is abnormally high (L): Data is abnormally low  ASSESSMENT & PLAN:  Assessment/Plan:  A 77 y.o. male with recurrent/locally advanced squamous cell skin cancer.  Per his physical exam today, there remain no signs of disease recurrence.  He knows it remains important to follow-up with his dermatologist for his continued skin cancer surveillance. Clinically, he appears to be doing well.  I will see him back in 6 months for repeat clinical assessment.  The patient understands all the plans discussed today and is in agreement with them.  Carlita Whitcomb Kirby Funk, MD

## 2023-04-09 ENCOUNTER — Inpatient Hospital Stay: Payer: Medicare HMO | Attending: Oncology

## 2023-04-09 ENCOUNTER — Other Ambulatory Visit: Payer: Self-pay | Admitting: Oncology

## 2023-04-09 ENCOUNTER — Inpatient Hospital Stay: Payer: Medicare HMO | Admitting: Oncology

## 2023-04-09 VITALS — BP 185/106 | HR 93 | Temp 98.0°F | Resp 16 | Ht 69.0 in | Wt 174.6 lb

## 2023-04-09 DIAGNOSIS — C44329 Squamous cell carcinoma of skin of other parts of face: Secondary | ICD-10-CM

## 2023-04-09 DIAGNOSIS — C4492 Squamous cell carcinoma of skin, unspecified: Secondary | ICD-10-CM | POA: Insufficient documentation

## 2023-04-09 LAB — CBC WITH DIFFERENTIAL (CANCER CENTER ONLY)
Abs Immature Granulocytes: 0.2 10*3/uL — ABNORMAL HIGH (ref 0.00–0.07)
Basophils Absolute: 0 10*3/uL (ref 0.0–0.1)
Basophils Relative: 1 %
Eosinophils Absolute: 0 10*3/uL (ref 0.0–0.5)
Eosinophils Relative: 0 %
HCT: 38.9 % — ABNORMAL LOW (ref 39.0–52.0)
Hemoglobin: 12.4 g/dL — ABNORMAL LOW (ref 13.0–17.0)
Immature Granulocytes: 3 %
Lymphocytes Relative: 6 %
Lymphs Abs: 0.5 10*3/uL — ABNORMAL LOW (ref 0.7–4.0)
MCH: 29.8 pg (ref 26.0–34.0)
MCHC: 31.9 g/dL (ref 30.0–36.0)
MCV: 93.5 fL (ref 80.0–100.0)
Monocytes Absolute: 0.8 10*3/uL (ref 0.1–1.0)
Monocytes Relative: 9 %
Neutro Abs: 6.7 10*3/uL (ref 1.7–7.7)
Neutrophils Relative %: 81 %
Platelet Count: 289 10*3/uL (ref 150–400)
RBC: 4.16 MIL/uL — ABNORMAL LOW (ref 4.22–5.81)
RDW: 13.8 % (ref 11.5–15.5)
WBC Count: 8.2 10*3/uL (ref 4.0–10.5)
nRBC: 0 % (ref 0.0–0.2)

## 2023-04-09 LAB — CMP (CANCER CENTER ONLY)
ALT: 18 U/L (ref 0–44)
AST: 18 U/L (ref 15–41)
Albumin: 4 g/dL (ref 3.5–5.0)
Alkaline Phosphatase: 45 U/L (ref 38–126)
Anion gap: 10 (ref 5–15)
BUN: 38 mg/dL — ABNORMAL HIGH (ref 8–23)
CO2: 26 mmol/L (ref 22–32)
Calcium: 8.5 mg/dL — ABNORMAL LOW (ref 8.9–10.3)
Chloride: 101 mmol/L (ref 98–111)
Creatinine: 1.33 mg/dL — ABNORMAL HIGH (ref 0.61–1.24)
GFR, Estimated: 56 mL/min — ABNORMAL LOW (ref >60)
Glucose, Bld: 108 mg/dL — ABNORMAL HIGH (ref 70–99)
Potassium: 4.1 mmol/L (ref 3.5–5.1)
Sodium: 137 mmol/L (ref 135–145)
Total Bilirubin: 0.6 mg/dL (ref 0.3–1.2)
Total Protein: 7.5 g/dL (ref 6.5–8.1)

## 2023-10-08 NOTE — Progress Notes (Signed)
[facility]  [address] [place],  [place]  [address] [phone]  Clinic Day:  10/09/2023  HISTORY OF PRESENT ILLNESS:  The patient is a 77 y.o. male with recurrent/locally advanced squamous cell skin cancer.  He completed his definitive chemoradiation in October 2021, which consisted of 3 cycles of cisplatin.  He comes in today for routine follow-up.  Since his last visit, the patient has been doing fine.  Of note, small squamous cell skin cancers and other skin tags have been removed in the recent past.  He is seen by Dermatology frequently.   However, he denies having any new symptoms or findings which concern him for more advanced disease being present.    PHYSICAL EXAM:  Blood pressure (!) 182/93, pulse 97, temperature 97.8 F (36.6 C), resp. rate 18, height 5\' 9"  (1.753 m), weight 179 lb (81.2 kg), SpO2 92%. Wt Readings from Last 3 Encounters:  10/09/23 179 lb (81.2 kg)  04/09/23 174 lb 9.6 oz (79.2 kg)  10/01/22 167 lb 8 oz (76 kg)   Body mass index is 26.43 kg/m. Performance status (ECOG): 1 - Symptomatic but completely ambulatory Physical Exam Constitutional:      General: He is not in acute distress.    Appearance: Normal appearance. He is normal weight.  HENT:     Head: Normocephalic and atraumatic.     Comments: Reconstructed right side of his face looks unchanged in appearance Eyes:     General: No scleral icterus.    Extraocular Movements: Extraocular movements intact.     Conjunctiva/sclera: Conjunctivae normal.     Pupils: Pupils are equal, round, and reactive to light.  Cardiovascular:     Rate and Rhythm: Normal rate and regular rhythm.     Pulses: Normal pulses.     Heart sounds: Normal heart sounds. No murmur heard.    No friction rub. No gallop.  Pulmonary:     Effort: Pulmonary effort is normal. No respiratory distress.     Breath sounds: Normal breath sounds.  Abdominal:     General: Bowel sounds are normal. There  is no distension.     Palpations: Abdomen is soft. There is no hepatomegaly, splenomegaly or mass.     Tenderness: There is no abdominal tenderness.  Musculoskeletal:        General: Normal range of motion.     Cervical back: Normal range of motion and neck supple.     Right lower leg: No edema.     Left lower leg: No edema.  Lymphadenopathy:     Cervical: No cervical adenopathy.  Skin:    General: Skin is warm and dry.  Neurological:     General: No focal deficit present.     Mental Status: He is alert and oriented to person, place, and time. Mental status is at baseline.  Psychiatric:        Mood and Affect: Mood normal.        Behavior: Behavior normal.        Thought Content: Thought content normal.        Judgment: Judgment normal.    LABS:    Latest Reference Range & Units 10/09/23 00:00 10/09/23 10:00  Sodium 135 - 145 mmol/L  135  Potassium 3.5 - 5.1 mmol/L  4.2  Chloride 98 - 111 mmol/L  98  CO2 22 - 32 mmol/L  25  Glucose 70 - 99 mg/dL  92  BUN 8 - 23 mg/dL  31 (  H)  Creatinine 0.61 - 1.24 mg/dL  3.01  Calcium 8.9 - 60.1 mg/dL  8.9  Anion gap 5 - 15   12  Alkaline Phosphatase 38 - 126 U/L  49  Albumin 3.5 - 5.0 g/dL  3.7  AST 15 - 41 U/L  22  ALT 0 - 44 U/L  17  Total Protein 6.5 - 8.1 g/dL  7.0  Total Bilirubin 0.3 - 1.2 mg/dL  0.7  GFR, Est Non African American >60 mL/min  >60  CBC W DIFFERENTIAL ([facility] SCANNED REPORT)   Rpt (IP)  WBC  9.7 (E)   RBC 3.87 - 5.11  3.86 ! (E)   Hemoglobin 13.5 - 17.5  11.7 ! (E)   HCT 41 - 53  35 ! (E)   Platelets 150 - 400 K/uL 242 (E)   NEUT#  7.95 (E)   TSH 0.350 - 4.500 uIU/mL  2.116  (H): Data is abnormally high !: Data is abnormal  ASSESSMENT & PLAN:  Assessment/Plan:  A 77 y.o. male with recurrent/locally advanced squamous cell skin cancer.  Per his physical exam today, there remain no signs of disease recurrence.  He knows it remains important to follow with his dermatologist for his continued skin cancer  surveillance. Clinically, he appears to be doing well.  I will see him back in 6 months for repeat clinical assessment.  The patient understands all the plans discussed today and is in agreement with them.  Tydus Sanmiguel Kirby Funk, MD

## 2023-10-09 ENCOUNTER — Inpatient Hospital Stay: Payer: Medicare HMO

## 2023-10-09 ENCOUNTER — Inpatient Hospital Stay: Payer: Medicare HMO | Attending: Oncology | Admitting: Oncology

## 2023-10-09 ENCOUNTER — Other Ambulatory Visit: Payer: Self-pay | Admitting: Oncology

## 2023-10-09 VITALS — BP 182/93 | HR 97 | Temp 97.8°F | Resp 18 | Ht 69.0 in | Wt 179.0 lb

## 2023-10-09 DIAGNOSIS — C4492 Squamous cell carcinoma of skin, unspecified: Secondary | ICD-10-CM | POA: Diagnosis present

## 2023-10-09 DIAGNOSIS — C44329 Squamous cell carcinoma of skin of other parts of face: Secondary | ICD-10-CM | POA: Diagnosis not present

## 2023-10-09 DIAGNOSIS — Z923 Personal history of irradiation: Secondary | ICD-10-CM | POA: Diagnosis not present

## 2023-10-09 DIAGNOSIS — Z79899 Other long term (current) drug therapy: Secondary | ICD-10-CM | POA: Diagnosis not present

## 2023-10-09 LAB — CMP (CANCER CENTER ONLY)
ALT: 17 U/L (ref 0–44)
AST: 22 U/L (ref 15–41)
Albumin: 3.7 g/dL (ref 3.5–5.0)
Alkaline Phosphatase: 49 U/L (ref 38–126)
Anion gap: 12 (ref 5–15)
BUN: 31 mg/dL — ABNORMAL HIGH (ref 8–23)
CO2: 25 mmol/L (ref 22–32)
Calcium: 8.9 mg/dL (ref 8.9–10.3)
Chloride: 98 mmol/L (ref 98–111)
Creatinine: 1.24 mg/dL (ref 0.61–1.24)
GFR, Estimated: >60 mL/min (ref >60)
Glucose, Bld: 92 mg/dL (ref 70–99)
Potassium: 4.2 mmol/L (ref 3.5–5.1)
Sodium: 135 mmol/L (ref 135–145)
Total Bilirubin: 0.7 mg/dL (ref 0.3–1.2)
Total Protein: 7 g/dL (ref 6.5–8.1)

## 2023-10-09 LAB — CBC AND DIFFERENTIAL
HCT: 35 — AB (ref 41–53)
Hemoglobin: 11.7 — AB (ref 13.5–17.5)
Neutrophils Absolute: 7.95
Platelets: 242 10*3/uL (ref 150–400)
WBC: 9.7

## 2023-10-09 LAB — CBC: RBC: 3.86 — AB (ref 3.87–5.11)

## 2023-10-09 LAB — TSH: TSH: 2.116 u[IU]/mL (ref 0.350–4.500)

## 2023-10-15 ENCOUNTER — Encounter: Payer: Self-pay | Admitting: Oncology

## 2024-02-16 ENCOUNTER — Ambulatory Visit (HOSPITAL_BASED_OUTPATIENT_CLINIC_OR_DEPARTMENT_OTHER): Payer: Medicare HMO | Admitting: Radiology

## 2024-02-16 ENCOUNTER — Ambulatory Visit (HOSPITAL_BASED_OUTPATIENT_CLINIC_OR_DEPARTMENT_OTHER)
Admission: EM | Admit: 2024-02-16 | Discharge: 2024-02-16 | Disposition: A | Discharge status: Home / Self Care | Payer: Medicare HMO | Attending: Family Medicine | Admitting: Family Medicine

## 2024-02-16 ENCOUNTER — Encounter (HOSPITAL_BASED_OUTPATIENT_CLINIC_OR_DEPARTMENT_OTHER): Payer: Self-pay

## 2024-02-16 ENCOUNTER — Ambulatory Visit (HOSPITAL_BASED_OUTPATIENT_CLINIC_OR_DEPARTMENT_OTHER)
Admit: 2024-02-16 | Discharge: 2024-02-16 | Disposition: A | Discharge status: Home / Self Care | Payer: Medicare HMO | Attending: Family Medicine | Admitting: Radiology

## 2024-02-16 DIAGNOSIS — R140 Abdominal distension (gaseous): Secondary | ICD-10-CM | POA: Diagnosis not present

## 2024-02-16 DIAGNOSIS — S301XXA Contusion of abdominal wall, initial encounter: Secondary | ICD-10-CM | POA: Diagnosis not present

## 2024-02-16 DIAGNOSIS — R109 Unspecified abdominal pain: Secondary | ICD-10-CM

## 2024-02-16 DIAGNOSIS — R1031 Right lower quadrant pain: Secondary | ICD-10-CM

## 2024-02-16 DIAGNOSIS — R1011 Right upper quadrant pain: Secondary | ICD-10-CM

## 2024-02-16 MED ORDER — MELOXICAM 7.5 MG PO TABS: 7.5000 mg | ORAL_TABLET | Freq: Two times a day (BID) | ORAL | 0 refills | Status: AC | PRN

## 2024-02-16 NOTE — ED Provider Notes (Signed)
 Phillip Logan [facility]    CSN: [id number] Arrival date & time: 02/16/24  1242      History   Chief Complaint Chief Complaint  Patient presents with   abnormal skin color    HPI Phillip Logan is a 78 y.o. male.   Patient is here with his wife.  He and his wife are both providing the medical history.  He has some swelling, tenderness and bruising on his right upper and lower abdomen and flank.  This bruising is a week or more old.  Initially he did not remember an injury.  After some time of talking he remembered approximately 5 to 7 days before the bruising was noticed, he fell outside while feeling his birdfeeders.  He was on the ground and could not get up.  EMS had to be called to get him up.  He thinks that fall predated the bruise by at least 5 and maybe 7 days but he is not 100% sure.  He has a cancer patient and has had surgery on his right face for squamous cell carcinoma.  He was advised by either his family practice or the cancer center to come and get further workup.  His family practice provider has seen this but did not think it was a serious issue.     Past Medical History:  Diagnosis Date   Squamous cell carcinoma of chin     Patient Active Problem List   Diagnosis Date Noted   Iron deficiency anemia 01/28/2022   Furuncle of scalp 10/01/2021   Hypomagnesemia 10/05/2020   Cellulitis 10/02/2020   Squamous cell carcinoma of skin of other parts of face 09/10/2020    History reviewed. No pertinent surgical history.     Home Medications    Prior to Admission medications   Medication Sig Start Date End Date Taking? Authorizing Provider  meloxicam (MOBIC) 7.5 MG tablet Take 1 tablet (7.5 mg total) by mouth 2 (two) times daily as needed for up to 15 days for pain. 02/16/24 03/02/24 Yes Prescilla Sours, FNP  albuterol (VENTOLIN HFA) 108 (90 Base) MCG/ACT inhaler Inhale into the lungs every 6 (six) hours as needed for wheezing or shortness of breath.     [provider]  apixaban (ELIQUIS) 2.5 MG TABS tablet Take by mouth 2 (two) times daily.    [provider]  benzonatate (TESSALON) 100 MG capsule Take by mouth 3 (three) times daily as needed for cough.    [provider]  diphenhydrAMINE (BENADRYL) 25 mg capsule Take 25 mg by mouth every 6 (six) hours as needed.    [provider]  famotidine (PEPCID) 20 MG tablet Take 20 mg by mouth 2 (two) times daily.    [provider]  furosemide (LASIX) 20 MG tablet Take 20 mg by mouth.    [provider]  gabapentin (NEURONTIN) 300 MG capsule Take 300 mg by mouth 2 (two) times daily.    [provider]  hydrOXYzine (ATARAX/VISTARIL) 25 MG tablet Take 25 mg by mouth 3 (three) times daily as needed.    [provider]  lidocaine (XYLOCAINE) 2 % solution Use as directed 15 mLs in the mouth or throat as needed for mouth pain.    [provider]  magic mouthwash SOLN Take 5 mLs by mouth.    [provider]  Multiple Vitamins-Minerals (MULTIVITAMIN WITH MINERALS) tablet Take 1 tablet by mouth daily.    [provider]  nebivolol (BYSTOLIC) 5 MG tablet Take  5 mg by mouth daily.    [provider]  ondansetron (ZOFRAN) 4 MG tablet Take 4 mg by mouth every 8 (eight) hours as needed for nausea or vomiting.    [provider]  potassium chloride SA (KLOR-CON) 20 MEQ tablet Take 20 mEq by mouth once.    [provider]  prochlorperazine (COMPAZINE) 10 MG tablet Take 10 mg by mouth every 6 (six) hours as needed for nausea or vomiting.    [provider]  tamsulosin (FLOMAX) 0.4 MG CAPS capsule Take 0.4 mg by mouth.    [provider]  thiamine 100 MG tablet Take 100 mg by mouth daily.    [provider]    Family History History reviewed. No pertinent family history.  Social History Social History   Tobacco Use   Smoking status: Former   Smokeless tobacco:  Never  Substance Use Topics   Alcohol use: Not Currently   Drug use: Not Currently     Allergies   Ciprofloxacin, Clindamycin/lincomycin, Clonidine derivatives, Cyproheptadine, Lisinopril, Myrbetriq [mirabegron], Penicillins, and Streptokinases   Review of Systems Review of Systems  Constitutional:  Negative for chills and fever.  HENT:  Negative for ear pain and sore throat.   Eyes:  Negative for pain and visual disturbance.  Respiratory:  Negative for cough.   Cardiovascular:  Negative for chest pain and palpitations.  Gastrointestinal:  Negative for abdominal pain, constipation, diarrhea, nausea and vomiting.  Genitourinary:  Negative for dysuria and hematuria.  Musculoskeletal:  Negative for arthralgias and back pain.  Skin:  Positive for color change (Large area of ecchymosis and contusion on the right flank, right upper and right lower abdomen.). Negative for rash.  Neurological:  Negative for seizures and syncope.  All other systems reviewed and are negative.    Physical Exam Triage Vital Signs ED Triage Vitals  Encounter Vitals Group     BP 02/16/24 1326 (!) 157/79     Systolic BP Percentile --      Diastolic BP Percentile --      Pulse Rate 02/16/24 1326 (!) 112     Resp 02/16/24 1326 20     Temp 02/16/24 1326 99.4 F (37.4 C)     Temp Source 02/16/24 1326 Temporal     SpO2 02/16/24 1326 96 %     Weight --      Height --      Head Circumference --      Peak Flow --      Pain Score 02/16/24 1329 9     Pain Loc --      Pain Education --      Exclude from Growth Chart --    No data found.  Updated Vital Signs BP (!) 157/79 (BP Location: Right Arm)   Pulse (!) 112   Temp 99.4 F (37.4 C) (Temporal)   Resp 20   SpO2 96%   Visual Acuity Right Eye Distance:   Left Eye Distance:   Bilateral Distance:    Right Eye Near:   Left Eye Near:    Bilateral Near:     Physical Exam Vitals and nursing note reviewed.  Constitutional:      General: He is not  in acute distress.    Appearance: He is well-developed. He is not ill-appearing or toxic-appearing.  HENT:     Head: Normocephalic and atraumatic.     Left Ear: Hearing, tympanic membrane, ear canal and external ear normal.     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  Her right ear and a large area of skin was removed associated with the surgery for the skin cancer on his right face.  He has had a skin graft placed.  There is some swelling under the skin graft that is old.    Nose: No congestion or rhinorrhea.     Right Sinus: No maxillary sinus tenderness or frontal sinus tenderness.     Left Sinus: No maxillary sinus tenderness or frontal sinus tenderness.     Mouth/Throat:     Lips: Pink.     Mouth: Mucous membranes are moist.     Pharynx: Uvula midline. No oropharyngeal exudate or posterior oropharyngeal erythema.     Tonsils: No tonsillar exudate.     Comments: Right side of face with swelling secondary to previous surgery for carcinoma.  He has no upper teeth and some of the contours on the right side of his mouth are different secondary to that previous surgery. Eyes:     Conjunctiva/sclera: Conjunctivae normal.     Pupils: Pupils are equal, round, and reactive to light.  Cardiovascular:     Rate and Rhythm: Normal rate and regular rhythm.     Heart sounds: S1 normal and S2 normal. No murmur heard. Pulmonary:     Effort: Pulmonary effort is normal. No respiratory distress.     Breath sounds: Normal breath sounds. No decreased breath sounds, wheezing, rhonchi or rales.  Abdominal:     General: Bowel sounds are normal. There is distension (Obese).     Palpations: Abdomen is soft.     Tenderness: There is abdominal tenderness in the right upper quadrant and right lower quadrant. There is right CVA tenderness. There is no left CVA tenderness, guarding or rebound. Negative signs include Murphy's sign, Rovsing's sign and McBurney's sign.  Musculoskeletal:        General: No swelling.      Cervical back: Neck supple.  Lymphadenopathy:     Head:     Right side of head: No submental, submandibular, tonsillar, preauricular or posterior auricular adenopathy.     Left side of head: No submental, submandibular, tonsillar, preauricular or posterior auricular adenopathy.     Cervical: No cervical adenopathy.     Right cervical: No superficial cervical adenopathy.    Left cervical: No superficial cervical adenopathy.  Skin:    General: Skin is warm and dry.     Capillary Refill: Capillary refill takes less than 2 seconds.     Findings: Ecchymosis (Large area on right abdomen that extends from 4 inches from the spine around to the umbilicus and extends from the lower ribs on the right to the right groin fold.  This area is purple but is at least a week old and already draining towards the groin.) present. No rash.  Neurological:     Mental Status: He is alert and oriented to person, place, and time.  Psychiatric:        Mood and Affect: Mood normal.      UC Treatments / Results  Labs (all labs ordered are listed, but only abnormal results are displayed) Labs Reviewed - No data to display  EKG   Radiology No results found.  Procedures Procedures (including critical care time)  Medications Ordered in UC Medications - No data to display  Initial Impression / Assessment and Plan / UC Course  I have reviewed the triage vital signs and the nursing notes.  Pertinent labs & imaging results that  were available during my care of the patient were reviewed by me and considered in my medical decision making (see chart for details).     X-rays appear negative or normal.  Will update the patient if the radiology impression differs.  This is a contusion to the abdominal wall, most likely secondary from underlying hematoma extending upwards..  He does not agree with her.  But he did require EMS to help get him up.  Encouraged warm compresses, 30 minutes on 30 minutes off for  several weeks.  Provided meloxicam, 7.5 mg, twice daily if needed for pain.  Follow-up if symptoms do not improve, worsen or new symptoms occur.  May need further workup or may need to see family practice for management. Final Clinical Impressions(s) / UC Diagnoses   Final diagnoses:  Right lower quadrant abdominal pain  Right upper quadrant abdominal pain  Contusion of abdominal wall, initial encounter     Discharge Instructions      X-rays appear normal or negative.  Will update the patient if radiology impression differs.  This is a contusion to the abdominal wall most likely secondary to trauma when he fell at home and his garden while fixing his bird feeder.  Encouraged warm compresses, 30 minutes on 30 minutes off, for several weeks.  Follow-up if symptoms do not improve, worsen or new symptoms occur.  If symptoms worsen he may need to go to an emergency room to get a CT scan.  Provided meloxicam, 7.5 mg, twice daily, take with food, if needed for pain.     ED Prescriptions     Medication Sig Dispense Auth. Provider   meloxicam (MOBIC) 7.5 MG tablet Take 1 tablet (7.5 mg total) by mouth 2 (two) times daily as needed for up to 15 days for pain. 30 tablet Prescilla Sours, FNP      PDMP not reviewed this encounter.   Prescilla Sours, FNP 02/16/24 1440

## 2024-02-16 NOTE — Discharge Instructions (Addendum)
 X-rays appear normal or negative.  Will update the patient if radiology impression differs.  This is a contusion to the abdominal wall most likely secondary to trauma when he fell at home and his garden while fixing his bird feeder.  Encouraged warm compresses, 30 minutes on 30 minutes off, for several weeks.  Follow-up if symptoms do not improve, worsen or new symptoms occur.  If symptoms worsen he may need to go to an emergency room to get a CT scan.  Provided meloxicam, 7.5 mg, twice daily, take with food, if needed for pain.

## 2024-02-16 NOTE — Progress Notes (Signed)
 Reviewed

## 2024-02-16 NOTE — ED Triage Notes (Addendum)
 Patient is a cancer patient. 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 Saw family doctor last week for pcp assessment. Patient presents stating severe pain with skin to right side looking red. No rash.

## 2024-02-16 NOTE — Progress Notes (Signed)
 Abdominal X-Ray IMPRESSION:  Large colonic stool burden. No bowel obstruction.  Abdominal Ultrasound IMPRESSION:  Limited examination as above. No obvious underlying mass lesion or fluid collection by limited ultrasound. Additional workup as clinically appropriate such as CT for further sensitivity  Patient updated.  Encouraged to get empty of stool with laxatives and see his PCP, if this area continues to hurt.

## 2024-03-23 ENCOUNTER — Other Ambulatory Visit: Payer: Self-pay | Admitting: Oncology

## 2024-03-23 ENCOUNTER — Inpatient Hospital Stay (HOSPITAL_BASED_OUTPATIENT_CLINIC_OR_DEPARTMENT_OTHER): Admitting: Oncology

## 2024-03-23 ENCOUNTER — Inpatient Hospital Stay: Attending: Oncology

## 2024-03-23 ENCOUNTER — Other Ambulatory Visit: Payer: Self-pay

## 2024-03-23 VITALS — BP 128/73 | HR 104 | Temp 97.6°F | Resp 20 | Ht 67.0 in | Wt 188.2 lb

## 2024-03-23 DIAGNOSIS — C44329 Squamous cell carcinoma of skin of other parts of face: Secondary | ICD-10-CM

## 2024-03-23 DIAGNOSIS — Z9221 Personal history of antineoplastic chemotherapy: Secondary | ICD-10-CM | POA: Diagnosis not present

## 2024-03-23 DIAGNOSIS — Z79899 Other long term (current) drug therapy: Secondary | ICD-10-CM | POA: Diagnosis not present

## 2024-03-23 DIAGNOSIS — Z85828 Personal history of other malignant neoplasm of skin: Secondary | ICD-10-CM | POA: Diagnosis present

## 2024-03-23 DIAGNOSIS — Z923 Personal history of irradiation: Secondary | ICD-10-CM | POA: Insufficient documentation

## 2024-03-23 LAB — CBC WITH DIFFERENTIAL (CANCER CENTER ONLY)
Abs Immature Granulocytes: 0.11 10*3/uL — ABNORMAL HIGH (ref 0.00–0.07)
Basophils Absolute: 0 10*3/uL (ref 0.0–0.1)
Basophils Relative: 0 %
Eosinophils Absolute: 0.1 10*3/uL (ref 0.0–0.5)
Eosinophils Relative: 1 %
HCT: 36.5 % — ABNORMAL LOW (ref 39.0–52.0)
Hemoglobin: 11.4 g/dL — ABNORMAL LOW (ref 13.0–17.0)
Immature Granulocytes: 2 %
Lymphocytes Relative: 8 %
Lymphs Abs: 0.5 10*3/uL — ABNORMAL LOW (ref 0.7–4.0)
MCH: 28.9 pg (ref 26.0–34.0)
MCHC: 31.2 g/dL (ref 30.0–36.0)
MCV: 92.6 fL (ref 80.0–100.0)
Monocytes Absolute: 0.6 10*3/uL (ref 0.1–1.0)
Monocytes Relative: 10 %
Neutro Abs: 4.8 10*3/uL (ref 1.7–7.7)
Neutrophils Relative %: 79 %
Platelet Count: 202 10*3/uL (ref 150–400)
RBC: 3.94 MIL/uL — ABNORMAL LOW (ref 4.22–5.81)
RDW: 15.4 % (ref 11.5–15.5)
WBC Count: 6 10*3/uL (ref 4.0–10.5)
nRBC: 0 % (ref 0.0–0.2)
nRBC: 0 /100{WBCs}

## 2024-03-23 LAB — CMP (CANCER CENTER ONLY)
ALT: 16 U/L (ref 0–44)
AST: 26 U/L (ref 15–41)
Albumin: 4.4 g/dL (ref 3.5–5.0)
Alkaline Phosphatase: 105 U/L (ref 38–126)
Anion gap: 13 (ref 5–15)
BUN: 24 mg/dL — ABNORMAL HIGH (ref 8–23)
CO2: 26 mmol/L (ref 22–32)
Calcium: 9.3 mg/dL (ref 8.9–10.3)
Chloride: 99 mmol/L (ref 98–111)
Creatinine: 1.55 mg/dL — ABNORMAL HIGH (ref 0.61–1.24)
GFR, Estimated: 46 mL/min — ABNORMAL LOW (ref >60)
Glucose, Bld: 121 mg/dL — ABNORMAL HIGH (ref 70–99)
Potassium: 4.4 mmol/L (ref 3.5–5.1)
Sodium: 138 mmol/L (ref 135–145)
Total Bilirubin: 0.4 mg/dL (ref 0.0–1.2)
Total Protein: 7.1 g/dL (ref 6.5–8.1)

## 2024-03-23 LAB — TSH: TSH: 1.638 u[IU]/mL (ref 0.350–4.500)

## 2024-03-23 NOTE — Progress Notes (Signed)
 [facility]  [address] [place],  [place]  [address] [phone]  Clinic Day:  10/09/2023  HISTORY OF PRESENT ILLNESS:  The patient is a 78 y.o. Logan with recurrent/locally advanced squamous cell skin cancer.  He completed his definitive chemoradiation in October 2021, which consisted of 3 cycles of cisplatin.  He comes in today for routine follow-up.  Since his last visit, the patient has been doing fine.  However, he is concerned about the transplanted muscle that was used to reconstruct the posterior right side of his face.  It appears to be losing its tautness.  As it pertains to his skin cancer, he denies having any new symptoms or findings which concern him for more advanced disease being present.    PHYSICAL EXAM:  Blood pressure 128/73, pulse (!) 104, temperature 97.6 F (36.4 C), temperature source Oral, resp. rate 20, height 5\' 7"  (1.702 m), weight 188 lb 3.2 oz (85.4 kg), SpO2 98%. Wt Readings from Last 3 Encounters:  03/23/24 188 lb 3.2 oz (85.4 kg)  10/09/23 179 lb (81.2 kg)  04/09/23 174 lb 9.6 oz (79.2 kg)   Body mass index is 29.48 kg/m. Performance status (ECOG): 1 - Symptomatic but completely ambulatory Physical Exam Constitutional:      General: He is not in acute distress.    Appearance: Normal appearance. He is normal weight.  HENT:     Head: Normocephalic and atraumatic.     Comments: The muscle used to reconstruct the right side of his face appears to be very floppy and softer upon palpation.   Eyes:     General: No scleral icterus.    Extraocular Movements: Extraocular movements intact.     Conjunctiva/sclera: Conjunctivae normal.     Pupils: Pupils are equal, round, and reactive to light.  Cardiovascular:     Rate and Rhythm: Normal rate and regular rhythm.     Pulses: Normal pulses.     Heart sounds: Normal heart sounds. No murmur heard.    No friction rub. No gallop.  Pulmonary:     Effort: Pulmonary effort is  normal. No respiratory distress.     Breath sounds: Normal breath sounds.  Abdominal:     General: Bowel sounds are normal. There is no distension.     Palpations: Abdomen is soft. There is no hepatomegaly, splenomegaly or mass.     Tenderness: There is no abdominal tenderness.  Musculoskeletal:        General: Normal range of motion.     Cervical back: Normal range of motion and neck supple.     Right lower leg: No edema.     Left lower leg: No edema.  Lymphadenopathy:     Cervical: No cervical adenopathy.  Skin:    General: Skin is warm and dry.  Neurological:     General: No focal deficit present.     Mental Status: He is alert and oriented to person, place, and time. Mental status is at baseline.  Psychiatric:        Mood and Affect: Mood normal.        Behavior: Behavior normal.        Thought Content: Thought content normal.        Judgment: Judgment normal.    LABS:    Latest Reference Range & Units 03/23/24 09:36  WBC 4.0 - 10.5 K/uL 6.0  RBC 4.22 - 5.81 MIL/uL 3.94 (L)  Hemoglobin 13.0 - 17.0 g/dL 91.4 (L)  HCT  39.0 - 52.0 % 36.5 (L)  MCV 80.0 - 100.0 fL 92.6  MCH 26.0 - 34.0 pg 28.9  MCHC 30.0 - 36.0 g/dL 16.1  RDW 09.6 - 04.5 % Phillip.4  Platelets 150 - 400 K/uL 202  nRBC 0.0 - 0.2 % 0 /100 WBC 0.00 0  Neutrophils % 79  Lymphocytes % 8  Monocytes Relative % 10  Eosinophil % 1  Basophil % 0  Immature Granulocytes % 2  NEUT# 1.7 - 7.7 K/uL 4.8  Lymphs Abs 0.7 - 4.0 K/uL 0.5 (L)  Monocyte # 0.1 - 1.0 K/uL 0.6  Eosinophils Absolute 0.0 - 0.5 K/uL 0.1  Basophils Absolute 0.0 - 0.1 K/uL 0.0  Abs Immature Granulocytes 0.00 - 0.07 K/uL 0.11 (H)  (L): Data is abnormally low (H): Data is abnormally high  Latest Reference Range & Units 03/23/24 09:36  Sodium 135 - 145 mmol/L 138  Potassium 3.5 - 5.1 mmol/L 4.4  Chloride 98 - 111 mmol/L 99  CO2 22 - 32 mmol/L 26  Glucose 70 - 99 mg/dL 409 (H)  BUN 8 - 23 mg/dL 24 (H)  Creatinine 8.11 - 1.24 mg/dL 9.14 (H)   Calcium 8.9 - 10.3 mg/dL 9.3  Anion gap 5 - Phillip  13  Alkaline Phosphatase 38 - 126 U/L 105  Albumin 3.5 - 5.0 g/dL 4.4  AST Phillip - 41 U/L 26  ALT 0 - 44 U/L 16  Total Protein 6.5 - 8.1 g/dL 7.1  Total Bilirubin 0.0 - 1.2 mg/dL 0.4  GFR, Est Non African American >60 mL/min 46 (L)  (H): Data is abnormally high (L): Data is abnormally low  Assessment/Plan:  A 78 y.o. Logan with recurrent/locally advanced squamous cell skin cancer.  Per his physical exam today, there remain no signs of disease recurrence.  He knows it remains important to follow with his dermatologist for his continued skin cancer surveillance.  However, I am concerned his muscle transplant used to construct his right posterior face/scalp may be atrophying.  Our office will refer him to a local surgeon to deem whether or not his muscle flap is still viable.  Otherwise, he clinically appears to be doing well.  I will see him back in 6 months for repeat clinical assessment.  The patient understands all the plans discussed today and is in agreement with them.  Kieanna Rollo Kirby Funk, MD

## 2024-04-08 ENCOUNTER — Other Ambulatory Visit: Payer: Medicare HMO

## 2024-04-08 ENCOUNTER — Ambulatory Visit: Payer: Medicare HMO | Admitting: Oncology

## 2024-04-15 NOTE — Progress Notes (Signed)
 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  Past medical history significant for squamous cell carcinoma status post chemoradiation, COPD, hypertension, clotting disorder, prior ACDF at C4-5, C5-6, C6-7.  Patient presents today regarding his chronic bilateral low back pain with sciatica.  He states this has been a problem for years but only flares up intermittently.  His most recent flareup is lasted 3 weeks which is longer than usual, usually resolves on its own.  Pain is in his bilateral low back along his belt line and will radiate down his bilateral lower extremities right worse than left.  Pain travels down the lateral parts of his thighs to the level of his knees, does not really go past the knees.  He has some additional numbness tingling in his fingertips and toes which has been attributed to his chemotherapy treatment.  Patient ambulates with a cane and feels generally pretty unstable, he has worse pain when he is standing and walking around, does relieve somewhat when he sits down especially his leg pain.  His legs feel increasingly weak the more he walks, and he has had a fall before.  He has been taking Tylenol  as needed, he does not tolerate NSAIDs even Celebrex due to reflux issues.  Pain is currently rated an 8/10 in severity.   Previous treatments: - Takes gabapentin  and as needed Tylenol , does not tolerate NSAIDs due to reflux  Past Medical History:   Past Medical History:  Diagnosis Date  . Blood clotting disorder (CMD)   . Cancer    (CMD)    squamous cell  . COPD (chronic obstructive pulmonary disease)    (CMD)   . Hypertension     Past Surgical History:   Past Surgical History:  Procedure Laterality Date  . FACIAL RECONSTRUCTION SURGERY     Procedure:  FACIAL RECONSTRUCTION SURGERY; 12/2019-Due to cancer  . SPINE SURGERY  01/02/2016   Procedure: SPINE SURGERY    Social History:     Allergies:  Allergies  Allergen Reactions  . Lisinopril Anaphylaxis  . Penicillins Anaphylaxis    Current Medications: @HOMEMEDS @  PMH, PSH, Medications, Allergies, SH, FH were reviewed by me today. Patient has been instructed to followup with PCP or appropriate specialist as required for symptoms outside the purview of this speciality.  PHYSICAL EXAMINATION Vitals:   04/15/24 0916  BP: 148/73  Pulse: (!) 111  Temp: 98.4 F (36.9 C)  TempSrc: Temporal  Weight: 68 kg (150 lb)  Height: 1.727 m (5' 8)     GENERAL: A/O x3, cooperative, NAD PSYCH: Mood and affect appropriate. SKIN: No rashes or lesions noted.  PULM: Respirations regular, non-labored. C/V: Bilateral lower extremity swelling MSK: Lumbar: Very limited range of motion, tenderness of bilateral lower lumbar paraspinals NEURO: Generalized weakness in lower extremities, 4+/5, No LE hyper-reflexia or clonus. No focal sensory deficits to light touch.   Radiology:  N/A  Impression:  1.  Acute on chronic bilateral low back pain with bilateral sciatica, symptoms consistent with neurogenic claudication 2.  History significant for squamous cell carcinoma status post chemoradiation therapy, skin flap to face harvested from right thigh 3.  Prior ACDF of C4-5, C5-6, C6-7  Patient with acute on chronic bilateral low  back pain with lower extremity symptoms including pain as well as worsening leg fatigue with walking consistent with neurogenic claudication.  He has not had previous workup on his low back before, we will start with some x-rays.  Patient states pain comes and goes usually self resolves however this last 1 was last 3 weeks, will prescribe a Medrol  Dosepak to see if it helps calm down acute inflammation associated with the pain.  Patient would also benefit from physical therapy  referral for working on his low back as well as general debility.  I did recommend patient potentially utilize a walker instead of just a cane as he appears unstable and a walker would allow him to lean forward which helps with his leg symptoms.  Plan:  -Referral for physical therapy given - Prescription for Medrol  Dosepak given - Lumbar x-rays ordered - Patient will follow-up in 6 to 8 weeks, consider lumbar MRI at that time if symptoms have not improved   Portions of this note may be dictated using Dragon Naturally Speaking voice recognition software. Variances in spelling and vocabulary are possible and unintentional. Not all errors are caught/corrected. Please notify the dino if any discrepancies are noted or if the meaning of any statement is not clear.   Electronically signed by:  Fairy Toribio Mody, MD 04/15/2024 9:14 AM Interventional Physiatry

## 2024-05-26 DIAGNOSIS — R0602 Shortness of breath: Secondary | ICD-10-CM | POA: Diagnosis not present

## 2024-06-11 ENCOUNTER — Encounter: Payer: Self-pay | Admitting: Internal Medicine

## 2024-06-13 DIAGNOSIS — R7989 Other specified abnormal findings of blood chemistry: Secondary | ICD-10-CM | POA: Diagnosis not present

## 2024-06-13 DIAGNOSIS — D649 Anemia, unspecified: Secondary | ICD-10-CM | POA: Diagnosis not present

## 2024-06-26 ENCOUNTER — Inpatient Hospital Stay (HOSPITAL_COMMUNITY)
Admission: EM | Admit: 2024-06-26 | Discharge: 2024-07-23 | DRG: 871 | Disposition: E | Source: Skilled Nursing Facility | Attending: Internal Medicine | Admitting: Internal Medicine

## 2024-06-26 ENCOUNTER — Emergency Department (HOSPITAL_COMMUNITY)

## 2024-06-26 ENCOUNTER — Other Ambulatory Visit (HOSPITAL_COMMUNITY)

## 2024-06-26 ENCOUNTER — Other Ambulatory Visit: Payer: Self-pay

## 2024-06-26 ENCOUNTER — Inpatient Hospital Stay (HOSPITAL_COMMUNITY)

## 2024-06-26 ENCOUNTER — Encounter (HOSPITAL_COMMUNITY): Payer: Self-pay

## 2024-06-26 DIAGNOSIS — Z515 Encounter for palliative care: Secondary | ICD-10-CM | POA: Diagnosis not present

## 2024-06-26 DIAGNOSIS — Z9221 Personal history of antineoplastic chemotherapy: Secondary | ICD-10-CM

## 2024-06-26 DIAGNOSIS — R29710 NIHSS score 10: Secondary | ICD-10-CM | POA: Diagnosis not present

## 2024-06-26 DIAGNOSIS — A419 Sepsis, unspecified organism: Principal | ICD-10-CM | POA: Diagnosis present

## 2024-06-26 DIAGNOSIS — E871 Hypo-osmolality and hyponatremia: Secondary | ICD-10-CM | POA: Diagnosis present

## 2024-06-26 DIAGNOSIS — R578 Other shock: Secondary | ICD-10-CM

## 2024-06-26 DIAGNOSIS — J9601 Acute respiratory failure with hypoxia: Secondary | ICD-10-CM | POA: Diagnosis not present

## 2024-06-26 DIAGNOSIS — I480 Paroxysmal atrial fibrillation: Secondary | ICD-10-CM | POA: Diagnosis present

## 2024-06-26 DIAGNOSIS — G9341 Metabolic encephalopathy: Secondary | ICD-10-CM | POA: Diagnosis present

## 2024-06-26 DIAGNOSIS — R918 Other nonspecific abnormal finding of lung field: Secondary | ICD-10-CM

## 2024-06-26 DIAGNOSIS — J189 Pneumonia, unspecified organism: Secondary | ICD-10-CM | POA: Diagnosis present

## 2024-06-26 DIAGNOSIS — R4182 Altered mental status, unspecified: Secondary | ICD-10-CM | POA: Diagnosis present

## 2024-06-26 DIAGNOSIS — J90 Pleural effusion, not elsewhere classified: Secondary | ICD-10-CM | POA: Diagnosis present

## 2024-06-26 DIAGNOSIS — C4492 Squamous cell carcinoma of skin, unspecified: Secondary | ICD-10-CM | POA: Diagnosis present

## 2024-06-26 DIAGNOSIS — R197 Diarrhea, unspecified: Secondary | ICD-10-CM | POA: Diagnosis present

## 2024-06-26 DIAGNOSIS — J690 Pneumonitis due to inhalation of food and vomit: Secondary | ICD-10-CM

## 2024-06-26 DIAGNOSIS — I639 Cerebral infarction, unspecified: Secondary | ICD-10-CM

## 2024-06-26 DIAGNOSIS — C44329 Squamous cell carcinoma of skin of other parts of face: Secondary | ICD-10-CM | POA: Diagnosis present

## 2024-06-26 DIAGNOSIS — D638 Anemia in other chronic diseases classified elsewhere: Secondary | ICD-10-CM | POA: Diagnosis present

## 2024-06-26 DIAGNOSIS — J9621 Acute and chronic respiratory failure with hypoxia: Secondary | ICD-10-CM | POA: Diagnosis present

## 2024-06-26 DIAGNOSIS — E873 Alkalosis: Secondary | ICD-10-CM | POA: Diagnosis present

## 2024-06-26 DIAGNOSIS — R739 Hyperglycemia, unspecified: Secondary | ICD-10-CM | POA: Diagnosis not present

## 2024-06-26 DIAGNOSIS — M19042 Primary osteoarthritis, left hand: Secondary | ICD-10-CM | POA: Diagnosis not present

## 2024-06-26 DIAGNOSIS — R471 Dysarthria and anarthria: Secondary | ICD-10-CM | POA: Diagnosis present

## 2024-06-26 DIAGNOSIS — D689 Coagulation defect, unspecified: Secondary | ICD-10-CM | POA: Diagnosis present

## 2024-06-26 DIAGNOSIS — E860 Dehydration: Secondary | ICD-10-CM | POA: Diagnosis present

## 2024-06-26 DIAGNOSIS — J440 Chronic obstructive pulmonary disease with acute lower respiratory infection: Secondary | ICD-10-CM | POA: Diagnosis present

## 2024-06-26 DIAGNOSIS — Z87891 Personal history of nicotine dependence: Secondary | ICD-10-CM

## 2024-06-26 DIAGNOSIS — R571 Hypovolemic shock: Secondary | ICD-10-CM | POA: Diagnosis present

## 2024-06-26 DIAGNOSIS — M19041 Primary osteoarthritis, right hand: Secondary | ICD-10-CM | POA: Diagnosis present

## 2024-06-26 DIAGNOSIS — Z66 Do not resuscitate: Secondary | ICD-10-CM | POA: Diagnosis present

## 2024-06-26 DIAGNOSIS — R4701 Aphasia: Secondary | ICD-10-CM | POA: Diagnosis present

## 2024-06-26 DIAGNOSIS — R54 Age-related physical debility: Secondary | ICD-10-CM | POA: Diagnosis present

## 2024-06-26 DIAGNOSIS — G928 Other toxic encephalopathy: Secondary | ICD-10-CM

## 2024-06-26 DIAGNOSIS — Z8589 Personal history of malignant neoplasm of other organs and systems: Secondary | ICD-10-CM

## 2024-06-26 DIAGNOSIS — Z8701 Personal history of pneumonia (recurrent): Secondary | ICD-10-CM

## 2024-06-26 DIAGNOSIS — R6521 Severe sepsis with septic shock: Secondary | ICD-10-CM | POA: Diagnosis present

## 2024-06-26 DIAGNOSIS — Z79899 Other long term (current) drug therapy: Secondary | ICD-10-CM

## 2024-06-26 DIAGNOSIS — N170 Acute kidney failure with tubular necrosis: Secondary | ICD-10-CM | POA: Diagnosis present

## 2024-06-26 DIAGNOSIS — N179 Acute kidney failure, unspecified: Secondary | ICD-10-CM | POA: Diagnosis not present

## 2024-06-26 DIAGNOSIS — R414 Neurologic neglect syndrome: Secondary | ICD-10-CM | POA: Diagnosis present

## 2024-06-26 DIAGNOSIS — I10 Essential (primary) hypertension: Secondary | ICD-10-CM | POA: Diagnosis present

## 2024-06-26 DIAGNOSIS — R29810 Facial weakness: Secondary | ICD-10-CM | POA: Diagnosis present

## 2024-06-26 DIAGNOSIS — E876 Hypokalemia: Secondary | ICD-10-CM | POA: Diagnosis present

## 2024-06-26 DIAGNOSIS — M6281 Muscle weakness (generalized): Secondary | ICD-10-CM | POA: Diagnosis present

## 2024-06-26 DIAGNOSIS — Z880 Allergy status to penicillin: Secondary | ICD-10-CM

## 2024-06-26 DIAGNOSIS — Z881 Allergy status to other antibiotic agents status: Secondary | ICD-10-CM

## 2024-06-26 DIAGNOSIS — D72829 Elevated white blood cell count, unspecified: Secondary | ICD-10-CM | POA: Diagnosis not present

## 2024-06-26 DIAGNOSIS — Z85828 Personal history of other malignant neoplasm of skin: Secondary | ICD-10-CM

## 2024-06-26 DIAGNOSIS — Z888 Allergy status to other drugs, medicaments and biological substances status: Secondary | ICD-10-CM

## 2024-06-26 DIAGNOSIS — I959 Hypotension, unspecified: Secondary | ICD-10-CM | POA: Diagnosis not present

## 2024-06-26 LAB — URINALYSIS, ROUTINE W REFLEX MICROSCOPIC
Bilirubin Urine: NEGATIVE
Glucose, UA: NEGATIVE mg/dL
Ketones, ur: NEGATIVE mg/dL
Leukocytes,Ua: NEGATIVE
Nitrite: NEGATIVE
Protein, ur: NEGATIVE mg/dL
Specific Gravity, Urine: 1.031 — ABNORMAL HIGH (ref 1.005–1.030)
pH: 5 (ref 5.0–8.0)

## 2024-06-26 LAB — CBC
HCT: 28.9 % — ABNORMAL LOW (ref 39.0–52.0)
HCT: 22.9 % — ABNORMAL LOW (ref 39.0–52.0)
Hemoglobin: 9.1 g/dL — ABNORMAL LOW (ref 13.0–17.0)
Hemoglobin: 7.2 g/dL — ABNORMAL LOW (ref 13.0–17.0)
MCH: 29.2 pg (ref 26.0–34.0)
MCH: 28.9 pg (ref 26.0–34.0)
MCHC: 31.5 g/dL (ref 30.0–36.0)
MCHC: 31.4 g/dL (ref 30.0–36.0)
MCV: 92.6 fL (ref 80.0–100.0)
MCV: 92 fL (ref 80.0–100.0)
Platelets: 272 K/uL (ref 150–400)
Platelets: 230 K/uL (ref 150–400)
RBC: 3.12 MIL/uL — ABNORMAL LOW (ref 4.22–5.81)
RBC: 2.49 MIL/uL — ABNORMAL LOW (ref 4.22–5.81)
RDW: 15.7 % — ABNORMAL HIGH (ref 11.5–15.5)
RDW: 15.9 % — ABNORMAL HIGH (ref 11.5–15.5)
WBC: 34.9 K/uL — ABNORMAL HIGH (ref 4.0–10.5)
WBC: 32 K/uL — ABNORMAL HIGH (ref 4.0–10.5)
nRBC: 0 % (ref 0.0–0.2)
nRBC: 0 % (ref 0.0–0.2)

## 2024-06-26 LAB — COMPREHENSIVE METABOLIC PANEL WITH GFR
ALT: 18 U/L (ref 0–44)
AST: 24 U/L (ref 15–41)
Albumin: 1.9 g/dL — ABNORMAL LOW (ref 3.5–5.0)
Alkaline Phosphatase: 67 U/L (ref 38–126)
Anion gap: 12 (ref 5–15)
BUN: 39 mg/dL — ABNORMAL HIGH (ref 8–23)
CO2: 25 mmol/L (ref 22–32)
Calcium: 8 mg/dL — ABNORMAL LOW (ref 8.9–10.3)
Chloride: 100 mmol/L (ref 98–111)
Creatinine, Ser: 2.15 mg/dL — ABNORMAL HIGH (ref 0.61–1.24)
GFR, Estimated: 31 mL/min — ABNORMAL LOW (ref >60)
Glucose, Bld: 76 mg/dL (ref 70–99)
Potassium: 4.2 mmol/L (ref 3.5–5.1)
Sodium: 137 mmol/L (ref 135–145)
Total Bilirubin: 0.9 mg/dL (ref 0.0–1.2)
Total Protein: 5.6 g/dL — ABNORMAL LOW (ref 6.5–8.1)

## 2024-06-26 LAB — GLUCOSE, CAPILLARY
Glucose-Capillary: 86 mg/dL (ref 70–99)
Glucose-Capillary: 130 mg/dL — ABNORMAL HIGH (ref 70–99)
Glucose-Capillary: 134 mg/dL — ABNORMAL HIGH (ref 70–99)

## 2024-06-26 LAB — URINALYSIS, W/ REFLEX TO CULTURE (INFECTION SUSPECTED)
Bilirubin Urine: NEGATIVE
Glucose, UA: NEGATIVE mg/dL
Hgb urine dipstick: NEGATIVE
Ketones, ur: NEGATIVE mg/dL
Leukocytes,Ua: NEGATIVE
Nitrite: NEGATIVE
Protein, ur: NEGATIVE mg/dL
Specific Gravity, Urine: 1.019 (ref 1.005–1.030)
pH: 5 (ref 5.0–8.0)

## 2024-06-26 LAB — ECHOCARDIOGRAM COMPLETE
AR max vel: 1.18 cm2
AV Area VTI: 1.28 cm2
AV Area mean vel: 1.25 cm2
AV Mean grad: 29 mmHg
AV Peak grad: 53.7 mmHg
Ao pk vel: 3.67 m/s
Area-P 1/2: 3.61 cm2
Calc EF: 65.8 %
Height: 67 in
S' Lateral: 3.9 cm
Single Plane A2C EF: 61.7 %
Single Plane A4C EF: 65.8 %
Weight: 2836 [oz_av]

## 2024-06-26 LAB — RESPIRATORY PANEL BY PCR

## 2024-06-26 LAB — BASIC METABOLIC PANEL WITH GFR
Anion gap: 10 (ref 5–15)
BUN: 35 mg/dL — ABNORMAL HIGH (ref 8–23)
CO2: 22 mmol/L (ref 22–32)
Calcium: 7.4 mg/dL — ABNORMAL LOW (ref 8.9–10.3)
Chloride: 102 mmol/L (ref 98–111)
Creatinine, Ser: 1.45 mg/dL — ABNORMAL HIGH (ref 0.61–1.24)
GFR, Estimated: 50 mL/min — ABNORMAL LOW (ref >60)
Glucose, Bld: 105 mg/dL — ABNORMAL HIGH (ref 70–99)
Potassium: 4.1 mmol/L (ref 3.5–5.1)
Sodium: 134 mmol/L — ABNORMAL LOW (ref 135–145)

## 2024-06-26 LAB — I-STAT CHEM 8, ED
BUN: 43 mg/dL — ABNORMAL HIGH (ref 8–23)
Calcium, Ion: 1.05 mmol/L — ABNORMAL LOW (ref 1.15–1.40)
Chloride: 100 mmol/L (ref 98–111)
Creatinine, Ser: 2.2 mg/dL — ABNORMAL HIGH (ref 0.61–1.24)
Glucose, Bld: 68 mg/dL — ABNORMAL LOW (ref 70–99)
HCT: 28 % — ABNORMAL LOW (ref 39.0–52.0)
Hemoglobin: 9.5 g/dL — ABNORMAL LOW (ref 13.0–17.0)
Potassium: 4.4 mmol/L (ref 3.5–5.1)
Sodium: 135 mmol/L (ref 135–145)
TCO2: 27 mmol/L (ref 22–32)

## 2024-06-26 LAB — MRSA NEXT GEN BY PCR, NASAL: MRSA by PCR Next Gen: NOT DETECTED

## 2024-06-26 LAB — STREP PNEUMONIAE URINARY ANTIGEN: Strep Pneumo Urinary Antigen: NEGATIVE

## 2024-06-26 LAB — APTT: aPTT: 36 s (ref 24–36)

## 2024-06-26 LAB — ETHANOL: Alcohol, Ethyl (B): <15 mg/dL (ref <15)

## 2024-06-26 LAB — CBG MONITORING, ED
Glucose-Capillary: 66 mg/dL — ABNORMAL LOW (ref 70–99)
Glucose-Capillary: 77 mg/dL (ref 70–99)

## 2024-06-26 LAB — DIFFERENTIAL
Abs Immature Granulocytes: 0.53 K/uL — ABNORMAL HIGH (ref 0.00–0.07)
Basophils Absolute: 0.1 K/uL (ref 0.0–0.1)
Basophils Relative: 0 %
Eosinophils Absolute: 0 K/uL (ref 0.0–0.5)
Eosinophils Relative: 0 %
Immature Granulocytes: 2 %
Lymphocytes Relative: 2 %
Lymphs Abs: 0.8 K/uL (ref 0.7–4.0)
Monocytes Absolute: 2 K/uL — ABNORMAL HIGH (ref 0.1–1.0)
Monocytes Relative: 6 %
Neutro Abs: 31.6 K/uL — ABNORMAL HIGH (ref 1.7–7.7)
Neutrophils Relative %: 90 %
Smear Review: NORMAL
WBC Morphology: INCREASED

## 2024-06-26 LAB — CREATININE, SERUM
Creatinine, Ser: 1.85 mg/dL — ABNORMAL HIGH (ref 0.61–1.24)
GFR, Estimated: 37 mL/min — ABNORMAL LOW (ref >60)

## 2024-06-26 LAB — HEMOGLOBIN A1C
Hgb A1c MFr Bld: 5.3 % (ref 4.8–5.6)
Mean Plasma Glucose: 105.41 mg/dL

## 2024-06-26 LAB — PROTIME-INR
INR: 1.3 — ABNORMAL HIGH (ref 0.8–1.2)
Prothrombin Time: 16.8 s — ABNORMAL HIGH (ref 11.4–15.2)

## 2024-06-26 LAB — I-STAT CG4 LACTIC ACID, ED: Lactic Acid, Venous: 1.5 mmol/L (ref 0.5–1.9)

## 2024-06-26 LAB — BRAIN NATRIURETIC PEPTIDE: B Natriuretic Peptide: 295 pg/mL — ABNORMAL HIGH (ref 0.0–100.0)

## 2024-06-26 MED ORDER — DOCUSATE SODIUM 100 MG PO CAPS: 100.0000 mg | ORAL_CAPSULE | Freq: Two times a day (BID) | ORAL | Status: DC | PRN

## 2024-06-26 MED ORDER — INSULIN ASPART 100 UNIT/ML IJ SOLN
0.0000 [IU] | INTRAMUSCULAR | Status: DC
  Administered 2024-06-26 – 2024-06-27 (×3): 1 [IU] via SUBCUTANEOUS
  Administered 2024-06-27: 2 [IU] via SUBCUTANEOUS
  Administered 2024-06-27 – 2024-07-01 (×5): 1 [IU] via SUBCUTANEOUS

## 2024-06-26 MED ORDER — LACTATED RINGERS IV BOLUS
1000.0000 mL | Freq: Once | INTRAVENOUS | Status: AC
  Administered 2024-06-26: 1000 mL via INTRAVENOUS

## 2024-06-26 MED ORDER — SODIUM CHLORIDE 0.9 % IV SOLN: 250.0000 mL | INTRAVENOUS | Status: AC

## 2024-06-26 MED ORDER — ORAL CARE MOUTH RINSE
15.0000 mL | OROMUCOSAL | Status: DC | PRN
Start: 2024-06-26 — End: 2024-07-06
  Administered 2024-06-27: 15 mL via OROMUCOSAL

## 2024-06-26 MED ORDER — OXYCODONE HCL 5 MG PO TABS
5.0000 mg | ORAL_TABLET | Freq: Four times a day (QID) | ORAL | Status: DC | PRN
  Administered 2024-06-26 – 2024-06-30 (×8): 5 mg via ORAL
  Filled 2024-06-26 (×8): qty 1

## 2024-06-26 MED ORDER — NOREPINEPHRINE 4 MG/250ML-% IV SOLN
0.0000 ug/min | INTRAVENOUS | Status: DC
  Administered 2024-06-26: 23 ug/min via INTRAVENOUS
  Administered 2024-06-26: 2 ug/min via INTRAVENOUS
  Administered 2024-06-26: 25 ug/min via INTRAVENOUS
  Filled 2024-06-26 (×4): qty 250

## 2024-06-26 MED ORDER — METRONIDAZOLE 500 MG/100ML IV SOLN
500.0000 mg | Freq: Once | INTRAVENOUS | Status: AC
  Administered 2024-06-26: 500 mg via INTRAVENOUS
  Filled 2024-06-26: qty 100

## 2024-06-26 MED ORDER — LACTATED RINGERS IV BOLUS (SEPSIS)
1000.0000 mL | Freq: Once | INTRAVENOUS | Status: AC
  Administered 2024-06-26: 1000 mL via INTRAVENOUS

## 2024-06-26 MED ORDER — SODIUM CHLORIDE 0.9% FLUSH: 10.0000 mL | INTRAVENOUS | Status: DC | PRN

## 2024-06-26 MED ORDER — VANCOMYCIN HCL 1500 MG/300ML IV SOLN
1500.0000 mg | Freq: Once | INTRAVENOUS | Status: AC
  Administered 2024-06-26: 1500 mg via INTRAVENOUS
  Filled 2024-06-26: qty 300

## 2024-06-26 MED ORDER — CHLORHEXIDINE GLUCONATE CLOTH 2 % EX PADS
6.0000 | MEDICATED_PAD | Freq: Every day | CUTANEOUS | Status: DC
  Administered 2024-06-27 – 2024-07-01 (×5): 6 via TOPICAL

## 2024-06-26 MED ORDER — GERHARDT'S BUTT CREAM
TOPICAL_CREAM | Freq: Two times a day (BID) | CUTANEOUS | Status: DC
  Filled 2024-06-26 (×2): qty 60

## 2024-06-26 MED ORDER — POLYETHYLENE GLYCOL 3350 17 G PO PACK: 17.0000 g | PACK | Freq: Every day | ORAL | Status: DC | PRN

## 2024-06-26 MED ORDER — SODIUM CHLORIDE 0.9 % IV SOLN: 2.0000 g | INTRAVENOUS | Status: DC

## 2024-06-26 MED ORDER — GABAPENTIN 100 MG PO CAPS
100.0000 mg | ORAL_CAPSULE | Freq: Two times a day (BID) | ORAL | Status: DC
  Administered 2024-06-26 – 2024-06-27 (×3): 100 mg via ORAL
  Filled 2024-06-26 (×3): qty 1

## 2024-06-26 MED ORDER — HEPARIN SODIUM (PORCINE) 5000 UNIT/ML IJ SOLN
5000.0000 [IU] | Freq: Three times a day (TID) | INTRAMUSCULAR | Status: DC
  Administered 2024-06-26 – 2024-07-05 (×27): 5000 [IU] via SUBCUTANEOUS
  Filled 2024-06-26 (×26): qty 1

## 2024-06-26 MED ORDER — SODIUM CHLORIDE 0.9% FLUSH
10.0000 mL | Freq: Two times a day (BID) | INTRAVENOUS | Status: DC
  Administered 2024-06-26: 10 mL
  Administered 2024-06-27: 20 mL
  Administered 2024-06-27 – 2024-07-04 (×13): 10 mL

## 2024-06-26 MED ORDER — LINEZOLID 600 MG/300ML IV SOLN
600.0000 mg | Freq: Two times a day (BID) | INTRAVENOUS | Status: AC
  Administered 2024-06-26 – 2024-06-28 (×5): 600 mg via INTRAVENOUS
  Filled 2024-06-26 (×6): qty 300

## 2024-06-26 MED ORDER — LINEZOLID 600 MG/300ML IV SOLN: 600.0000 mg | Freq: Two times a day (BID) | INTRAVENOUS | Status: DC

## 2024-06-26 MED ORDER — NICOTINE 14 MG/24HR TD PT24
14.0000 mg | MEDICATED_PATCH | Freq: Every day | TRANSDERMAL | Status: DC
  Administered 2024-06-26 – 2024-07-04 (×8): 14 mg via TRANSDERMAL
  Filled 2024-06-26 (×8): qty 1

## 2024-06-26 MED ORDER — IOHEXOL 350 MG/ML SOLN
100.0000 mL | Freq: Once | INTRAVENOUS | Status: AC | PRN
  Administered 2024-06-26: 100 mL via INTRAVENOUS

## 2024-06-26 MED ORDER — ONDANSETRON HCL 4 MG/2ML IJ SOLN
4.0000 mg | Freq: Four times a day (QID) | INTRAMUSCULAR | Status: DC | PRN
  Administered 2024-06-28: 4 mg via INTRAVENOUS
  Filled 2024-06-26: qty 2

## 2024-06-26 MED ORDER — SODIUM CHLORIDE 0.9 % IV SOLN
2.0000 g | Freq: Once | INTRAVENOUS | Status: AC
  Administered 2024-06-26: 2 g via INTRAVENOUS
  Filled 2024-06-26: qty 12.5

## 2024-06-26 MED ORDER — ACETAMINOPHEN 325 MG PO TABS
650.0000 mg | ORAL_TABLET | ORAL | Status: DC | PRN
  Administered 2024-06-27 – 2024-07-01 (×2): 650 mg via ORAL
  Filled 2024-06-26 (×3): qty 2

## 2024-06-26 MED ORDER — SODIUM CHLORIDE 0.9% FLUSH: 3.0000 mL | Freq: Once | INTRAVENOUS | Status: DC

## 2024-06-26 MED ORDER — LACTATED RINGERS IV SOLN: INTRAVENOUS | Status: AC

## 2024-06-26 NOTE — Procedures (Signed)
 Arterial Catheter Insertion Procedure Note  Phillip Logan  [id number]  December 10, 1946  Date:06/26/24  Time:6:02 PM    Provider Performing: Benton D. Harris    Procedure: Insertion of Arterial Line (63379) with US  guidance (23062)   Indication(s) Blood pressure monitoring and/or need for frequent ABGs  Consent Unable to obtain consent due to emergent nature of procedure.  Anesthesia None   Time Out Verified patient identification, verified procedure, site/side was marked, verified correct patient position, special equipment/implants available, medications/allergies/relevant history reviewed, required imaging and test results available.   Sterile Technique Maximal sterile technique including full sterile barrier drape, hand hygiene, sterile gown, sterile gloves, mask, hair covering, sterile ultrasound probe cover (if used).   Procedure Description Area of catheter insertion was cleaned with chlorhexidine  and draped in sterile fashion. With real-time ultrasound guidance an arterial catheter was placed into the right femoral artery.  Appropriate arterial tracings confirmed on monitor.     Complications/Tolerance None; patient tolerated the procedure well.   EBL Minimal   Specimen(s) None  Phillip Logan D. Harris, NP-C [facility] Personal contact information can be found on Amion  If no contact or response made please call [phone] 06/26/2024, 6:02 PM

## 2024-06-26 NOTE — Progress Notes (Signed)
 Ongoing hypotension.  Additional fluids.  Still hypotensive.  Increase norepinephrine  peripherally to 20 mcg temporarily while we plan for insertion of central line as soon as able.

## 2024-06-26 NOTE — ED Provider Notes (Signed)
 [place] EMERGENCY DEPARTMENT AT [facility] Provider Note   CSN: [id number] Arrival date & time: 06/26/24  1030  An emergency department physician performed an initial assessment on this suspected stroke patient at 1031.  Patient presents with: Code Stroke   Phillip Logan is a 78 y.o. male with hx of squamous cell carcinoma, COPD, HTN, Clotting disordered, cervical fusion, presenting to ED from rehab with hypoxia, AMS, garbled speech.  Pt reporting onset of symptoms at 10 AM today, last seen normal last night by staff.  Arrives as code stroke.  EMS reports hypoxia to 70% on room air requiring NRB mask, hypotension to 64/44 mmhg, pt given 500 cc fluid prior to arrival.  Patient is not on A/C.   HPI     Prior to Admission medications   Medication Sig Start Date End Date Taking? Authorizing Provider  albuterol  (VENTOLIN  HFA) 108 (90 Base) MCG/ACT inhaler Inhale into the lungs every 6 (six) hours as needed for wheezing or shortness of breath.    [provider]  b complex vitamins capsule Take 1 capsule by mouth daily.    [provider]  diphenhydrAMINE (BENADRYL) 25 mg capsule Take 25 mg by mouth every 6 (six) hours as needed.    [provider]  famotidine (PEPCID) 20 MG tablet Take 20 mg by mouth 2 (two) times daily.    [provider]  furosemide (LASIX) 20 MG tablet Take 20 mg by mouth.    [provider]  gabapentin  (NEURONTIN ) 300 MG capsule Take 300 mg by mouth 2 (two) times daily.    [provider]  lidocaine (XYLOCAINE) 2 % solution Use as directed 15 mLs in the mouth or throat as needed for mouth pain.    [provider]  losartan (COZAAR) 100 MG tablet Take 100 mg by mouth daily. 03/20/24   [provider]  Multiple Vitamins-Minerals (MULTIVITAMIN WITH MINERALS) tablet Take 1 tablet by mouth daily.    [provider]  potassium chloride  SA (KLOR-CON ) 20 MEQ tablet Take 20  mEq by mouth once.    [provider]  tamsulosin (FLOMAX) 0.4 MG CAPS capsule Take 0.4 mg by mouth.    [provider]    Allergies: Ciprofloxacin, Clindamycin/lincomycin, Clonidine derivatives, Cyproheptadine, Lisinopril, Myrbetriq [mirabegron], Penicillins, and Streptokinases    Review of Systems  Updated Vital Signs BP (!) 86/43   Pulse (!) 108   Temp 98.3 F (36.8 C) (Oral)   Resp 15   Ht 5' 7 (1.702 m)   Wt 80.4 kg   SpO2 95%   BMI 27.76 kg/m   Physical Exam Constitutional:      General: He is not in acute distress.    Comments: Garbled speech  HENT:     Head: Normocephalic and atraumatic.  Eyes:     Conjunctiva/sclera: Conjunctivae normal.     Pupils: Pupils are equal, round, and reactive to light.  Cardiovascular:     Rate and Rhythm: Normal rate and regular rhythm.  Pulmonary:     Effort: Pulmonary effort is normal. No respiratory distress.     Comments: Rhonchours breath sounds bilaterally Abdominal:     General: There is no distension.     Tenderness: There is no abdominal tenderness.  Skin:    General: Skin is warm and dry.  Neurological:     Mental Status: He is alert.     Comments: Garbled speech, confusion, left sided pronator drift, noncompliant with motor exam, reports sensation  intact in all extremities  Psychiatric:        Mood and Affect: Mood normal.        Behavior: Behavior normal.     (all labs ordered are listed, but only abnormal results are displayed) Labs Reviewed  PROTIME-INR - Abnormal; Notable for the following components:      Result Value   Prothrombin Time 16.8 (*)    INR 1.3 (*)    All other components within normal limits  CBC - Abnormal; Notable for the following components:   WBC 34.9 (*)    RBC 3.12 (*)    Hemoglobin 9.1 (*)    HCT 28.9 (*)    RDW 15.7 (*)    All other components within normal limits  DIFFERENTIAL - Abnormal; Notable for the following components:   Neutro Abs 31.6 (*)     Monocytes Absolute 2.0 (*)    Abs Immature Granulocytes 0.53 (*)    All other components within normal limits  COMPREHENSIVE METABOLIC PANEL WITH GFR - Abnormal; Notable for the following components:   BUN 39 (*)    Creatinine, Ser 2.15 (*)    Calcium 8.0 (*)    Total Protein 5.6 (*)    Albumin  1.9 (*)    GFR, Estimated 31 (*)    All other components within normal limits  URINALYSIS, W/ REFLEX TO CULTURE (INFECTION SUSPECTED) - Abnormal; Notable for the following components:   Color, Urine AMBER (*)    APPearance CLOUDY (*)    Bacteria, UA RARE (*)    All other components within normal limits  BRAIN NATRIURETIC PEPTIDE - Abnormal; Notable for the following components:   B Natriuretic Peptide 295.0 (*)    All other components within normal limits  CBC - Abnormal; Notable for the following components:   WBC 32.0 (*)    RBC 2.49 (*)    Hemoglobin 7.2 (*)    HCT 22.9 (*)    RDW 15.9 (*)    All other components within normal limits  CREATININE, SERUM - Abnormal; Notable for the following components:   Creatinine, Ser 1.85 (*)    GFR, Estimated 37 (*)    All other components within normal limits  I-STAT CHEM 8, ED - Abnormal; Notable for the following components:   BUN 43 (*)    Creatinine, Ser 2.20 (*)    Glucose, Bld 68 (*)    Calcium, Ion 1.05 (*)    Hemoglobin 9.5 (*)    HCT 28.0 (*)    All other components within normal limits  CBG MONITORING, ED - Abnormal; Notable for the following components:   Glucose-Capillary 66 (*)    All other components within normal limits  RESPIRATORY PANEL BY PCR  CULTURE, BLOOD (ROUTINE X 2)  CULTURE, BLOOD (ROUTINE X 2)  MRSA NEXT GEN BY PCR, NASAL  APTT  ETHANOL  HEMOGLOBIN A1C  BASIC METABOLIC PANEL WITH GFR  LEGIONELLA PNEUMOPHILA SEROGP 1 UR AG  STREP PNEUMONIAE URINARY ANTIGEN  URINALYSIS, ROUTINE W REFLEX MICROSCOPIC  I-STAT CG4 LACTIC ACID, ED  CBG MONITORING, ED  I-STAT CG4 LACTIC ACID, ED    EKG: EKG  Interpretation Date/Time:  Saturday June 26 2024 10:59:35 EDT Ventricular Rate:  146 PR Interval:    QRS Duration:  114 QT Interval:  269 QTC Calculation: 424 R Axis:   248  Text Interpretation: Right and left arm electrode reversal, interpretation assumes no reversal Atrial fibrillation with rapid V-rate Borderline intraventricular conduction delay Borderline low voltage, extremity leads  Repolarization abnormality, prob rate related Artifact in lead(s) I II aVR aVL aVF V1 V2 V3 Confirmed by Cottie Cough [phone]) on 06/26/2024 11:21:29 AM  Radiology: [facility] Chest Port 1 View Result Date: 06/26/2024 CLINICAL DATA:  Questionable sepsis - evaluate for abnormality EXAM: PORTABLE CHEST 1 VIEW COMPARISON:  Radiograph 06/12/2024 and 05/31/2024.  CT 05/25/2024. FINDINGS: 1111 hours. The heart size and mediastinal contours are stable. There are progressive basilar predominant airspace opacities in both lungs with poor definition of the pulmonary vasculature. No pneumothorax or significant pleural effusion. The bones appear unchanged status post multilevel cervical fusion. Telemetry leads overlie the chest. IMPRESSION: Progressive basilar predominant airspace opacities in both lungs, suspicious for multifocal pneumonia. Electronically Signed   By: Elsie Perone M.D.   On: 06/26/2024 12:10   CT ANGIO HEAD NECK W WO CM W PERF (CODE STROKE) Result Date: 06/26/2024 CLINICAL DATA:  78 year old male code stroke presentation. Abnormal speech, right facial droop. EXAM: CT ANGIOGRAPHY HEAD AND NECK CT PERFUSION BRAIN TECHNIQUE: Multidetector CT imaging of the head and neck was performed using the standard protocol during bolus administration of intravenous contrast. Multiplanar CT image reconstructions and MIPs were obtained to evaluate the vascular anatomy. Carotid stenosis measurements (when applicable) are obtained utilizing NASCET criteria, using the distal internal carotid diameter as the denominator. Multiphase CT  imaging of the brain was performed following IV bolus contrast injection. Subsequent parametric perfusion maps were calculated using RAPID software. RADIATION DOSE REDUCTION: This exam was performed according to the departmental dose-optimization program which includes automated exposure control, adjustment of the mA and/or kV according to patient size and/or use of iterative reconstruction technique. CONTRAST:  Not specified. COMPARISON:  Head CT 1043 hours today. FINDINGS: CT Brain Perfusion Findings: ASPECTS: 10 CBF (<30%) Volume: 0mL Perfusion (Tmax>6.0s) volume: 0mL Mismatch Volume: Not applicable Infarction Location:Not applicable CTA NECK Skeleton: Cervical ACDF C4 through C7 with evidence of solid arthrodesis. Developing degenerative interbody ankylosis at C3-C4. Carious dentition. Redemonstrated left vertex skull outer table erosion series 5, image 6. No other acute or suspicious osseous lesion identified. Upper chest: Multifocal upper lung peribronchial spiculated and ground-glass opacity greater on the right (series 5, image 163) up to 3 cm diameter, nonspecific. Small layering left pleural effusion. No superior mediastinal lymphadenopathy identified. Other neck: Previous right neck dissection. Surgically absent submandibular and parotid glands. Numerous right neck surgical clips. Right IJ remains in place. Right sternocleidomastoid muscle appears small but in place. No superimposed neck mass or lymphadenopathy is identified. Aortic arch: Calcified aortic atherosclerosis.  Three vessel arch. Right carotid system: Brachiocephalic artery plaque with up to 50 % stenosis with respect to the distal vessel. Right CCA origin relatively spared. Soft and calcified plaque through the right carotid bifurcation without significant stenosis. Bulky calcified plaque right ICA bulb with less than 50 % stenosis with respect to the distal vessel. Left carotid system: Motion artifact. Left CCA origin plaque within unclear  degree of subsequent stenosis. Left CCA remains patent. Anterior vessel soft and calcified plaque before the bifurcation without stenosis. Calcified plaque at the left ICA origin and bulb without stenosis. Tortuosity below the skull base. Vertebral arteries: Proximal right subclavian artery and right vertebral artery origin patent with only mild plaque and no significant stenosis. Non dominant appearing right vertebral artery remains patent to the skull base. Bulky left subclavian origin plaque although detail degraded by motion. Hemodynamically significant stenosis suspected on series 10, image 42. Left subclavian and left vertebral artery origin remain patent. Mild left vertebral origin calcified  plaque without stenosis. Dominant left vertebral artery patent to the skull base without stenosis. CTA HEAD Posterior circulation: Dominant left vertebral artery with mild calcified the 4 segment plaque and no stenosis to the vertebrobasilar junction. Diminutive right V4 segment but remains patent to the basilar. Bilateral AICA appear dominant and patent. Patent basilar artery without stenosis. Tortuous basilar. Patent SCA and PCA origins. Posterior communicating arteries are diminutive or absent. Moderate to severe left and moderate right P1 segment irregularity and stenosis on series 7, image 18. Additional moderate to severe left P2 segment stenosis on series 9, image 19. Moderate additional multifocal right P2 segment stenosis on series 9, image 14. Maintained bilateral distal PCA enhancement. Anterior circulation: Both ICA siphons are patent. Calcified left cavernous and supraclinoid segment plaque with only mild stenosis. Similar calcified right siphon plaque with only mild stenosis. Patent carotid termini. Patent MCA and ACA origins. Normal anterior communicating artery. Bilateral ACA branches are within normal limits. Left MCA M1 segment and trifurcation are patent without stenosis. Right MCA M1 segment and  trifurcation are patent without stenosis. Bilateral MCA branches are within normal limits. Venous sinuses: Patent. Anatomic variants: Dominant left vertebral artery. Review of the MIP images confirms the above findings IMPRESSION: 1. Negative CT Perfusion. 2. Abundant atherosclerosis at the aortic arch, throughout the head and neck. But negative for large vessel occlusion. Aortic Atherosclerosis (ICD10-I70.0). Notable stenoses: -  Moderate to Severe bilateral PCA P1 and P2. - motion artifact but probably hemodynamically significant Left Subclavian Origin stenosis. And similar plaque but unclear degree of stenosis at the Left CCA origin. 3. Nonspecific abnormal right greater than left upper lobe lung peribronchial opacity, mildly spiculated. Small layering left pleural effusion. 4. Previous Right neck dissection. New or increased left vertex outer table skull erosion from Head CT last month. But no other mass or metastatic disease identified in the Neck. Electronically Signed   By: VEAR Hurst M.D.   On: 06/26/2024 11:32   CT HEAD CODE STROKE WO CONTRAST Result Date: 06/26/2024 CLINICAL DATA:  Code stroke.  78 year old male. EXAM: CT HEAD WITHOUT CONTRAST TECHNIQUE: Contiguous axial images were obtained from the base of the skull through the vertex without intravenous contrast. RADIATION DOSE REDUCTION: This exam was performed according to the departmental dose-optimization program which includes automated exposure control, adjustment of the mA and/or kV according to patient size and/or use of iterative reconstruction technique. COMPARISON:  Head CT 05/31/2024. FINDINGS: Brain: No midline shift, ventriculomegaly, mass effect, evidence of mass lesion, intracranial hemorrhage or evidence of cortically based acute infarction. Streak artifact from the right globe. Patchy and confluent bilateral cerebral white matter hypodensity with some deep white matter capsule involvement which is fairly symmetric on both sides. No  cytotoxic edema is identified. Vascular: Calcified atherosclerosis at the skull base. No suspicious intracranial vascular hyperdensity. Skull: There is a new or increased vertex skull erosion on the left, series 4, image 87 and encompassing an area of about 2 cm. Other visible bone mineralization within normal limits. No other acute osseous abnormality identified. Sinuses/Orbits: Right ethmoid and left sphenoid sinus mucosal thickening not significantly changed. Other: Chronic metal artifact along the anterior right globe, multiple surgical clips in the visible right face, along the right temporalis muscle. ASPECTS [facility] Stroke Program Early CT Score) Total score (0-10 with 10 being normal): 10 IMPRESSION: 1. No acute cortically based infarct or intracranial hemorrhage identified. ASPECTS 10. 2. New left vertex skull erosion since 05/31/2024, suspicious for a metastatic lesion. These results were communicated  to Dr. Michaela at 10:50 am on 06/26/2024 by text page via the [facility] messaging system. Electronically Signed   By: VEAR Hurst M.D.   On: 06/26/2024 10:51     .Critical Care  Performed by: Cottie Donnice PARAS, MD Authorized by: Cottie Donnice PARAS, MD   Critical care provider statement:    Critical care time (minutes):  45   Critical care time was exclusive of:  Separately billable procedures and treating other patients   Critical care was necessary to treat or prevent imminent or life-threatening deterioration of the following conditions:  Sepsis   Critical care was time spent personally by me on the following activities:  Ordering and performing treatments and interventions, ordering and review of laboratory studies, ordering and review of radiographic studies, pulse oximetry, review of old charts, examination of patient and evaluation of patient's response to treatment   Care discussed with: admitting provider      Medications Ordered in the ED  sodium chloride  flush (NS) 0.9 % injection 3 mL (3  mLs Intravenous Not Given 06/26/24 1119)  lactated ringers  infusion ( Intravenous New Bag/Given 06/26/24 1249)  docusate sodium  (COLACE) capsule 100 mg (has no administration in time range)  polyethylene glycol (MIRALAX  / GLYCOLAX ) packet 17 g (has no administration in time range)  heparin  injection 5,000 Units (5,000 Units Subcutaneous Given 06/26/24 1419)  ondansetron  (ZOFRAN ) injection 4 mg (has no administration in time range)  acetaminophen  (TYLENOL ) tablet 650 mg (has no administration in time range)  insulin  aspart (novoLOG ) injection 0-9 Units (0 Units Subcutaneous Hold 06/26/24 1251)  0.9 %  sodium chloride  infusion (0 mLs Intravenous Hold 06/26/24 1403)  norepinephrine  (LEVOPHED ) 4mg  in (0.016 mg/mL) premix infusion (2 mcg/min Intravenous New Bag/Given 06/26/24 1428)  linezolid  (ZYVOX ) IVPB 600 mg (has no administration in time range)  ceFEPIme  (MAXIPIME ) 2 g in sodium chloride  0.9 % 100 mL IVPB (has no administration in time range)  oxyCODONE  (Oxy IR/ROXICODONE ) immediate release tablet 5 mg (5 mg Oral Given 06/26/24 1419)  gabapentin  (NEURONTIN ) capsule 100 mg (100 mg Oral Given 06/26/24 1419)  nicotine  (NICODERM CQ  - dosed in mg/24 hours) patch 14 mg (has no administration in time range)  Oral care mouth rinse (has no administration in time range)  lactated ringers  bolus 1,000 mL (0 mLs Intravenous Stopped 06/26/24 1246)  iohexol  (OMNIPAQUE ) 350 MG/ML injection 100 mL (100 mLs Intravenous Contrast Given 06/26/24 1059)  lactated ringers  bolus 1,000 mL (0 mLs Intravenous Stopped 06/26/24 1230)  vancomycin  (VANCOREADY) IVPB 1500 mg/300 mL (1,500 mg Intravenous New Bag/Given 06/26/24 1300)  ceFEPIme  (MAXIPIME ) 2 g in sodium chloride  0.9 % 100 mL IVPB (0 g Intravenous Stopped 06/26/24 1201)  metroNIDAZOLE  (FLAGYL ) IVPB 500 mg (0 mg Intravenous Stopped 06/26/24 1301)  lactated ringers  bolus 1,000 mL (0 mLs Intravenous Stopped 06/26/24 1402)    Clinical Course as of 06/26/24 1538  Sat Jun 26, 2024  1113  Manual BP both arms 70/40 mmhg - 2 liter sepsis fluid bolus ordered per IBW.  Pt awake and mentating [MT]  1113 Now on 8L [place] at 95% O2 [MT]  1132 ICU consult placed [MT]  1240 MAP improved > 65 mmhg after fluids.  ICU to admit pt [MT]    Clinical Course User Index [MT] Nafisah Runions, Donnice PARAS, MD                                 Medical Decision Making Amount  and/or Complexity of Data Reviewed Labs: ordered. Radiology: ordered.  Risk Prescription drug management. Decision regarding hospitalization.   This patient presents to the ED with concern for hypoxia, hypotension, confusion and garbled speech. This involves an extensive number of treatment options, and is a complaint that carries with it a high risk of complications and morbidity.  The differential diagnosis includes aspiration PNA vs sepsis vs stroke vs other  Given fever and SIRS criteria on arrival, code sepsis activated.  Will bolus 2 L LR as pt received 500 cc from EMS to complete fluid bolus.  Initial lactate was normal but he was quite hypotensive.  Less likely acute PE given fever - but difficult to exclude entirely.  Will obtain xray first and complete primary assessment; defer additional iodine contrast load after CT angio head, given poor Cr and GFR.  Co-morbidities that complicate the patient evaluation: COPD  Additional history obtained from EMS  I ordered and personally interpreted labs.  The pertinent results include:  lactate normal, WBC elevated  I ordered imaging studies including CT head, CTA, xray chest I independently visualized and interpreted imaging which showed concern for PNA I agree with the radiologist interpretation  The patient was maintained on a cardiac monitor.  I personally viewed and interpreted the cardiac monitored which showed an underlying rhythm of: sinus tachycardia  Per my interpretation the patient's ECG shows sinus tachycardia with significant movement artifact   I ordered medication  including BS antibiotics, IV fluid bolus for sepsis   I have reviewed the patients home medicines and have made adjustments as needed  Test Considered: lower suspicion for acute meningitis  I requested consultation with the neurology, critical care,  and discussed lab and imaging findings as well as pertinent plan - they recommend: MRI brain when stable to complete stroke evaluation; no tnk indicated or intervention at this time from stroke perspective  After the interventions noted above, I reevaluated the patient and found that they have: improved - BP improved after fluid bolus   Disposition:  After consideration of the diagnostic results and the patients response to treatment, I feel that the patent would benefit from medical admission.      Final diagnoses:  Sepsis, due to unspecified organism, unspecified whether acute organ dysfunction present [facility])    ED Discharge Orders     None          Cottie Donnice PARAS, MD 06/26/24 1538

## 2024-06-26 NOTE — Consult Note (Signed)
 NEUROLOGY CONSULT NOTE   Date of service: June 26, 2024 Patient Name: Phillip Logan MRN:  [id number] DOB:  March 01, 1946 Chief Complaint: CODE STROKE Requesting Provider: Cottie Donnice PARAS, MD  History of Present Illness  Phillip Logan is a 78 y.o. 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  Per EMS, they were called out to [facility] for breathing difficulties.  On their assessment, patient's oxygen saturation was reportedly on the 60s.  They noted slurred speech and right facial droop and activated code stroke.  En route with EMS, patient was hypotensive with blood pressure in the 80s and low 90s, was started on fluid. Code Sepsis was activated on arrival to ED.  On exam at bridge, patient is alert, oriented to self, not time.  He does follow commands. No gaze preference. Right facial droop is present, per chart review this is baseline due to previous facial reconstruction surgery secondary to squamous cell carcinoma.  Mild expressive aphasia, moderate dysarthria present.  Sensory deficit and extinction to left present. Continued hypotension with systolic's momentarily dropping into the upper 60s.  MAPs did remain above 60 throughout. CBG: 66. Patient arrived to ED on NRB.    LKW: 2200 7/4 Modified rankin score: 4 IV Thrombolysis: No outside of window EVT: No, no LVO suspected  NIHSS components Score: Comment  1a Level of Conscious 0[x]  1[]  2[]  3[]      1b LOC Questions 0[]  1[x]  2[]       1c LOC Commands 0[]  1[x]  2[]       2 Best Gaze 0[x]  1[]  2[]       3 Visual 0[x]  1[]  2[]  3[]      4 Facial Palsy 0[]  1[x]  2[]  3[]     Baseline per chart review  5a Motor Arm - left 0[x]  1[]  2[]  3[]  4[]  UN[]    5b Motor Arm - Right 0[x]  1[]  2[]  3[]  4[]  UN[]    6a Motor Leg - Left 0[]  1[x]  2[]  3[]  4[]  UN[]    6b Motor Leg - Right 0[]  1[]  2[x]   3[]  4[]  UN[]    7 Limb Ataxia 0[]  1[]  2[]  UN[]      8 Sensory 0[]  1[x]  2[]  UN[]      9 Best Language 0[]  1[x]  2[]  3[]      10 Dysarthria 0[]  1[x]  2[]  UN[]      11 Extinct. and Inattention 0[]  1[x]  2[]       TOTAL:   10      ROS  Comprehensive ROS performed with assistance of chart review and pertinent positives documented in HPI   Past History   Past Medical History:  Diagnosis Date   Squamous cell carcinoma of chin     No past surgical history on file.  Family History: No family history on file.  Social History  reports that he has quit smoking. He has never used smokeless tobacco. He reports that he does not currently use alcohol. He reports that he does not currently use drugs.  Allergies  Allergen Reactions   Ciprofloxacin    Clindamycin/Lincomycin    Clonidine Derivatives    Cyproheptadine    Lisinopril    Myrbetriq [Mirabegron]    Penicillins    Streptokinases     Medications   Current Facility-Administered Medications:    lactated ringers  bolus 1,000 mL, 1,000 mL, Intravenous, Once, Trifan, Donnice PARAS, MD   sodium chloride  flush (NS)  0.9 % injection 3 mL, 3 mL, Intravenous, Once, Trifan, Donnice PARAS, MD  Current Outpatient Medications:    albuterol  (VENTOLIN  HFA) 108 (90 Base) MCG/ACT inhaler, Inhale into the lungs every 6 (six) hours as needed for wheezing or shortness of breath., Disp: , Rfl:    b complex vitamins capsule, Take 1 capsule by mouth daily., Disp: , Rfl:    diphenhydrAMINE (BENADRYL) 25 mg capsule, Take 25 mg by mouth every 6 (six) hours as needed., Disp: , Rfl:    famotidine (PEPCID) 20 MG tablet, Take 20 mg by mouth 2 (two) times daily., Disp: , Rfl:    furosemide (LASIX) 20 MG tablet, Take 20 mg by mouth., Disp: , Rfl:    gabapentin  (NEURONTIN ) 300 MG capsule, Take 300 mg by mouth 2 (two) times daily., Disp: , Rfl:    lidocaine (XYLOCAINE) 2 % solution, Use as directed 15 mLs in the mouth or throat as needed for mouth pain., Disp: , Rfl:     losartan (COZAAR) 100 MG tablet, Take 100 mg by mouth daily., Disp: , Rfl:    Multiple Vitamins-Minerals (MULTIVITAMIN WITH MINERALS) tablet, Take 1 tablet by mouth daily., Disp: , Rfl:    potassium chloride  SA (KLOR-CON ) 20 MEQ tablet, Take 20 mEq by mouth once., Disp: , Rfl:    tamsulosin (FLOMAX) 0.4 MG CAPS capsule, Take 0.4 mg by mouth., Disp: , Rfl:   Vitals   Vitals:   12-Jul-2024 1038  Weight: 80.4 kg    Body mass index is 27.76 kg/m.   Physical Exam   Constitutional: Appears acutely and chronically ill.  Cardiovascular: Normal rate and regular rhythm. Hypotension Respiratory: Labored, abdominal breathing. NRB mask in use.  GI: Distended with engorged, vertical veins on anterior abdomen Skin: pale, rubor, darkened, shiny. Right forehead sore.   Neurologic Examination   Neuro: Mental Status: Patient is awake, alert, oriented to self, age. Not to time, place or situation. Poor attention and concentration. Unable to follow complex commands.  Mild aphasia present. Left neglect present.  Cranial Nerves: II: Visual Fields are full. Pupils are equal, round, and reactive to light.   III,IV, VI: EOMI without ptosis or diploplia.  V: Facial sensation is symmetric to light touch VII: Right facial droop present. (Per chart review, this is baseline secondary previous skin graft and facial reconstruction surgery post squamous cell carcinoma treatment) VIII: hearing is intact to voice (R ear removed secondary to [facility] treatment) X: Dysarthria present XI: Shoulder shrug is symmetric. XII: tongue is midline  Motor: Bulk is normal. Tone: mildly increased BUE.  RUE: Increased tone and rigidity, no drift LUE: Increased tone and rigidity, no drift RLE: Mild drift, 4/5 LLE: Mid drift, 4+/5 Sensory: Sensation is decreased to left arm and leg.  Cerebellar: Unable to complete   Labs/Imaging/Neurodiagnostic studies   CBC:  Recent Labs  Lab 07-12-2024 1036 07-12-24 1037  WBC 34.9*  --    HGB 9.1* 9.5*  HCT 28.9* 28.0*  MCV 92.6  --   PLT 272  --    Basic Metabolic Panel:  Lab Results  Component Value Date   NA 135 2024/07/12   K 4.4 2024-07-12   CO2 26 03/23/2024   GLUCOSE 68 (L) 07/12/2024   BUN 43 (H) Jul 12, 2024   CREATININE 2.20 (H) 2024-07-12   CALCIUM 9.3 03/23/2024   GFRNONAA 46 (L) 03/23/2024   Lipid Panel: No results found for: LDLCALC HgbA1c: No results found for: HGBA1C Urine Drug Screen: No results found for: LABOPIA, COCAINSCRNUR, LABBENZ,  AMPHETMU, THCU, LABBARB  Alcohol Level No results found for: ETH INR No results found for: INR APTT No results found for: APTT AED levels: No results found for: PHENYTOIN, ZONISAMIDE, LAMOTRIGINE, LEVETIRACETA  CT Head without contrast(Personally reviewed): No acute cortically based infarct or intracranial hemorrhage identified.  ASPECTS 10. New left vertex skull erosion since 05/31/2024, suspicious for a metastatic lesion.  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    Per EMS, they were called out to patient's facility of [facility] for breathing difficulties.  On their assessment, patient's oxygen saturation was reportedly on the 60s.  They noted slurred speech and right facial droop and activated code stroke.  En route with EMS, patient was hypotensive with blood pressure in the 80s and low 90s, was started on fluid. Code Sepsis was activated on arrival to ED. NIH 10 on neurological exam with aphasia, dysarthria,  left-side neglect and decreased sensation to left arm and leg.   Continued hypotension with systolic's momentarily dropping into the upper 60s.  MAPs did remain above 60 throughout. CBG: 66. Patient arrived to ED on NRB.   Impression: Sepsis versus Aspiration Pneumonia versus Acute Toxic-Metabolic Encephalopathy versus Stroke (lower on differentials, but would evaluate fully with MRI)   RECOMMENDATIONS   - MRI Brain   If negative, treatment of multiple medical co-morbidities/encephalopathy per EDP.   If positive, stroke workup. ______________________________________________________________________   Phillip Rocky JAYSON Judithe, NP [facility]   I have seen the patient reviewed the above note.  He does appear to have some degree of left-sided neglect and therefore an MRI I do think is pertinent.  He also has severe frontotemporal atrophy, and it is certainly possible that he may have some baseline deficit that is worsened in the setting of his acute sepsis.   We will follow-up MRI, and he will need stroke workup if positive, but if negative then we will sign off.  Phillip Seals, MD [facility]   If 7pm- 7am, please page neurology on call as listed in AMION.

## 2024-06-26 NOTE — Progress Notes (Addendum)
 Pharmacy Antibiotic Note  Phillip Logan is a 78 y.o. male for which pharmacy has been consulted for cefepime  dosing for pneumonia.  Patient with a history of squamous cell carcinoma, COPD, HTN, Clotting disordered, cervical fusion. Patient presenting as code stroke (AMS, hypoxia, garbled speech)  SCr 2.2 - AKI WBC 34.9; LA 1.5; T 100.8; HR 116; RR 16  Vancomycin  and flagyl  x 1 in ED  Plan: Linezolid  per MD Cefepime  2g q24hr  Monitor WBC, fever, renal function, cultures De-escalate when able F/u MRSA PCR  Height: 5' 7 (170.2 cm) Weight: 80.4 kg (177 lb 4 oz) IBW/kg (Calculated) : 66.1  Temp (24hrs), Avg:100.8 F (38.2 C), Min:100.8 F (38.2 C), Max:100.8 F (38.2 C)  Recent Labs  Lab 06/26/24 1036 06/26/24 1037 06/26/24 1042  WBC 34.9*  --   --   CREATININE 2.15* 2.20*  --   LATICACIDVEN  --   --  1.5    Estimated Creatinine Clearance: 29 mL/min (A) (by C-G formula based on SCr of 2.2 mg/dL (H)).    Allergies  Allergen Reactions   Ciprofloxacin    Clindamycin/Lincomycin    Clonidine Derivatives    Cyproheptadine    Lisinopril    Myrbetriq [Mirabegron]    Penicillins    Streptokinases    Microbiology results: Pending  Thank you for allowing pharmacy to be a part of this patient's care.  Dorn Buttner, PharmD, BCPS 06/26/2024 12:31 PM ED Clinical Pharmacist -  [phone]

## 2024-06-26 NOTE — Procedures (Cosign Needed Addendum)
 Central Venous Catheter Insertion Procedure Note  Phillip Logan  [id number]  06/11/1946  Date:06/26/24  Time:6:01 PM   Provider Performing:Johnny Gorter D. Harris   Procedure: Insertion of Non-tunneled Central Venous Catheter(36556) with US  guidance (23062)   Indication(s) Difficult access  Consent Unable to obtain consent due to emergent nature of procedure.  Anesthesia Topical only with 1% lidocaine   Timeout Verified patient identification, verified procedure, site/side was marked, verified correct patient position, special equipment/implants available, medications/allergies/relevant history reviewed, required imaging and test results available.  Sterile Technique Maximal sterile technique including full sterile barrier drape, hand hygiene, sterile gown, sterile gloves, mask, hair covering, sterile ultrasound probe cover (if used).  Procedure Description Area of catheter insertion was cleaned with chlorhexidine  and draped in sterile fashion.  With real-time ultrasound guidance a central venous catheter was placed into the right femoral vein. Nonpulsatile blood flow and easy flushing noted in all ports.  The catheter was sutured in place and sterile dressing applied.  Complications/Tolerance None; patient tolerated the procedure well. Chest X-ray is ordered to verify placement for internal jugular or subclavian cannulation.   Chest x-ray is not ordered for femoral cannulation.  EBL Minimal  Specimen(s) None  Phillip Logan D. Harris, NP-C [facility] Personal contact information can be found on Amion  If no contact or response made please call [phone] 06/26/2024, 6:02 PM

## 2024-06-26 NOTE — ED Triage Notes (Signed)
 Pt to ED via [facility] from [facility]. Code Stroke called at 1009. Per EMS pt had vomiting last night, and felt short of breath this am when staff called EMS. Pt had right facial droop and garbled speech which was new for pt per staff so code stroke called. Pt A&O x 3, c/o pain in right arm. Pt moaning constantly. Pt's LKW = 2200 yesterday per pt, staff were unaware.  EMS VS 85/37, 90/60 HR 130 02 sat 68% on normal 3LNC on arrival, pt placed on 15L NRB, 90% Temp 102.6 Cbg 81 20g L wrist started, small amount of NS infused prior to arrival.

## 2024-06-26 NOTE — H&P (Signed)
 NAME:  Phillip Logan, MRN:  [id number], DOB:  December 17, 1946, LOS: 0 ADMISSION DATE:  06/26/2024, CONSULTATION DATE:  06/26/24 REFERRING MD:  ED, CHIEF COMPLAINT:  dysarthria   History of Present Illness:  78 year old man chronically ill resident of skilled nursing facility after recent admission to [facility] with pneumonia, on presentation this admission initial code stroke canceled, found to be more hypoxemic with bilateral infiltrates concerning for pneumonia with low blood pressure concerning for shock.  History difficult.  He is a bit encephalopathic.  Poor historian.  Per wife he was at [facility] for a couple weeks last month.  Treated for pneumonia, was discharged with oxygen which was new.  States that his sugars were all over the place high and low.  Long time getting that sorted out.  Doing rehab at skilled nursing facility.  Was felt to be altered this morning also noted to be hypoxemic.  60% on room air.  On arrival he was in the 80s on 3 L.  Increasing nonrebreather.  Subsequent hypoxemia is improved.  CT head was nonconcerning for acute findings.  Awaiting MRI.  Dysarthria now back to close to the baseline per wife report time of PCCM evaluation.  Blood pressure is low.  He is gotten almost 2 L.  Mild improvement still low.  Starting pressors.  Additional liter ordered.  Working up pneumonia and other sources of infection.  Pertinent  Medical History  Prior head neck cancer s/p resection, recent admission for pneumonia and hyper and hypoglycemia  Significant Hospital Events: Including procedures, antibiotic start and stop dates in addition to other pertinent events   7/5 admitted from [place] skilled nursing facility with altered mental status and dysarthria, code stroke called but deemed not stroke, dysarthria deemed at baseline after discussion with wife per admitting physician, severe hypoxemia, subsequent improvement, low blood pressure concerning for  hypovolemic versus septic shock due to combination of acute  Interim History / Subjective:    Objective    Blood pressure (!) 79/47, pulse (!) 120, temperature (!) 100.8 F (38.2 C), temperature source Oral, resp. rate (!) 21, height 5' 7 (1.702 m), weight 80.4 kg, SpO2 96%.        Intake/Output Summary (Last 24 hours) at 06/26/2024 1230 Last data filed at 06/26/2024 1152 Gross per 24 hour  Intake --  Output 50 ml  Net -50 ml   Filed Weights   06/26/24 1038  Weight: 80.4 kg    Examination: General: Chronically ill-appearing lying in stretcher HENT: No icterus, well-healed surgical scar with asymmetric right neck noted Lungs: Coarse decreased throughout, normal work of breathing on 2 L nasal cannula Cardiovascular: Tachycardic, regular rhythm Abdomen: Nondistended, nontender Extremities: No edema, warm, chronic venous stasis changes noted bilaterally Neuro: Alert, conversant, dysarthric (at baseline per discussion with wife)   Resolved problem list   Assessment and Plan   Acute on chronic hypoxemic respiratory failure related to bilateral infiltrates suspicious for atypical pneumonia: -- Oxygen supplementation, has improved since initial presentation and is likely close to baseline -- Check RVP, urine Legionella, urine strep -- Cefepime , linezolid  for broad-spectrum coverage given recent admission for pneumonia at outside hospital as well as he currently resides at skilled nursing facility -- Titrate oxygen goal 90%  Shock: Related to hypovolemia and concern for component of sepsis, septic shock as well given source of infection. 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  Was worse in the preceding 24 hours now better per  discussion with wife.  Suspect related to sepsis.  Hopefully improved with treatment.  Initial code stroke, head CT not concerning. --Continue to monitor -- N.p.o. for now -- MRI per neurology  History of hyperglycemia: Blood sugar issues high and low recent admission per wife report.  Initial blood sugars a bit low. -- Every 4 hour glucose checks with sliding scale   Best Practice (right click and Reselect all SmartList Selections daily)   Diet/type: NPO DVT prophylaxis prophylactic heparin   Pressure ulcer(s): pressure ulcer assessment deferred  GI prophylaxis: N/A Lines: N/A Foley:  N/A Code Status:  full code Last date of multidisciplinary goals of care discussion [deferred a full code, he is encephalopathic]  Labs   CBC: Recent Labs  Lab 06/26/24 1036 06/26/24 1037  WBC 34.9*  --   NEUTROABS 31.6*  --   HGB 9.1* 9.5*  HCT 28.9* 28.0*  MCV 92.6  --   PLT 272  --     Basic Metabolic Panel: Recent Labs  Lab 06/26/24 1036 06/26/24 1037  NA 137 135  K 4.2 4.4  CL 100 100  CO2 25  --   GLUCOSE 76 68*  BUN 39* 43*  CREATININE 2.15* 2.20*  CALCIUM 8.0*  --    GFR: Estimated Creatinine Clearance: 29 mL/min (A) (by C-G formula based on SCr of 2.2 mg/dL (H)). Recent Labs  Lab 06/26/24 1036 06/26/24 1042  WBC 34.9*  --   LATICACIDVEN  --  1.5    Liver Function Tests: Recent Labs  Lab 06/26/24 1036  AST 24  ALT 18  ALKPHOS 67  BILITOT 0.9  PROT 5.6*  ALBUMIN  1.9*   No results for input(s): LIPASE, AMYLASE in the last 168 hours. No results for input(s): AMMONIA in the last 168 hours.  ABG    Component Value Date/Time   TCO2 27 06/26/2024 1037     Coagulation Profile: Recent Labs  Lab 06/26/24 1036  INR 1.3*    Cardiac Enzymes: No results for input(s): CKTOTAL, CKMB, CKMBINDEX, TROPONINI in the last 168 hours.  HbA1C: No results found for: HGBA1C  CBG: Recent Labs  Lab 06/26/24 1032  GLUCAP 66*    Review of  Systems:   Unable to obtain due to encephalopathy  Past Medical History:  He,  has a past medical history of Squamous cell carcinoma of chin.   Surgical History:  History reviewed. No pertinent surgical history.   Social History:   reports that he has quit smoking. He has never used smokeless tobacco. He reports that he does not currently use alcohol. He reports that he does not currently use drugs.   Family History:  His family history is not on file.   Allergies Allergies  Allergen Reactions   Ciprofloxacin    Clindamycin/Lincomycin    Clonidine Derivatives    Cyproheptadine    Lisinopril    Myrbetriq [Mirabegron]    Penicillins    Streptokinases      Home Medications  Prior to Admission medications   Medication Sig Start Date End Date Taking? Authorizing Provider  albuterol  (VENTOLIN  HFA) 108 (90 Base) MCG/ACT inhaler Inhale into the lungs every 6 (six) hours as needed for wheezing or shortness of breath.    [provider]  b complex vitamins capsule Take 1 capsule  by mouth daily.    [provider]  diphenhydrAMINE (BENADRYL) 25 mg capsule Take 25 mg by mouth every 6 (six) hours as needed.    [provider]  famotidine (PEPCID) 20 MG tablet Take 20 mg by mouth 2 (two) times daily.    [provider]  furosemide (LASIX) 20 MG tablet Take 20 mg by mouth.    [provider]  gabapentin  (NEURONTIN ) 300 MG capsule Take 300 mg by mouth 2 (two) times daily.    [provider]  lidocaine (XYLOCAINE) 2 % solution Use as directed 15 mLs in the mouth or throat as needed for mouth pain.    [provider]  losartan (COZAAR) 100 MG tablet Take 100 mg by mouth daily. 03/20/24   [provider]  Multiple Vitamins-Minerals (MULTIVITAMIN WITH MINERALS) tablet Take 1 tablet by mouth daily.    [provider]  potassium chloride  SA (KLOR-CON ) 20 MEQ tablet Take 20 mEq by mouth once.    [provider]   tamsulosin (FLOMAX) 0.4 MG CAPS capsule Take 0.4 mg by mouth.    [provider]     Critical care time:    CRITICAL CARE Performed by: Donnice JONELLE Beals   Total critical care time: 40 minutes  Critical care time was exclusive of separately billable procedures and treating other patients.  Critical care was necessary to treat or prevent imminent or life-threatening deterioration.  Critical care was time spent personally by me on the following activities: development of treatment plan with patient and/or surrogate as well as nursing, discussions with consultants, evaluation of patient's response to treatment, examination of patient, obtaining history from patient or surrogate, ordering and performing treatments and interventions, ordering and review of laboratory studies, ordering and review of radiographic studies, pulse oximetry and re-evaluation of patient's condition.  Donnice JONELLE Beals, MD See [facility]ACE[facility]

## 2024-06-26 NOTE — Plan of Care (Signed)

## 2024-06-26 NOTE — Sepsis Progress Note (Signed)
 Elink following code sepsis

## 2024-06-26 NOTE — Consult Note (Signed)
 WOC Nurse Consult Note: Reason for Consult: DTI coccyx  Wound type: Deep Tissue Pressure Injury coccyx/buttocks  Pressure Injury POA: Yes Measurement: see nursing flowsheet  Wound bed: purple maroon discoloration  Drainage (amount, consistency, odor) see nursing flowsheet  Periwound: erythema and moisture associated skin damage  Dressing procedure/placement/frequency:  Cleanse sacrum/coccyx/buttocks with Vashe wound cleanser Soila [phone]) do not rinse and allow to air dry. Apply Xeroform gauze (Lawson [phone]) to area of purple maroon discoloration daily and cover with ABD pad (would avoid silicone foam as may hold moisture on skin).   Will also write for Gerhardt's Butt Cream 2 times daily and prn soiling for surrounding intact skin.   POC discussed with bedside nurse.  Patient would benefit from a low air loss mattress if moved out of ICU setting for pressure redistribution and moisture management. WOC team will not follow.  Re-consult if further needs arise.  DTPI are high risk to deteriorate.    Thank you,    Powell Bar MSN, RN-BC, [employer]

## 2024-06-26 NOTE — Code Documentation (Addendum)
 Stroke Response Nurse Documentation Code Documentation  Phillip Logan is a 78 y.o. male arriving to [facility]  via [facility] on 06/26/2024 with past medical hx of COPD, HTN, Clotting Disorder, R facial reconstruction surgery. Code stroke was activated by EMS.   Patient from [facility] where he was LKW yesterday at 2200. EMS reports being called to scene for shortness of breath with oxygen desaturation. Upon their evaluation, they noted slurred speech and right facial droop.  Stroke team at the bedside on patient arrival. Labs drawn and patient cleared for CT by Dr. Cottie. Patient to CT with team.   NIHSS 10, see documentation for details and code stroke times. Patient with disoriented, not following commands, right facial droop, bilateral (R>L) leg weakness, right decreased sensation, Global aphasia , dysarthria , and right sensory neglect on exam.   The following imaging was completed:  CT Head, CTA, and CTP.   Patient is not a candidate for IV Thrombolytic due to LKW yesterday. Patient is not a candidate for IR due to imaging negative for LVO.   Care Plan: q2h NIHSS and VS.   Process Delays Noted: n/a  Bedside handoff with ED RN Gustavus Leonor CROME Daris Harkins  Rapid Response RN

## 2024-06-27 DIAGNOSIS — R6521 Severe sepsis with septic shock: Secondary | ICD-10-CM | POA: Diagnosis not present

## 2024-06-27 DIAGNOSIS — I959 Hypotension, unspecified: Secondary | ICD-10-CM | POA: Diagnosis not present

## 2024-06-27 DIAGNOSIS — A419 Sepsis, unspecified organism: Secondary | ICD-10-CM | POA: Diagnosis not present

## 2024-06-27 DIAGNOSIS — G9341 Metabolic encephalopathy: Secondary | ICD-10-CM | POA: Diagnosis not present

## 2024-06-27 DIAGNOSIS — J9621 Acute and chronic respiratory failure with hypoxia: Secondary | ICD-10-CM | POA: Diagnosis not present

## 2024-06-27 LAB — BASIC METABOLIC PANEL WITH GFR
Anion gap: 9 (ref 5–15)
BUN: 32 mg/dL — ABNORMAL HIGH (ref 8–23)
CO2: 24 mmol/L (ref 22–32)
Calcium: 8 mg/dL — ABNORMAL LOW (ref 8.9–10.3)
Chloride: 100 mmol/L (ref 98–111)
Creatinine, Ser: 1.33 mg/dL — ABNORMAL HIGH (ref 0.61–1.24)
GFR, Estimated: 55 mL/min — ABNORMAL LOW (ref >60)
Glucose, Bld: 95 mg/dL (ref 70–99)
Potassium: 4 mmol/L (ref 3.5–5.1)
Sodium: 133 mmol/L — ABNORMAL LOW (ref 135–145)

## 2024-06-27 LAB — BLOOD CULTURE ID PANEL (REFLEXED) - BCID2

## 2024-06-27 LAB — GLUCOSE, CAPILLARY
Glucose-Capillary: 95 mg/dL (ref 70–99)
Glucose-Capillary: 100 mg/dL — ABNORMAL HIGH (ref 70–99)
Glucose-Capillary: 147 mg/dL — ABNORMAL HIGH (ref 70–99)
Glucose-Capillary: 176 mg/dL — ABNORMAL HIGH (ref 70–99)
Glucose-Capillary: 135 mg/dL — ABNORMAL HIGH (ref 70–99)

## 2024-06-27 LAB — CBC
HCT: 24.7 % — ABNORMAL LOW (ref 39.0–52.0)
Hemoglobin: 7.7 g/dL — ABNORMAL LOW (ref 13.0–17.0)
MCH: 28.6 pg (ref 26.0–34.0)
MCHC: 31.2 g/dL (ref 30.0–36.0)
MCV: 91.8 fL (ref 80.0–100.0)
Platelets: 253 K/uL (ref 150–400)
RBC: 2.69 MIL/uL — ABNORMAL LOW (ref 4.22–5.81)
RDW: 15.7 % — ABNORMAL HIGH (ref 11.5–15.5)
WBC: 28.1 K/uL — ABNORMAL HIGH (ref 4.0–10.5)
nRBC: 0 % (ref 0.0–0.2)

## 2024-06-27 LAB — LEGIONELLA PNEUMOPHILA SEROGP 1 UR AG: L. pneumophila Serogp 1 Ur Ag: NEGATIVE

## 2024-06-27 LAB — PHOSPHORUS: Phosphorus: 3.5 mg/dL (ref 2.5–4.6)

## 2024-06-27 LAB — MAGNESIUM
Magnesium: 1.3 mg/dL — ABNORMAL LOW (ref 1.7–2.4)
Magnesium: 2.6 mg/dL — ABNORMAL HIGH (ref 1.7–2.4)

## 2024-06-27 MED ORDER — MAGNESIUM SULFATE 4 GM/100ML IV SOLN
4.0000 g | Freq: Once | INTRAVENOUS | Status: AC
  Administered 2024-06-27: 4 g via INTRAVENOUS
  Filled 2024-06-27: qty 100

## 2024-06-27 MED ORDER — ALBUMIN HUMAN 25 % IV SOLN
25.0000 g | Freq: Four times a day (QID) | INTRAVENOUS | Status: AC
  Administered 2024-06-27 (×3): 25 g via INTRAVENOUS
  Filled 2024-06-27 (×4): qty 100

## 2024-06-27 MED ORDER — SODIUM CHLORIDE 0.9 % IV SOLN
2.0000 g | Freq: Two times a day (BID) | INTRAVENOUS | Status: DC
  Administered 2024-06-27 – 2024-06-30 (×7): 2 g via INTRAVENOUS
  Filled 2024-06-27 (×7): qty 12.5

## 2024-06-27 MED ORDER — MAGNESIUM SULFATE 2 GM/50ML IV SOLN
2.0000 g | Freq: Once | INTRAVENOUS | Status: AC
  Administered 2024-06-27: 2 g via INTRAVENOUS
  Filled 2024-06-27: qty 50

## 2024-06-27 MED ORDER — AZITHROMYCIN 500 MG PO TABS
500.0000 mg | ORAL_TABLET | Freq: Every day | ORAL | Status: AC
  Administered 2024-06-27 – 2024-06-29 (×3): 500 mg via ORAL
  Filled 2024-06-27 (×3): qty 1

## 2024-06-27 MED ORDER — GABAPENTIN 400 MG PO CAPS
400.0000 mg | ORAL_CAPSULE | Freq: Two times a day (BID) | ORAL | Status: DC
  Administered 2024-06-27 – 2024-07-01 (×8): 400 mg via ORAL
  Filled 2024-06-27 (×9): qty 1

## 2024-06-27 MED ORDER — COLCHICINE 0.6 MG PO TABS
0.6000 mg | ORAL_TABLET | Freq: Two times a day (BID) | ORAL | Status: DC
  Administered 2024-06-27 – 2024-07-01 (×9): 0.6 mg via ORAL
  Filled 2024-06-27 (×16): qty 1

## 2024-06-27 NOTE — Progress Notes (Signed)
 NEUROLOGY CONSULT FOLLOW UP NOTE   Date of service: June 27, 2024 Patient Name: Phillip Logan MRN:  [id number] DOB:  May 08, 1946  Interval Hx/subjective   In bed, NAD, eating lunch Vitals   Vitals:   06/27/24 1215 06/27/24 1227 06/27/24 1230 06/27/24 1245  BP:      Pulse: 86 91 87 89  Resp: 16 16 18 18   Temp:      TempSrc:      SpO2: 96% 96% 94% 91%  Weight:      Height:         Body mass index is 28.66 kg/m.  Physical Exam   Constitutional: Appears well-developed and well-nourished.  Neurologic Examination    MS: Awake, alert, knows the month but not year.  He has no signs of neglect, speech is much more clear than yesterday.  He is able to speak fluently. CN: Able to count fingers bilaterally, he has chronic right facial weakness/scarring Motor: No drift on either upper extremity or either lower extremity Sensory: Reports symmetric sensation, no extinguishing to double simultaneous stimulation  Medications  Current Facility-Administered Medications:    acetaminophen  (TYLENOL ) tablet 650 mg, 650 mg, Oral, Q4H PRN, Hunsucker, Donnice SAUNDERS, MD   albumin  human 25 % solution 25 g, 25 g, Intravenous, Q6H, Dewald, Jonathan B, MD, Stopped at 06/27/24 1126   azithromycin  (ZITHROMAX ) tablet 500 mg, 500 mg, Oral, Daily, Kara Carrier B, MD, 500 mg at 06/27/24 1002   ceFEPIme  (MAXIPIME ) 2 g in sodium chloride  0.9 % 100 mL IVPB, 2 g, Intravenous, Q12H, Zelphia Randall PEDLAR, RPH, Last Rate: 200 mL/hr at 06/27/24 1227, 2 g at 06/27/24 1227   Chlorhexidine  Gluconate Cloth 2 % PADS 6 each, 6 each, Topical, Daily, Hunsucker, Donnice SAUNDERS, MD, 6 each at 06/27/24 9344   colchicine  tablet 0.6 mg, 0.6 mg, Oral, BID, Kara Carrier B, MD, 0.6 mg at 06/27/24 1227   docusate sodium  (COLACE) capsule 100 mg, 100 mg, Oral, BID PRN, Hunsucker, Donnice SAUNDERS, MD   gabapentin  (NEURONTIN ) capsule 400 mg, 400 mg, Oral, BID, Kara, Jonathan B, MD   Gerhardt's butt cream, , Topical, BID, Hunsucker,  Donnice SAUNDERS, MD, Given at 06/27/24 9043   heparin  injection 5,000 Units, 5,000 Units, Subcutaneous, Q8H, Hunsucker, Donnice SAUNDERS, MD, 5,000 Units at 06/27/24 0631   insulin  aspart (novoLOG ) injection 0-9 Units, 0-9 Units, Subcutaneous, Q4H, Hunsucker, Donnice SAUNDERS, MD, 1 Units at 06/27/24 1227   linezolid  (ZYVOX ) IVPB 600 mg, 600 mg, Intravenous, Q12H, Kara Carrier NOVAK, MD, Stopped at 06/26/24 2224   nicotine  (NICODERM CQ  - dosed in mg/24 hours) patch 14 mg, 14 mg, Transdermal, Daily, Hunsucker, Donnice SAUNDERS, MD, 14 mg at 06/26/24 2020   norepinephrine  (LEVOPHED ) 4mg  in (0.016 mg/mL) premix infusion, 0-40 mcg/min, Intravenous, Titrated, Hunsucker, Donnice SAUNDERS, MD, Stopped at 06/27/24 1134   ondansetron  (ZOFRAN ) injection 4 mg, 4 mg, Intravenous, Q6H PRN, Hunsucker, Donnice SAUNDERS, MD   Oral care mouth rinse, 15 mL, Mouth Rinse, PRN, Hunsucker, Donnice SAUNDERS, MD, 15 mL at 06/27/24 0755   oxyCODONE  (Oxy IR/ROXICODONE ) immediate release tablet 5 mg, 5 mg, Oral, Q6H PRN, Hunsucker, Donnice SAUNDERS, MD, 5 mg at 06/27/24 1227   polyethylene glycol (MIRALAX  / GLYCOLAX ) packet 17 g, 17 g, Oral, Daily PRN, Hunsucker, Donnice SAUNDERS, MD   sodium chloride  flush (NS) 0.9 % injection 10-40 mL, 10-40 mL, Intracatheter, Q12H, Hunsucker, Donnice SAUNDERS, MD, 20 mL at 06/27/24 0956   sodium chloride  flush (NS) 0.9 % injection 10-40 mL, 10-40 mL, Intracatheter, PRN,  Hunsucker, Donnice SAUNDERS, MD   sodium chloride  flush (NS) 0.9 % injection 3 mL, 3 mL, Intravenous, Once, Trifan, Donnice PARAS, MD  Labs and Diagnostic Imaging    Imaging(Personally reviewed): CT head is negative  Assessment   Phillip Logan is a 78 y.o. male who presented with severe encephalopathy in the setting of hypotension/sepsis.  My suspicion is that this was likely metabolic in nature, there was some question about extinguishing to double simultaneous stimulation, but his responses were somewhat inconsistent.  With his improvement, I think that an MRI is relatively low  yield.  Neurology will be available as needed.  Recommendations  Please call if there remain any further questions or concerns. ______________________________________________________________________   Bonney Aisha Seals, MD [facility]

## 2024-06-27 NOTE — Progress Notes (Signed)
 NAME:  Phillip Logan, MRN:  [id number], DOB:  1946-08-29, LOS: 1 ADMISSION DATE:  06/26/2024, CONSULTATION DATE:  06/27/24 REFERRING MD:  ED, CHIEF COMPLAINT:  dysarthria   History of Present Illness:  78 year old man chronically ill resident of skilled nursing facility after recent admission to [facility] with pneumonia, on presentation this admission initial code stroke canceled, found to be more hypoxemic with bilateral infiltrates concerning for pneumonia with low blood pressure concerning for shock.  History difficult.  He is a bit encephalopathic.  Poor historian.  Per wife he was at [facility] for a couple weeks last month.  Treated for pneumonia, was discharged with oxygen which was new.  States that his sugars were all over the place high and low.  Long time getting that sorted out.  Doing rehab at skilled nursing facility.  Was felt to be altered this morning also noted to be hypoxemic.  60% on room air.  On arrival he was in the 80s on 3 L.  Increasing nonrebreather.  Subsequent hypoxemia is improved.  CT head was nonconcerning for acute findings.  Awaiting MRI.  Dysarthria now back to close to the baseline per wife report time of PCCM evaluation.  Blood pressure is low.  He is gotten almost 2 L.  Mild improvement still low.  Starting pressors.  Additional liter ordered.  Working up pneumonia and other sources of infection.  Pertinent  Medical History  Prior head neck cancer s/p resection, recent admission for pneumonia and hyper and hypoglycemia  Significant Hospital Events: Including procedures, antibiotic start and stop dates in addition to other pertinent events   7/5 admitted from [facility] skilled nursing facility with altered mental status and dysarthria, code stroke called but deemed not stroke, dysarthria deemed at baseline after discussion with wife per admitting physician, severe hypoxemia, subsequent improvement, low blood pressure concerning for  hypovolemic versus septic shock due to combination of acute  Interim History / Subjective:   No acute events overnight Sitting up eating breakfast, no complaints He is ok with receiving albumin   Objective    Blood pressure (!) 109/49, pulse 85, temperature (!) 97.1 F (36.2 C), temperature source Axillary, resp. rate 12, height 5' 7 (1.702 m), weight 83 kg, SpO2 97%.        Intake/Output Summary (Last 24 hours) at 06/27/2024 0734 Last data filed at 06/27/2024 0701 Gross per 24 hour  Intake 5375.47 ml  Output 1800 ml  Net 3575.47 ml   Filed Weights   06/26/24 1038 06/27/24 0500  Weight: 80.4 kg 83 kg    Examination: General: Chronically ill-appearing male, sitting up in bed HENT: No icterus, well-healed surgical scar with asymmetric right neck noted Lungs: decreased breath sounds, no wheezing Cardiovascular: regular regular rhythm Abdomen: Nondistended, nontender Extremities: trace edema, warm, chronic venous stasis changes noted bilaterally Neuro: Alert, conversant, dysarthric (at baseline per wife)   Resolved problem list   Assessment and Plan   Acute on chronic hypoxemic respiratory failure  Pneumonia - Wean oxygen for goal SpO2 92% or higher - strep pneumo urine ag is negative - follow up urine legionella - continue cefepime , linezolid , add azithromycin  today  Septic Shock - MAP goal 65 or greater - remains on low dose levophed  - give albumin  25g q6hrs x 3 doses - Has been adequately fluid resuscitated - follow up cultures  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  Was worse in the preceding 24 hours now better per discussion with wife.  Suspect related  to sepsis.  Hopefully improved with treatment.  Initial code stroke, head CT not concerning. --Continue to monitor -- MRI per neurology  History of hyperglycemia: Blood sugar issues high and low recent admission per wife report.  Initial blood sugars a bit low. -- Every 4 hour glucose checks  with sliding scale   Best Practice (right click and Reselect all SmartList Selections daily)   Diet/type: Regular consistency (see orders) DVT prophylaxis prophylactic heparin   Pressure ulcer(s): pressure ulcer assessment deferred  GI prophylaxis: N/A Lines: Central line and Arterial Line Foley:  N/A Code Status:  full code Last date of multidisciplinary goals of care discussion [pending]  Labs   CBC: Recent Labs  Lab 06/26/24 1036 06/26/24 1037 06/26/24 1407 06/27/24 0625  WBC 34.9*  --  32.0* 28.1*  NEUTROABS 31.6*  --   --   --   HGB 9.1* 9.5* 7.2* 7.7*  HCT 28.9* 28.0* 22.9* 24.7*  MCV 92.6  --  92.0 91.8  PLT 272  --  230 253    Basic Metabolic Panel: Recent Labs  Lab 06/26/24 1036 06/26/24 1037 06/26/24 1407 06/26/24 1800 06/27/24 0625  NA 137 135  --  134* 133*  K 4.2 4.4  --  4.1 4.0  CL 100 100  --  102 100  CO2 25  --   --  22 24  GLUCOSE 76 68*  --  105* 95  BUN 39* 43*  --  35* 32*  CREATININE 2.15* 2.20* 1.85* 1.45* 1.33*  CALCIUM 8.0*  --   --  7.4* 8.0*  MG  --   --   --   --  1.3*  PHOS  --   --   --   --  3.5   GFR: Estimated Creatinine Clearance: 48 mL/min (A) (by C-G formula based on SCr of 1.33 mg/dL (H)). Recent Labs  Lab 06/26/24 1036 06/26/24 1042 06/26/24 1407 06/27/24 0625  WBC 34.9*  --  32.0* 28.1*  LATICACIDVEN  --  1.5  --   --     Liver Function Tests: Recent Labs  Lab 06/26/24 1036  AST 24  ALT 18  ALKPHOS 67  BILITOT 0.9  PROT 5.6*  ALBUMIN  1.9*   No results for input(s): LIPASE, AMYLASE in the last 168 hours. No results for input(s): AMMONIA in the last 168 hours.  ABG    Component Value Date/Time   TCO2 27 06/26/2024 1037     Coagulation Profile: Recent Labs  Lab 06/26/24 1036  INR 1.3*    Cardiac Enzymes: No results for input(s): CKTOTAL, CKMB, CKMBINDEX, TROPONINI in the last 168 hours.  HbA1C: Hgb A1c MFr Bld  Date/Time Value Ref Range Status  06/26/2024 02:07 PM 5.3 4.8  - 5.6 % Final    Comment:    (NOTE) Diagnosis of Diabetes The following HbA1c ranges recommended by the American Diabetes Association (ADA) may be used as an aid in the diagnosis of diabetes mellitus.  Hemoglobin             Suggested A1C NGSP%              Diagnosis  <5.7                   Non Diabetic  5.7-6.4                Pre-Diabetic  >6.4                   Diabetic  <  7.0                   Glycemic control for                       adults with diabetes.      CBG: Recent Labs  Lab 06/26/24 1608 06/26/24 1945 06/26/24 2318 06/27/24 0305 06/27/24 0709  GLUCAP 86 130* 134* 95 100*       Critical care time:    CRITICAL CARE Performed by: Dorn KATHEE Chill   Total critical care time: 35 minutes  Critical care time was exclusive of separately billable procedures and treating other patients.  Critical care was necessary to treat or prevent imminent or life-threatening deterioration.  Critical care was time spent personally by me on the following activities: development of treatment plan with patient and/or surrogate as well as nursing, discussions with consultants, evaluation of patient's response to treatment, examination of patient, obtaining history from patient or surrogate, ordering and performing treatments and interventions, ordering and review of laboratory studies, ordering and review of radiographic studies, pulse oximetry and re-evaluation of patient's condition.  Dorn KATHEE Chill, MD [facility]

## 2024-06-27 NOTE — Plan of Care (Signed)
  Problem: Education: Goal: Ability to describe self-care measures that may prevent or decrease complications (Diabetes Survival Skills Education) will improve Outcome: Progressing   Problem: Coping: Goal: Ability to adjust to condition or change in health will improve Outcome: Progressing   Problem: Fluid Volume: Goal: Ability to maintain a balanced intake and output will improve Outcome: Progressing   Problem: Health Behavior/Discharge Planning: Goal: Ability to identify and utilize available resources and services will improve Outcome: Progressing   Problem: Nutritional: Goal: Maintenance of adequate nutrition will improve Outcome: Progressing Goal: Progress toward achieving an optimal weight will improve Outcome: Progressing   Problem: Tissue Perfusion: Goal: Adequacy of tissue perfusion will improve Outcome: Progressing   Problem: Clinical Measurements: Goal: Will remain free from infection Outcome: Progressing

## 2024-06-28 DIAGNOSIS — R6521 Severe sepsis with septic shock: Secondary | ICD-10-CM | POA: Diagnosis not present

## 2024-06-28 DIAGNOSIS — J9621 Acute and chronic respiratory failure with hypoxia: Secondary | ICD-10-CM | POA: Diagnosis not present

## 2024-06-28 DIAGNOSIS — A419 Sepsis, unspecified organism: Secondary | ICD-10-CM | POA: Diagnosis not present

## 2024-06-28 DIAGNOSIS — J189 Pneumonia, unspecified organism: Secondary | ICD-10-CM

## 2024-06-28 DIAGNOSIS — D72829 Elevated white blood cell count, unspecified: Secondary | ICD-10-CM

## 2024-06-28 DIAGNOSIS — N179 Acute kidney failure, unspecified: Secondary | ICD-10-CM

## 2024-06-28 LAB — BASIC METABOLIC PANEL WITH GFR
Anion gap: 9 (ref 5–15)
BUN: 28 mg/dL — ABNORMAL HIGH (ref 8–23)
CO2: 25 mmol/L (ref 22–32)
Calcium: 8.3 mg/dL — ABNORMAL LOW (ref 8.9–10.3)
Chloride: 98 mmol/L (ref 98–111)
Creatinine, Ser: 1.19 mg/dL (ref 0.61–1.24)
GFR, Estimated: >60 mL/min (ref >60)
Glucose, Bld: 95 mg/dL (ref 70–99)
Potassium: 4 mmol/L (ref 3.5–5.1)
Sodium: 132 mmol/L — ABNORMAL LOW (ref 135–145)

## 2024-06-28 LAB — CBC
HCT: 21.2 % — ABNORMAL LOW (ref 39.0–52.0)
Hemoglobin: 6.5 g/dL — CL (ref 13.0–17.0)
MCH: 27.9 pg (ref 26.0–34.0)
MCHC: 30.7 g/dL (ref 30.0–36.0)
MCV: 91 fL (ref 80.0–100.0)
Platelets: 193 K/uL (ref 150–400)
RBC: 2.33 MIL/uL — ABNORMAL LOW (ref 4.22–5.81)
RDW: 15.3 % (ref 11.5–15.5)
WBC: 14.3 K/uL — ABNORMAL HIGH (ref 4.0–10.5)
nRBC: 0 % (ref 0.0–0.2)

## 2024-06-28 LAB — ABO/RH: ABO/RH(D): A POS

## 2024-06-28 LAB — PHOSPHORUS: Phosphorus: 2.2 mg/dL — ABNORMAL LOW (ref 2.5–4.6)

## 2024-06-28 LAB — MAGNESIUM: Magnesium: 2.4 mg/dL (ref 1.7–2.4)

## 2024-06-28 LAB — HEMOGLOBIN AND HEMATOCRIT, BLOOD
HCT: 25.4 % — ABNORMAL LOW (ref 39.0–52.0)
Hemoglobin: 8 g/dL — ABNORMAL LOW (ref 13.0–17.0)

## 2024-06-28 LAB — GLUCOSE, CAPILLARY
Glucose-Capillary: 104 mg/dL — ABNORMAL HIGH (ref 70–99)
Glucose-Capillary: 112 mg/dL — ABNORMAL HIGH (ref 70–99)
Glucose-Capillary: 90 mg/dL (ref 70–99)
Glucose-Capillary: 144 mg/dL — ABNORMAL HIGH (ref 70–99)
Glucose-Capillary: 113 mg/dL — ABNORMAL HIGH (ref 70–99)
Glucose-Capillary: 116 mg/dL — ABNORMAL HIGH (ref 70–99)

## 2024-06-28 LAB — PREPARE RBC (CROSSMATCH)

## 2024-06-28 MED ORDER — SODIUM PHOSPHATES 45 MMOLE/15ML IV SOLN
15.0000 mmol | Freq: Once | INTRAVENOUS | Status: AC
  Administered 2024-06-28: 15 mmol via INTRAVENOUS
  Filled 2024-06-28: qty 5

## 2024-06-28 MED ORDER — SODIUM CHLORIDE 0.9% IV SOLUTION: Freq: Once | INTRAVENOUS | Status: DC

## 2024-06-28 MED ORDER — SODIUM CHLORIDE 0.9% IV SOLUTION
Freq: Once | INTRAVENOUS | Status: DC
Start: 2024-06-28 — End: 2024-07-01

## 2024-06-28 NOTE — TOC Initial Note (Signed)
 Transition of Care [facility]) - Initial/Assessment Note    Patient Details  Name: Phillip Logan MRN: [id number] Date of Birth: October 20, 1946  [facility]) CM/SW Contact:    Lauraine FORBES Saa, LCSW Phone Number: 06/28/2024, 10:13 AM  Clinical Narrative:                  10:13 AM Per chart review, patient is from [facility]. SNF admissions confirmed patient was STR at SNF (used 18 days) and is able to return upon insurance authorization approval. Patient has a PCP and insurance.   Expected Discharge Plan: Skilled Nursing Facility Barriers to Discharge: Continued Medical Work up, English as a second language teacher   Patient Goals and CMS Choice            Expected Discharge Plan and Services In-house Referral: Clinical Social Work   Post Acute Care Choice: Skilled Nursing Facility Living arrangements for the past 2 months: Skilled Nursing Facility, Single Family Home                                      Prior Living Arrangements/Services Living arrangements for the past 2 months: Skilled Nursing Facility, Single Family Home Lives with:: Facility Resident Patient language and need for interpreter reviewed:: Yes              Criminal Activity/Legal Involvement Pertinent to Current Situation/Hospitalization: No - Comment as needed  Activities of Daily Living      Permission Sought/Granted Permission sought to share information with : Facility Medical sales representative, Family Supports Permission granted to share information with : No (Contact information on chart)  Share Information with NAME: Beth Brickhouse  Permission granted to share info w AGENCY: [facility]  Permission granted to share info w Relationship: Spouse  Permission granted to share info w Contact Information: [phone]  Emotional Assessment       Orientation: : Oriented to Self, Oriented to Place, Oriented to  Time, Oriented to Situation Alcohol / Substance Use: Not  Applicable Psych Involvement: No (comment)  Admission diagnosis:  Septic shock (HCC) [A41.9, R65.21] Patient Active Problem List   Diagnosis Date Noted   Septic shock (HCC) 06/26/2024   Iron deficiency anemia 01/28/2022   Furuncle of scalp 10/01/2021   Hypomagnesemia 10/05/2020   Cellulitis 10/02/2020   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 [address] [place] [address] Phone: [phone] Fax: [phone]  [facility] [address] [place] [address] Phone: [phone] Fax: [phone]     Social Drivers of Health (SDOH) Social History: SDOH Screenings   Food Insecurity: No Food Insecurity (11/02/2019)   Received from [facility]  Transportation Needs: No Transportation Needs (11/02/2019)   Received from [facility] ([facility])  Financial Resource Strain: Low Risk  (11/02/2019)   Received from [facility])  Tobacco Use: Medium Risk (06/26/2024)   SDOH Interventions:     Readmission Risk Interventions     No data to display

## 2024-06-28 NOTE — Progress Notes (Signed)
 CBG- 112

## 2024-06-28 NOTE — Plan of Care (Signed)
  Problem: Education: Goal: Ability to describe self-care measures that may prevent or decrease complications (Diabetes Survival Skills Education) will improve Outcome: Progressing Goal: Individualized Educational Video(s) Outcome: Progressing   Problem: Skin Integrity: Goal: Risk for impaired skin integrity will decrease Outcome: Progressing   Problem: Elimination: Goal: Will not experience complications related to bowel motility Outcome: Progressing Goal: Will not experience complications related to urinary retention Outcome: Progressing   Problem: Pain Managment: Goal: General experience of comfort will improve and/or be controlled Outcome: Progressing   Problem: Safety: Goal: Ability to remain free from injury will improve Outcome: Progressing

## 2024-06-28 NOTE — Progress Notes (Signed)
 eLink Physician-Brief Progress Note Patient Name: [name] DOB: 08/09/1946 MRN: [id number]   Date of Service  06/28/2024  HPI/Events of Note  Hb 6.5 and no bleeding endorsed.   eICU Interventions  1 unit RBC to be transfused RN to verify consent     Intervention Category Major Interventions: Other:  Mirl Hillery G Jerremy Maione 06/28/2024, 5:06 AM

## 2024-06-28 NOTE — Plan of Care (Signed)

## 2024-06-28 NOTE — Progress Notes (Addendum)
 NAME:  Phillip Logan, MRN:  [id number], DOB:  1946/09/16, LOS: 2 ADMISSION DATE:  06/26/2024, CONSULTATION DATE:  06/28/24 REFERRING MD:  ED, CHIEF COMPLAINT:  dysarthria   History of Present Illness:  78 year old man chronically ill resident of skilled nursing facility after recent admission to [facility] with pneumonia, on presentation this admission initial code stroke canceled, found to be more hypoxemic with bilateral infiltrates concerning for pneumonia with low blood pressure concerning for shock.  History difficult.  He is a bit encephalopathic.  Poor historian.  Per wife he was at [facility] for a couple weeks last month.  Treated for pneumonia, was discharged with oxygen which was new.  States that his sugars were all over the place high and low.  Long time getting that sorted out.  Doing rehab at skilled nursing facility.  Was felt to be altered this morning also noted to be hypoxemic.  60% on room air.  On arrival he was in the 80s on 3 L.  Increasing nonrebreather.  Subsequent hypoxemia is improved.  CT head was nonconcerning for acute findings.  Awaiting MRI.  Dysarthria now back to close to the baseline per wife report time of PCCM evaluation.  Blood pressure is low.  He is gotten almost 2 L.  Mild improvement still low.  Starting pressors.  Additional liter ordered.  Working up pneumonia and other sources of infection.  Pertinent  Medical History  Prior head neck cancer s/p resection, recent admission for pneumonia and hyper and hypoglycemia  Significant Hospital Events: Including procedures, antibiotic start and stop dates in addition to other pertinent events   7/5 admitted from [place] skilled nursing facility with altered mental status and dysarthria, code stroke called but deemed not stroke, dysarthria deemed at baseline after discussion with wife per admitting physician, severe hypoxemia, subsequent improvement, low blood pressure concerning for  hypovolemic versus septic shock due to combination of acute  Interim History / Subjective:   No acute events overnight Blood work did show low hemoglobin  Denies pain, denies any significant discomfort  Objective    Blood pressure 111/75, pulse 88, temperature 97.8 F (36.6 C), temperature source Oral, resp. rate (!) 22, height 5' 7 (1.702 m), weight 87.4 kg, SpO2 97%.        Intake/Output Summary (Last 24 hours) at 06/28/2024 0753 Last data filed at 06/28/2024 0600 Gross per 24 hour  Intake 1906.3 ml  Output 1850 ml  Net 56.3 ml   Filed Weights   06/26/24 1038 06/27/24 0500 06/28/24 0358  Weight: 80.4 kg 83 kg 87.4 kg    Examination: General: Chronically ill-appearing, HENT: Anicteric, moist oral mucosa Lungs: Decreased breath sounds bilaterally Cardiovascular: S1-S2 appreciated Abdomen: Soft, bowel sounds appreciated Extremities: Bilateral chronic venous stasis Neuro: Alert, dysarthric  I reviewed last 24 h vitals and pain scores, last 48 h intake and output, last 24 h labs and trends, and last 24 h imaging results. 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 - Related to prior tumor  resection -Appears to have improved to baseline -MRI per neurology  Leukocytosis improving on current therapy  History of hyperglycemia -Continue glucose checks every 4  AKI - Improving - Maintain renal perfusion - Avoid  nephrotoxic's  Anemia - Ordered 1 unit PRBC  Best Practice (right click and Reselect all SmartList Selections daily)   Diet/type: Regular consistency (see orders) DVT prophylaxis prophylactic heparin   Pressure ulcer(s): pressure ulcer assessment deferred  GI prophylaxis: N/A Lines: Central line and Arterial Line Foley:  N/A Code Status:  full code Last date of multidisciplinary goals of care discussion [ongoing]  Labs   CBC: Recent Labs  Lab 06/26/24 1036 06/26/24 1037 06/26/24 1407 06/27/24 0625 06/28/24 0410  WBC 34.9*  --  32.0* 28.1* 14.3*  NEUTROABS 31.6*  --   --   --   --   HGB 9.1* 9.5* 7.2* 7.7* 6.5*  HCT 28.9* 28.0* 22.9* 24.7* 21.2*  MCV 92.6  --  92.0 91.8 91.0  PLT 272  --  230 253 193    Basic Metabolic Panel: Recent Labs  Lab 06/26/24 1036 06/26/24 1037 06/26/24 1407 06/26/24 1800 06/27/24 0625 06/27/24 2000 06/28/24 0410  NA 137 135  --  134* 133*  --  132*  K 4.2 4.4  --  4.1 4.0  --  4.0  CL 100 100  --  102 100  --  98  CO2 25  --   --  22 24  --  25  GLUCOSE 76 68*  --  105* 95  --  95  BUN 39* 43*  --  35* 32*  --  28*  CREATININE 2.15* 2.20* 1.85* 1.45* 1.33*  --  1.19  CALCIUM 8.0*  --   --  7.4* 8.0*  --  8.3*  MG  --   --   --   --  1.3* 2.6* 2.4  PHOS  --   --   --   --  3.5  --  2.2*   GFR: Estimated Creatinine Clearance: 54.9 mL/min (by C-G formula based on SCr of 1.19 mg/dL). Recent Labs  Lab 06/26/24 1036 06/26/24 1042 06/26/24 1407 06/27/24 0625 06/28/24 0410  WBC 34.9*  --  32.0* 28.1* 14.3*  LATICACIDVEN  --  1.5  --   --   --     Liver Function Tests: Recent Labs  Lab 06/26/24 1036  AST 24  ALT 18  ALKPHOS 67  BILITOT 0.9  PROT 5.6*  ALBUMIN  1.9*   No results for input(s): LIPASE, AMYLASE in the last 168 hours. No results for input(s): AMMONIA in the last 168 hours.  ABG    Component Value Date/Time   TCO2 27 06/26/2024 1037     Coagulation Profile: Recent Labs  Lab 06/26/24 1036  INR  1.3*    Cardiac Enzymes: No results for input(s): CKTOTAL, CKMB, CKMBINDEX, TROPONINI in the last 168 hours.  HbA1C: Hgb A1c MFr Bld  Date/Time Value Ref Range Status  06/26/2024 02:07 PM 5.3 4.8 - 5.6 % Final    Comment:    (NOTE) Diagnosis of Diabetes The following HbA1c ranges recommended by the American Diabetes Association (ADA) may be used as an aid in the diagnosis of diabetes mellitus.  Hemoglobin             Suggested A1C NGSP%              Diagnosis  <5.7  Non Diabetic  5.7-6.4                Pre-Diabetic  >6.4                   Diabetic  <7.0                   Glycemic control for                       adults with diabetes.      CBG: Recent Labs  Lab 06/27/24 0709 06/27/24 1115 06/27/24 1500 06/27/24 1943 06/28/24 0730  GLUCAP 100* 147* 176* 135* 90   The patient is critically ill with multiple organ systems failure and requires high complexity decision making for assessment and support, frequent evaluation and titration of therapies, application of advanced monitoring technologies and extensive interpretation of multiple databases. Critical Care Time devoted to patient care services described in this note independent of APP/resident time (if applicable)  is 33 minutes.   Jennet Epley MD [facility] Personal pager: See Amion If unanswered, please page CCM On-call: #[phone]

## 2024-06-28 NOTE — Progress Notes (Signed)
 [facility] ADULT ICU REPLACEMENT PROTOCOL   The patient does apply for the [facility] Adult ICU Electrolyte Replacment Protocol based on the criteria listed below:   1.Exclusion criteria: TCTS, ECMO, Dialysis, and Myasthenia Gravis patients 2. Is GFR >/= 30 ml/min? Yes.    Patient's GFR today is >60 3. Is SCr </= 2? Yes.   Patient's SCr is 1.19 mg/dL 4. Did SCr increase >/= 0.5 in 24 hours? No. 5.Pt's weight >40kg  Yes.   6. Abnormal electrolyte(s): Phos  7. Electrolytes replaced per protocol 8.  Call MD STAT for K+ </= 2.5, Phos </= 1, or Mag </= 1 Physician:  Fate Hunter BRAVO Derin Matthes 06/28/2024 5:05 AM

## 2024-06-28 NOTE — Progress Notes (Signed)
 Educated patient on MD recommendation for one unit of PRBC since hemoglobin 6.5. Patient states he is scared of receiving blood and asked about options. I notified MD patient has more questions regarding treatment options. Per elink nurse, MD having technical difficulties and can't camera in and will defer to dayshift since not actively bleeding in this moment.

## 2024-06-29 ENCOUNTER — Encounter (HOSPITAL_COMMUNITY): Payer: Self-pay | Admitting: Pulmonary Disease

## 2024-06-29 DIAGNOSIS — R6521 Severe sepsis with septic shock: Secondary | ICD-10-CM | POA: Diagnosis not present

## 2024-06-29 DIAGNOSIS — A419 Sepsis, unspecified organism: Secondary | ICD-10-CM | POA: Diagnosis not present

## 2024-06-29 LAB — CULTURE, BLOOD (ROUTINE X 2)

## 2024-06-29 LAB — TYPE AND SCREEN
ABO/RH(D): A POS
Antibody Screen: NEGATIVE
Unit division: 0

## 2024-06-29 LAB — GLUCOSE, CAPILLARY
Glucose-Capillary: 131 mg/dL — ABNORMAL HIGH (ref 70–99)
Glucose-Capillary: 77 mg/dL (ref 70–99)
Glucose-Capillary: 79 mg/dL (ref 70–99)
Glucose-Capillary: 80 mg/dL (ref 70–99)
Glucose-Capillary: 88 mg/dL (ref 70–99)
Glucose-Capillary: 99 mg/dL (ref 70–99)

## 2024-06-29 LAB — BPAM RBC
Blood Product Expiration Date: 202508042359
ISSUE DATE / TIME: 202507071259
Unit Type and Rh: 6200

## 2024-06-29 MED ORDER — ALBUTEROL SULFATE (2.5 MG/3ML) 0.083% IN NEBU
2.5000 mg | INHALATION_SOLUTION | RESPIRATORY_TRACT | Status: DC | PRN
  Administered 2024-06-30 (×2): 2.5 mg via RESPIRATORY_TRACT
  Filled 2024-06-29 (×2): qty 3

## 2024-06-29 MED ORDER — DILTIAZEM LOAD VIA INFUSION: 10.0000 mg | Freq: Once | INTRAVENOUS | Status: DC

## 2024-06-29 MED ORDER — ALBUTEROL SULFATE (2.5 MG/3ML) 0.083% IN NEBU
INHALATION_SOLUTION | RESPIRATORY_TRACT | Status: AC
  Administered 2024-06-29: 2.5 mg
  Filled 2024-06-29: qty 3

## 2024-06-29 MED ORDER — METOPROLOL TARTRATE 5 MG/5ML IV SOLN
5.0000 mg | INTRAVENOUS | Status: DC | PRN
  Administered 2024-06-29 – 2024-07-03 (×2): 5 mg via INTRAVENOUS
  Filled 2024-06-29 (×2): qty 5

## 2024-06-29 MED ORDER — DILTIAZEM HCL 25 MG/5ML IV SOLN
10.0000 mg | Freq: Once | INTRAVENOUS | Status: AC
  Administered 2024-06-29: 10 mg via INTRAVENOUS
  Filled 2024-06-29: qty 5

## 2024-06-29 NOTE — Plan of Care (Signed)

## 2024-06-29 NOTE — Progress Notes (Signed)
 PROGRESS NOTE    Phillip Logan  FMW:[id number] DOB: 10/08/1946 DOA: 06/26/2024 PCP: Jama Chow, MD   Brief Narrative:   78 years old male with past medical history of multiple comorbidities, admitted to the hospital for management of septic shock in the setting of multifocal pneumonia, transferred out of the ICU on 06/28/2024.  He is a resident of [facility] skilled nursing facility.  Assessment & Plan:  Principal Problem:   Septic shock (HCC)    Acute on chronic hypoxic respiratory failure, POA: Etiology appears to be possible bacterial multifocal pneumonia.  Patient is currently on broad-spectrum intravenous antibiotics. Finished course of zyvox . On IV Cefepime . He is afebrile.  Improving Leukocytosis.  Tested negative for urinary Legionella and pneumococcal antigens. MRSA PCR is negative Continue with 7 days of antibiotics.  Continue nasal supplementation to maintain oxygen saturation more than 90% Prn albuterol    Septic shock likely secondary to multifocal possible bacterial pneumonia, resolved.  Goal MAP of 65 and above.  Transferred out of the ICU on 06/28/2024. Blood cultures grew S.aureus.  S. Epidermidis is Methicillin resistant but likely contaminant.  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  It is at baseline.  Hyperglycemia, POA: Continue to monitor blood glucose level closely.  Acute kidney injury: likely ATN in the setting of septic shock, Improving with hydration.  Follow-up BMP.  Avoid nephrotoxic medications.  Acute on chronic normocytic anemia: Hemoglobin 6.5 on 06/28/2024 so patient received one unit of PRBC.  Hemoglobin after the transfusion is 8.0.  No evidence of active bleeding.  Likely secondary to multifactorial etiology including chronic illness and multiple medical problems.  Continue to monitor H&H closely and transfuse if necessary.  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   DVT prophylaxis: heparin  injection 5,000 Units Start: 06/26/24 1400     Code Status: Full Code Family Communication: None present at the bedside Status is: Inpatient Remains inpatient appropriate because: ARF, PNA   Subjective:  Patient was transferred from ICU yesterday.  He said that his shortness of breath is improved but he does have intermittent cough.  He told me that he lives in a skilled nursing facility manage follow-up.   Examination:  General exam: Appears calm and comfortable , nasal oxygen in place Respiratory system: Clear to auscultation. Respiratory effort normal. Cardiovascular system: S1 & S2 heard, RRR. No JVD, murmurs, rubs, gallops or clicks. No pedal edema. Gastrointestinal system: Abdomen is nondistended, soft and nontender. No organomegaly or masses felt. Normal bowel sounds heard. Central nervous system: Alert and oriented. No focal neurological deficits. Extremities: Symmetric 5 x 5 power. 1+ b/l LE Edema Skin: No rashes, lesions or ulcers Psychiatry: Judgement and insight appear normal. Mood & affect appropriate.       Diet Orders (From admission, onward)     Start     Ordered   06/26/24 1440  Diet regular Room service appropriate? Yes; Fluid consistency: Thin  Diet effective now       Question Answer Comment  Room service appropriate? Yes   Fluid consistency: Thin      06/26/24 1439            Objective: Vitals:   06/29/24 0031 06/29/24 0432 06/29/24 0500 06/29/24 0844  BP: (!) 165/85 (!) 170/95  (!) 168/96  Pulse: (!) 102 (!) 109  (!) 107  Resp: 18 18  20   Temp:  98 F (36.7 C) 97.8 F (36.6 C)  98.1 F (36.7 C)  TempSrc: Oral Oral    SpO2: 92% 91%  90%  Weight:   83.2 kg   Height:        Intake/Output Summary (Last 24 hours) at 06/29/2024 1046 Last data filed at 06/29/2024 0600 Gross per 24 hour  Intake 1134.31 ml   Output 2400 ml  Net -1265.69 ml   Filed Weights   06/27/24 0500 06/28/24 0358 06/29/24 0500  Weight: 83 kg 87.4 kg 83.2 kg    Scheduled Meds:  sodium chloride    Intravenous Once   sodium chloride    Intravenous Once   azithromycin   500 mg Oral Daily   Chlorhexidine  Gluconate Cloth  6 each Topical Daily   colchicine   0.6 mg Oral BID   gabapentin   400 mg Oral BID   Gerhardt's butt cream   Topical BID   heparin   5,000 Units Subcutaneous Q8H   insulin  aspart  0-9 Units Subcutaneous Q4H   nicotine   14 mg Transdermal Daily   sodium chloride  flush  10-40 mL Intracatheter Q12H   sodium chloride  flush  3 mL Intravenous Once   Continuous Infusions:  ceFEPime  (MAXIPIME ) IV Stopped (06/29/24 0846)    Nutritional status     Body mass index is 28.73 kg/m.  Data Reviewed:   CBC: Recent Labs  Lab 06/26/24 1036 06/26/24 1037 06/26/24 1407 06/27/24 0625 06/28/24 0410 06/28/24 2136  WBC 34.9*  --  32.0* 28.1* 14.3*  --   NEUTROABS 31.6*  --   --   --   --   --   HGB 9.1* 9.5* 7.2* 7.7* 6.5* 8.0*  HCT 28.9* 28.0* 22.9* 24.7* 21.2* 25.4*  MCV 92.6  --  92.0 91.8 91.0  --   PLT 272  --  230 253 193  --    Basic Metabolic Panel: Recent Labs  Lab 06/26/24 1036 06/26/24 1037 06/26/24 1407 06/26/24 1800 06/27/24 0625 06/27/24 2000 06/28/24 0410  NA 137 135  --  134* 133*  --  132*  K 4.2 4.4  --  4.1 4.0  --  4.0  CL 100 100  --  102 100  --  98  CO2 25  --   --  22 24  --  25  GLUCOSE 76 68*  --  105* 95  --  95  BUN 39* 43*  --  35* 32*  --  28*  CREATININE 2.15* 2.20* 1.85* 1.45* 1.33*  --  1.19  CALCIUM 8.0*  --   --  7.4* 8.0*  --  8.3*  MG  --   --   --   --  1.3* 2.6* 2.4  PHOS  --   --   --   --  3.5  --  2.2*   GFR: Estimated Creatinine Clearance: 53.6 mL/min (by C-G formula based on SCr of 1.19 mg/dL). Liver Function Tests: Recent Labs  Lab 06/26/24 1036  AST 24  ALT 18  ALKPHOS 67  BILITOT 0.9  PROT 5.6*  ALBUMIN  1.9*   No results for input(s):  LIPASE, AMYLASE in the last 168 hours. No results for input(s): AMMONIA in the last 168 hours. Coagulation Profile: Recent Labs  Lab 06/26/24 1036  INR 1.3*   Cardiac Enzymes: No results for input(s): CKTOTAL, CKMB, CKMBINDEX, TROPONINI in the last 168 hours. BNP (last 3 results) No results for input(s): PROBNP in the last 8760 hours. HbA1C: Recent Labs    06/26/24  1407  HGBA1C 5.3   CBG: Recent Labs  Lab 06/28/24 1523 06/28/24 2116 06/29/24 0027 06/29/24 0435 06/29/24 0919  GLUCAP 113* 116* 131* 99 80   Lipid Profile: No results for input(s): CHOL, HDL, LDLCALC, TRIG, CHOLHDL, LDLDIRECT in the last 72 hours. Thyroid  Function Tests: No results for input(s): TSH, T4TOTAL, FREET4, T3FREE, THYROIDAB in the last 72 hours. Anemia Panel: No results for input(s): VITAMINB12, FOLATE, FERRITIN, TIBC, IRON, RETICCTPCT in the last 72 hours. Sepsis Labs: Recent Labs  Lab 06/26/24 1042  LATICACIDVEN 1.5    Recent Results (from the past 240 hours)  Blood Culture (routine x 2)     Status: None (Preliminary result)   Collection Time: 06/26/24 10:36 AM   Specimen: BLOOD  Result Value Ref Range Status   Specimen Description BLOOD SITE NOT SPECIFIED  Final   Special Requests   Final    BOTTLES DRAWN AEROBIC AND ANAEROBIC Blood Culture results may not be optimal due to an inadequate volume of blood received in culture bottles   Culture   Final    NO GROWTH 3 DAYS Performed at [facility], [address], [place], [place] [address]    Report Status PENDING  Incomplete  Blood Culture (routine x 2)     Status: Abnormal   Collection Time: 06/26/24 10:41 AM   Specimen: BLOOD  Result Value Ref Range Status   Specimen Description BLOOD LEFT ANTECUBITAL  Final   Special Requests   Final    BOTTLES DRAWN AEROBIC AND ANAEROBIC Blood Culture results may not be optimal due to an inadequate volume of blood received in culture bottles    Culture  Setup Time   Final    GRAM POSITIVE COCCI IN BOTH AEROBIC AND ANAEROBIC BOTTLES CRITICAL RESULT CALLED TO, READ BACK BY AND VERIFIED WITH: PHARMD JENNY ZHOU [id number] AT 1405 BY EC    Culture (A)  Final    STAPHYLOCOCCUS EPIDERMIDIS THE SIGNIFICANCE OF ISOLATING THIS ORGANISM FROM A SINGLE SET OF BLOOD CULTURES WHEN MULTIPLE SETS ARE DRAWN IS UNCERTAIN. PLEASE NOTIFY THE MICROBIOLOGY DEPARTMENT WITHIN ONE WEEK IF SPECIATION AND SENSITIVITIES ARE REQUIRED. Performed at [facility], [address], [place], [place] [address]    Report Status 06/29/2024 FINAL  Final  Blood Culture ID Panel (Reflexed)     Status: Abnormal   Collection Time: 06/26/24 10:41 AM  Result Value Ref Range Status   Enterococcus faecalis NOT DETECTED NOT DETECTED Final   Enterococcus Faecium NOT DETECTED NOT DETECTED Final   Listeria monocytogenes NOT DETECTED NOT DETECTED Final   Staphylococcus species DETECTED (A) NOT DETECTED Final    Comment: CRITICAL RESULT CALLED TO, READ BACK BY AND VERIFIED WITH: PHARMD JENNY ZHOU [id number] AT 1405 BY EC    Staphylococcus aureus (BCID) NOT DETECTED NOT DETECTED Final   Staphylococcus epidermidis DETECTED (A) NOT DETECTED Final    Comment: Methicillin (oxacillin) resistant coagulase negative staphylococcus. Possible blood culture contaminant (unless isolated from more than one blood culture draw or clinical case suggests pathogenicity). No antibiotic treatment is indicated for blood  culture contaminants. CRITICAL RESULT CALLED TO, READ BACK BY AND VERIFIED WITH: PHARMD JENNY ZHOU [phone] AT 1405 BY EC    Staphylococcus lugdunensis NOT DETECTED NOT DETECTED Final   Streptococcus species NOT DETECTED NOT DETECTED Final   Streptococcus agalactiae NOT DETECTED NOT DETECTED Final   Streptococcus pneumoniae NOT DETECTED NOT DETECTED Final   Streptococcus pyogenes NOT DETECTED NOT DETECTED Final   A.calcoaceticus-baumannii NOT DETECTED NOT  DETECTED Final    Bacteroides fragilis NOT DETECTED NOT DETECTED Final   Enterobacterales NOT DETECTED NOT DETECTED Final   Enterobacter cloacae complex NOT DETECTED NOT DETECTED Final   Escherichia coli NOT DETECTED NOT DETECTED Final   Klebsiella aerogenes NOT DETECTED NOT DETECTED Final   Klebsiella oxytoca NOT DETECTED NOT DETECTED Final   Klebsiella pneumoniae NOT DETECTED NOT DETECTED Final   Proteus species NOT DETECTED NOT DETECTED Final   Salmonella species NOT DETECTED NOT DETECTED Final   Serratia marcescens NOT DETECTED NOT DETECTED Final   Haemophilus influenzae NOT DETECTED NOT DETECTED Final   Neisseria meningitidis NOT DETECTED NOT DETECTED Final   Pseudomonas aeruginosa NOT DETECTED NOT DETECTED Final   Stenotrophomonas maltophilia NOT DETECTED NOT DETECTED Final   Candida albicans NOT DETECTED NOT DETECTED Final   Candida auris NOT DETECTED NOT DETECTED Final   Candida glabrata NOT DETECTED NOT DETECTED Final   Candida krusei NOT DETECTED NOT DETECTED Final   Candida parapsilosis NOT DETECTED NOT DETECTED Final   Candida tropicalis NOT DETECTED NOT DETECTED Final   Cryptococcus neoformans/gattii NOT DETECTED NOT DETECTED Final   Methicillin resistance mecA/C DETECTED (A) NOT DETECTED Final    Comment: CRITICAL RESULT CALLED TO, READ BACK BY AND VERIFIED WITH: MAYA RANDALL CALL [phone] AT 1405 BY EC Performed at [facility], [address], [place], [place] [address]   MRSA Next Gen by PCR, Nasal     Status: None   Collection Time: 06/26/24 12:19 PM   Specimen: Nasal Mucosa; Nasal Swab  Result Value Ref Range Status   MRSA by PCR Next Gen NOT DETECTED NOT DETECTED Final    Comment: (NOTE) The GeneXpert MRSA Assay (FDA approved for NASAL specimens only), is one component of a comprehensive MRSA colonization surveillance program. It is not intended to diagnose MRSA infection nor to guide or monitor treatment for MRSA infections. Test performance is not FDA approved in  patients less than 58 years old. Performed at [facility], [address], [place], [place] [address]   Respiratory (~20 pathogens) panel by PCR     Status: None   Collection Time: 06/26/24 12:56 PM   Specimen: Nasopharyngeal Swab; Respiratory  Result Value Ref Range Status   Adenovirus NOT DETECTED NOT DETECTED Final   Coronavirus 229E NOT DETECTED NOT DETECTED Final    Comment: (NOTE) The Coronavirus on the Respiratory Panel, DOES NOT test for the novel  Coronavirus (2019 nCoV)    Coronavirus HKU1 NOT DETECTED NOT DETECTED Final   Coronavirus NL63 NOT DETECTED NOT DETECTED Final   Coronavirus OC43 NOT DETECTED NOT DETECTED Final   Metapneumovirus NOT DETECTED NOT DETECTED Final   Rhinovirus / Enterovirus NOT DETECTED NOT DETECTED Final   Influenza A NOT DETECTED NOT DETECTED Final   Influenza B NOT DETECTED NOT DETECTED Final   Parainfluenza Virus 1 NOT DETECTED NOT DETECTED Final   Parainfluenza Virus 2 NOT DETECTED NOT DETECTED Final   Parainfluenza Virus 3 NOT DETECTED NOT DETECTED Final   Parainfluenza Virus 4 NOT DETECTED NOT DETECTED Final   Respiratory Syncytial Virus NOT DETECTED NOT DETECTED Final   Bordetella pertussis NOT DETECTED NOT DETECTED Final   Bordetella Parapertussis NOT DETECTED NOT DETECTED Final   Chlamydophila pneumoniae NOT DETECTED NOT DETECTED Final   Mycoplasma pneumoniae NOT DETECTED NOT DETECTED Final    Comment: Performed at [facility], [address], [place], [place] [address]         Radiology Studies:  No results found.         LOS: 3 days   Time spent= 35 mins    Deliliah Room, MD [facility]  If 7PM-7AM, please contact night-coverage  06/29/2024, 10:46 AM

## 2024-06-29 NOTE — Plan of Care (Signed)
  Problem: Education: Goal: Ability to describe self-care measures that may prevent or decrease complications (Diabetes Survival Skills Education) will improve Outcome: Progressing Goal: Individualized Educational Video(s) Outcome: Progressing   Problem: Skin Integrity: Goal: Risk for impaired skin integrity will decrease Outcome: Progressing   Problem: Safety: Goal: Ability to remain free from injury will improve Outcome: Progressing

## 2024-06-30 DIAGNOSIS — N179 Acute kidney failure, unspecified: Secondary | ICD-10-CM | POA: Diagnosis not present

## 2024-06-30 DIAGNOSIS — C44329 Squamous cell carcinoma of skin of other parts of face: Secondary | ICD-10-CM

## 2024-06-30 DIAGNOSIS — A419 Sepsis, unspecified organism: Secondary | ICD-10-CM | POA: Diagnosis not present

## 2024-06-30 DIAGNOSIS — J9601 Acute respiratory failure with hypoxia: Secondary | ICD-10-CM | POA: Diagnosis not present

## 2024-06-30 DIAGNOSIS — J189 Pneumonia, unspecified organism: Secondary | ICD-10-CM | POA: Diagnosis not present

## 2024-06-30 LAB — CBC WITH DIFFERENTIAL/PLATELET
Abs Immature Granulocytes: 0.22 K/uL — ABNORMAL HIGH (ref 0.00–0.07)
Basophils Absolute: 0 K/uL (ref 0.0–0.1)
Basophils Relative: 0 %
Eosinophils Absolute: 0.1 K/uL (ref 0.0–0.5)
Eosinophils Relative: 1 %
HCT: 33.2 % — ABNORMAL LOW (ref 39.0–52.0)
Hemoglobin: 10.6 g/dL — ABNORMAL LOW (ref 13.0–17.0)
Immature Granulocytes: 1 %
Lymphocytes Relative: 4 %
Lymphs Abs: 0.7 K/uL (ref 0.7–4.0)
MCH: 28.4 pg (ref 26.0–34.0)
MCHC: 31.9 g/dL (ref 30.0–36.0)
MCV: 89 fL (ref 80.0–100.0)
Monocytes Absolute: 1.3 K/uL — ABNORMAL HIGH (ref 0.1–1.0)
Monocytes Relative: 8 %
Neutro Abs: 13.3 K/uL — ABNORMAL HIGH (ref 1.7–7.7)
Neutrophils Relative %: 86 %
Platelets: 241 K/uL (ref 150–400)
RBC: 3.73 MIL/uL — ABNORMAL LOW (ref 4.22–5.81)
RDW: 15.2 % (ref 11.5–15.5)
WBC: 15.5 K/uL — ABNORMAL HIGH (ref 4.0–10.5)
nRBC: 0 % (ref 0.0–0.2)

## 2024-06-30 LAB — BASIC METABOLIC PANEL WITH GFR
Anion gap: 14 (ref 5–15)
BUN: 16 mg/dL (ref 8–23)
CO2: 28 mmol/L (ref 22–32)
Calcium: 8.5 mg/dL — ABNORMAL LOW (ref 8.9–10.3)
Chloride: 94 mmol/L — ABNORMAL LOW (ref 98–111)
Creatinine, Ser: 0.99 mg/dL (ref 0.61–1.24)
GFR, Estimated: >60 mL/min (ref >60)
Glucose, Bld: 68 mg/dL — ABNORMAL LOW (ref 70–99)
Potassium: 3.1 mmol/L — ABNORMAL LOW (ref 3.5–5.1)
Sodium: 136 mmol/L (ref 135–145)

## 2024-06-30 LAB — GLUCOSE, CAPILLARY
Glucose-Capillary: 104 mg/dL — ABNORMAL HIGH (ref 70–99)
Glucose-Capillary: 146 mg/dL — ABNORMAL HIGH (ref 70–99)
Glucose-Capillary: 70 mg/dL (ref 70–99)
Glucose-Capillary: 75 mg/dL (ref 70–99)
Glucose-Capillary: 88 mg/dL (ref 70–99)
Glucose-Capillary: 96 mg/dL (ref 70–99)

## 2024-06-30 MED ORDER — SODIUM CHLORIDE 0.9 % IV SOLN
2.0000 g | Freq: Three times a day (TID) | INTRAVENOUS | Status: AC
  Administered 2024-06-30 – 2024-07-02 (×6): 2 g via INTRAVENOUS
  Filled 2024-06-30 (×6): qty 12.5

## 2024-06-30 MED ORDER — KETOROLAC TROMETHAMINE 15 MG/ML IJ SOLN
15.0000 mg | Freq: Once | INTRAMUSCULAR | Status: AC
  Administered 2024-06-30: 15 mg via INTRAVENOUS
  Filled 2024-06-30: qty 1

## 2024-06-30 MED ORDER — GUAIFENESIN ER 600 MG PO TB12
600.0000 mg | ORAL_TABLET | Freq: Two times a day (BID) | ORAL | Status: DC | PRN
  Administered 2024-06-30 – 2024-07-01 (×2): 600 mg via ORAL
  Filled 2024-06-30 (×2): qty 1

## 2024-06-30 NOTE — NC FL2 (Signed)
 [place]  MEDICAID FL2 LEVEL OF CARE FORM     IDENTIFICATION  Patient Name: Phillip Logan Birthdate: 04-Apr-1946 Sex: male Admission Date (Current Location): 06/26/2024  [facility] and [place] Number:  [employer] and Address:  [facility], [address], [place], [place] [address]      Provider Number: [id number]  Attending Physician Name and Address:  Arlon Carliss ORN, DO  Relative Name and Phone Number:       Current Level of Care: Hospital Recommended Level of Care: Skilled Nursing Facility Prior Approval Number:    Date Approved/Denied:   PASRR Number: [id number] E expires 07/08/2024  Discharge Plan: SNF    Current Diagnoses: Patient Active Problem List   Diagnosis Date Noted   Acute hypoxic respiratory failure (HCC) 06/30/2024   Multifocal pneumonia 06/30/2024   AKI (acute kidney injury) (HCC) 06/30/2024   Septic shock (HCC) 06/26/2024   Iron deficiency anemia 01/28/2022   Furuncle of scalp 10/01/2021   Hypomagnesemia 10/05/2020   Cellulitis 10/02/2020   Squamous cell carcinoma of skin of other parts of face 09/10/2020    Orientation RESPIRATION BLADDER Height & Weight     Self, Place, Situation  Normal Incontinent, External catheter Weight: 183 lb 6.8 oz (83.2 kg) Height:  5' 7 (170.2 cm)  BEHAVIORAL SYMPTOMS/MOOD NEUROLOGICAL BOWEL NUTRITION STATUS      Incontinent Diet (See dc summary)  AMBULATORY STATUS COMMUNICATION OF NEEDS Skin   Extensive Assist Verbally Other (Comment) (injury on coccyx and back)                       Personal Care Assistance Level of Assistance  Bathing, Feeding, Dressing Bathing Assistance: Maximum assistance Feeding assistance: Limited assistance Dressing Assistance: Maximum assistance     Functional Limitations Info  Sight, Hearing Sight Info: Impaired Hearing Info: Impaired      SPECIAL CARE FACTORS FREQUENCY  PT (By licensed PT), OT (By licensed OT)     PT  Frequency: 5x/week OT Frequency: 5x/week            Contractures Contractures Info: Not present    Additional Factors Info  Code Status, Allergies, Insulin  Sliding Scale Code Status Info: Full Allergies Info: Ciprofloxacin, Clindamycin/lincomycin, Clonidine Derivatives, Cyproheptadine, Lisinopril, Myrbetriq (Mirabegron), Penicillins, Streptokinases   Insulin  Sliding Scale Info: See dc summary       Current Medications (06/30/2024):  This is the current hospital active medication list Current Facility-Administered Medications  Medication Dose Route Frequency Provider Last Rate Last Admin   0.9 %  sodium chloride  infusion (Manually program via Guardrails IV Fluids)   Intravenous Once Kamat, Sunil G, MD   Stopped at 06/29/24 1049   0.9 %  sodium chloride  infusion (Manually program via Guardrails IV Fluids)   Intravenous Once Olalere, Adewale A, MD   Stopped at 06/29/24 1049   acetaminophen  (TYLENOL ) tablet 650 mg  650 mg Oral Q4H PRN Hunsucker, Donnice SAUNDERS, MD   650 mg at 06/27/24 1956   albuterol  (PROVENTIL ) (2.5 MG/3ML) 0.083% nebulizer solution 2.5 mg  2.5 mg Nebulization Q4H PRN Rashid, Farhan, MD   2.5 mg at 06/30/24 1028   ceFEPIme  (MAXIPIME ) 2 g in sodium chloride  0.9 % 100 mL IVPB  2 g Intravenous Q8H Gretta Levorn SQUIBB, RPH       Chlorhexidine  Gluconate Cloth 2 % PADS 6 each  6 each Topical Daily Hunsucker, Donnice SAUNDERS, MD   6 each at 06/30/24 1220   colchicine  tablet 0.6 mg  0.6 mg Oral BID Kara Carrier B, MD   0.6 mg at 06/30/24 9060   docusate sodium  (COLACE) capsule 100 mg  100 mg Oral BID PRN Hunsucker, Donnice SAUNDERS, MD       gabapentin  (NEURONTIN ) capsule 400 mg  400 mg Oral BID Kara Carrier B, MD   400 mg at 06/30/24 9060   Gerhardt's butt cream   Topical BID Hunsucker, Donnice SAUNDERS, MD   Given at 06/30/24 1220   guaiFENesin  (MUCINEX ) 12 hr tablet 600 mg  600 mg Oral BID PRN Arlon Carliss ORN, DO   600 mg at 06/30/24 1310   heparin  injection 5,000 Units  5,000 Units Subcutaneous  Q8H Hunsucker, Donnice SAUNDERS, MD   5,000 Units at 06/30/24 1310   insulin  aspart (novoLOG ) injection 0-9 Units  0-9 Units Subcutaneous Q4H Hunsucker, Donnice SAUNDERS, MD   1 Units at 06/30/24 1310   metoprolol  tartrate (LOPRESSOR ) injection 5 mg  5 mg Intravenous Q4H PRN Rashid, Farhan, MD   5 mg at 06/29/24 1705   nicotine  (NICODERM CQ  - dosed in mg/24 hours) patch 14 mg  14 mg Transdermal Daily Hunsucker, Donnice SAUNDERS, MD   14 mg at 06/29/24 2106   ondansetron  (ZOFRAN ) injection 4 mg  4 mg Intravenous Q6H PRN Hunsucker, Donnice SAUNDERS, MD   4 mg at 06/28/24 1520   Oral care mouth rinse  15 mL Mouth Rinse PRN Hunsucker, Donnice SAUNDERS, MD   15 mL at 06/27/24 0755   oxyCODONE  (Oxy IR/ROXICODONE ) immediate release tablet 5 mg  5 mg Oral Q6H PRN Hunsucker, Donnice SAUNDERS, MD   5 mg at 06/30/24 1120   polyethylene glycol (MIRALAX  / GLYCOLAX ) packet 17 g  17 g Oral Daily PRN Hunsucker, Donnice SAUNDERS, MD       sodium chloride  flush (NS) 0.9 % injection 10-40 mL  10-40 mL Intracatheter Q12H Hunsucker, Donnice SAUNDERS, MD   10 mL at 06/29/24 2131   sodium chloride  flush (NS) 0.9 % injection 10-40 mL  10-40 mL Intracatheter PRN Hunsucker, Donnice SAUNDERS, MD       sodium chloride  flush (NS) 0.9 % injection 3 mL  3 mL Intravenous Once Trifan, Donnice PARAS, MD         Discharge Medications: Please see discharge summary for a list of discharge medications.  Relevant Imaging Results:  Relevant Lab Results:   Additional Information SSN : [id number]  Devory Mckinzie S Alvy Alsop, LCSW

## 2024-06-30 NOTE — Evaluation (Signed)
 Physical Therapy Evaluation Patient Details Name: Phillip Logan MRN: [id number] DOB: March 09, 1946 Today's Date: 06/30/2024  History of Present Illness  Pt is a 78 y/o M presenting to ED on 7/5 from [facility] as code stroke with slurred speech and R facial droop, code stroke cancelled. Found to be hypoxemic with bil infiltrates concerning for PNA and hypotension. CT head negative. Admitted for septic shock in the setting of multifocal PNA. PMH includes COPD, HTN, clotting disorder, R facial reconstruction surgery   Clinical Impression  Pt in bed upon arrival and agreeable to PT eval. Pt from [facility] SNF and reported ambulating hallway distances with RW. Limited PT eval due to increased pain in hands and pt reporting just not feeling good. Pt declined OOB mobility, but was agreeable to move into chair position with TotalA. Pt was agreeable to keep working with therapy to maintain strength and mobility while in the hospital. At this time, recommending return to <3hrs post acute rehab to work towards independence with mobility. Pt would benefit from acute skilled PT with current functional limitations listed below (see PT Problem List). Acute PT to follow.  87% SpO2 on RA >90% SpO2 on 2L         If plan is discharge home, recommend the following: A lot of help with walking and/or transfers;A lot of help with bathing/dressing/bathroom;Assistance with cooking/housework;Assist for transportation;Help with stairs or ramp for entrance   Can travel by private vehicle   No    Equipment Recommendations None recommended by PT     Functional Status Assessment Patient has had a recent decline in their functional status and demonstrates the ability to make significant improvements in function in a reasonable and predictable amount of time.     Precautions / Restrictions Precautions Precautions: Fall Precaution/Restrictions Comments: watch O2 Restrictions Weight Bearing Restrictions Per  Provider Order: No      Mobility  Bed Mobility Overal bed mobility: Needs Assistance Bed Mobility: Supine to Sit    Supine to sit: Total assist    General bed mobility comments: Pt declined moving OOB, using chair position to move into seated position for lunch, TotalA    Transfers    General transfer comment: pt declines        Pertinent Vitals/Pain Pain Assessment Pain Assessment: Faces Faces Pain Scale: Hurts worst Pain Location: bil hands Pain Descriptors / Indicators: Discomfort Pain Intervention(s): Limited activity within patient's tolerance, Monitored during session, Repositioned    Home Living Family/patient expects to be discharged to:: Skilled nursing facility    Additional Comments: from [facility] SNF    Prior Function Prior Level of Function : Needs assist    Mobility Comments: RW at all times, amb hall distances ADLs Comments: some assist for ADLs     Extremity/Trunk Assessment   Upper Extremity Assessment Upper Extremity Assessment: Defer to OT evaluation    Lower Extremity Assessment Lower Extremity Assessment: Generalized weakness       Communication   Communication Communication: No apparent difficulties    Cognition Arousal: Alert Behavior During Therapy: WFL for tasks assessed/performed   PT - Cognitive impairments: No family/caregiver present to determine baseline    Following commands: Intact       Cueing Cueing Techniques: Verbal cues     General Comments General comments (skin integrity, edema, etc.): SpO2 down to 87% on RA at rest, 2L o2 reapplied at end of session     PT Assessment Patient needs continued PT services  PT Problem List Decreased strength;Decreased  activity tolerance;Decreased balance;Decreased mobility;Decreased knowledge of use of DME;Decreased safety awareness       PT Treatment Interventions DME instruction;Gait training;Functional mobility training;Therapeutic activities;Therapeutic  exercise;Balance training;Neuromuscular re-education;Patient/family education    PT Goals (Current goals can be found in the Care Plan section)  Acute Rehab PT Goals Patient Stated Goal: to feel better PT Goal Formulation: With patient Time For Goal Achievement: 07/14/24 Potential to Achieve Goals: Good    Frequency Min 2X/week     Co-evaluation   Reason for Co-Treatment: Complexity of the patient's impairments (multi-system involvement);For patient/therapist safety;Necessary to address cognition/behavior during functional activity PT goals addressed during session: Mobility/safety with mobility         AM-PAC PT 6 Clicks Mobility  Outcome Measure Help needed turning from your back to your side while in a flat bed without using bedrails?: Total Help needed moving from lying on your back to sitting on the side of a flat bed without using bedrails?: Total Help needed moving to and from a bed to a chair (including a wheelchair)?: Total Help needed standing up from a chair using your arms (e.g., wheelchair or bedside chair)?: Total Help needed to walk in hospital room?: Total Help needed climbing 3-5 steps with a railing? : Total 6 Click Score: 6    End of Session Equipment Utilized During Treatment: Oxygen Activity Tolerance: Patient limited by pain Patient left: in bed;with call bell/phone within reach;with bed alarm set Nurse Communication: Mobility status PT Visit Diagnosis: Other abnormalities of gait and mobility (R26.89);Muscle weakness (generalized) (M62.81)    Time: 8854-8845 PT Time Calculation (min) (ACUTE ONLY): 9 min   Charges:   PT Evaluation $PT Eval Low Complexity: 1 Low   PT General Charges $$ ACUTE PT VISIT: 1 Visit       Kate ORN, PT, DPT Secure Chat Preferred  Rehab Office [phone]   Kate BRAVO Wendolyn 06/30/2024, 12:44 PM

## 2024-06-30 NOTE — Plan of Care (Signed)

## 2024-06-30 NOTE — Progress Notes (Signed)
 Progress Note   Patient: Phillip Logan FMW:[id number] DOB: [birth year] DOA: 06/26/2024  DOS: the patient was seen and examined on 06/30/2024   Brief hospital course:  78 years old male with past medical history of multiple comorbidities, admitted to the hospital for management of septic shock in the setting of multifocal pneumonia, transferred out of the ICU on 06/28/2024.  He is a resident of [place] skilled nursing facility.   Assessment and Plan:  Acute on chronic hypoxic respiratory failure - Currently on 2.5 L nasal cannula.  Patient states that he normally does not wear nasal cannula however unclear if this is true.  Possible that patient may need to be discharged with home O2.  Septic shock - Shock resolved, transition out of ICU 7/7.  Multifocal pneumonia - Currently on broad-spectrum antibiotic coverage.  Showing mild improvement.  Nebulizers, wean O2 as above.  Strep pneumo/Legionella negative.  Respiratory pathogen profile negative.  Leukocytosis improving.  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  Patient at baseline.  Acute kidney injury - Likely ATN in the setting of septic shock.  Showing improvement with IV fluid hydration.  Recheck BMP in AM.  Acute on chronic normocytic anemia - Status post 1 unit PRBC 7/7.  Globin stable.  No active bleeding appreciated.  Likely anemia of chronic disease/inflammation.  Will recheck CBC in AM.  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  Goals of care - Working closely with TOC on disposition back to [facility].  Pending insurance authorization.  Anticipate discharge later this week.    Subjective: Patient resting comfortable this morning.  Continues to complain of feeling weak.  Coughing up some mucus intermittently.  Currently on 2.5 L nasal cannula.  Denies any worsening fever, purulent sputum, nausea, vomiting, abdominal  pain.  Physical Exam:  Vitals:   06/29/24 2352 06/30/24 0415 06/30/24 0729 06/30/24 1028  BP: (!) 156/93 (!) 155/95 (!) 169/97   Pulse: (!) 108 (!) 102 (!) 101 (!) 102  Resp: 17 17 18 20   Temp: 98 F (36.7 C) 98.3 F (36.8 C) 98.1 F (36.7 C)   TempSrc:  Oral    SpO2: 93% 97% 94%   Weight:      Height:        GENERAL:  Alert, pleasant, no acute distress, frail HEENT:  EOMI, dysarthria CARDIOVASCULAR:  RRR, no murmurs appreciated RESPIRATORY:  Clear to auscultation, no wheezing, rales, or rhonchi GASTROINTESTINAL:  Soft, nontender, nondistended EXTREMITIES:  No LE edema bilaterally NEURO:  No new focal deficits appreciated SKIN:  No rashes noted PSYCH:  Appropriate mood and affect     Data Reviewed:  No new imaging to review  Previous records (including but not limited to H&P, progress notes, nursing notes, TOC management) were reviewed in assessment of this patient.  Labs: CBC: Recent Labs  Lab 06/26/24 1036 06/26/24 1037 06/26/24 1407 06/27/24 0625 06/28/24 0410 06/28/24 2136 06/30/24 0744  WBC 34.9*  --  32.0* 28.1* 14.3*  --  15.5*  NEUTROABS 31.6*  --   --   --   --   --  13.3*  HGB 9.1*   < > 7.2* 7.7* 6.5* 8.0* 10.6*  HCT 28.9*   < > 22.9* 24.7* 21.2* 25.4* 33.2*  MCV 92.6  --  92.0 91.8 91.0  --  89.0  PLT 272  --  230 253 193  --  241   < > = values in this interval  not displayed.   Basic Metabolic Panel: Recent Labs  Lab 06/26/24 1036 06/26/24 1037 06/26/24 1407 06/26/24 1800 06/27/24 0625 06/27/24 2000 06/28/24 0410 06/30/24 0744  NA 137 135  --  134* 133*  --  132* 136  K 4.2 4.4  --  4.1 4.0  --  4.0 3.1*  CL 100 100  --  102 100  --  98 94*  CO2 25  --   --  22 24  --  25 28  GLUCOSE 76 68*  --  105* 95  --  95 68*  BUN 39* 43*  --  35* 32*  --  28* 16  CREATININE 2.15* 2.20* 1.85* 1.45* 1.33*  --  1.19 0.99  CALCIUM 8.0*  --   --  7.4* 8.0*  --  8.3* 8.5*  MG  --   --   --   --  1.3* 2.6* 2.4  --   PHOS  --   --   --   --  3.5   --  2.2*  --    Liver Function Tests: Recent Labs  Lab 06/26/24 1036  AST 24  ALT 18  ALKPHOS 67  BILITOT 0.9  PROT 5.6*  ALBUMIN  1.9*   CBG: Recent Labs  Lab 06/29/24 1140 06/29/24 1240 06/29/24 2353 06/30/24 0418 06/30/24 0727  GLUCAP 88 79 77 70 75    Scheduled Meds:  sodium chloride    Intravenous Once   sodium chloride    Intravenous Once   Chlorhexidine  Gluconate Cloth  6 each Topical Daily   colchicine   0.6 mg Oral BID   gabapentin   400 mg Oral BID   Gerhardt's butt cream   Topical BID   heparin   5,000 Units Subcutaneous Q8H   insulin  aspart  0-9 Units Subcutaneous Q4H   nicotine   14 mg Transdermal Daily   sodium chloride  flush  10-40 mL Intracatheter Q12H   sodium chloride  flush  3 mL Intravenous Once   Continuous Infusions:  ceFEPime  (MAXIPIME ) IV 2 g (06/30/24 1124)   PRN Meds:.acetaminophen , albuterol , docusate sodium , guaiFENesin , metoprolol  tartrate, ondansetron  (ZOFRAN ) IV, mouth rinse, oxyCODONE , polyethylene glycol, sodium chloride  flush  Family Communication: None at bedside  Disposition: Status is: Inpatient Remains inpatient appropriate because: Multifocal pneumonia     Time spent: 38 minutes  Length of inpatient stay: 4 days  Author: Carliss LELON Canales, DO 06/30/2024 11:31 AM  For on call review [email or web address].

## 2024-06-30 NOTE — Evaluation (Signed)
 Occupational Therapy Evaluation Patient Details Name: Phillip Logan MRN: [id number] DOB: Sep 19, 1946 Today's Date: 06/30/2024   History of Present Illness   Pt is a 78 y/o M presenting to ED on 7/5 from [facility] as code stroke with slurred speech and R facial droop, code stroke cancelled. Found to be hypoxemic with bil infiltrates concerning for PNA and hypotension. CT head negative. Admitted for septic shock in the setting of multifocal PNA. PMH includes COPD, HTN, clotting disorder, R facial reconstruction surgery     Clinical Impressions Pt from SNF, reports ambulating hallway distances with RW and has assist for ADLs at baseline. Pt currently with bil hand pain and needing min-mod A for ADLs, pt declines EOB/OOB mobility at this time but tolerates bed chair position to set up for lunch. Pt desatting to 87% at rest on RA, 2L O2 reapplied. Pt presenting with impairments listed below, will follow acutely. Patient will benefit from continued inpatient follow up therapy, <3 hours/day to maximize safety/ind with ADL/functional mobility.      If plan is discharge home, recommend the following:   A lot of help with walking and/or transfers;A lot of help with bathing/dressing/bathroom;Direct supervision/assist for medications management;Direct supervision/assist for financial management;Assist for transportation;Help with stairs or ramp for entrance;Supervision due to cognitive status     Functional Status Assessment   Patient has had a recent decline in their functional status and demonstrates the ability to make significant improvements in function in a reasonable and predictable amount of time.     Equipment Recommendations   Other (comment) (defer)     Recommendations for Other Services   PT consult     Precautions/Restrictions   Precautions Precautions: Fall Precaution/Restrictions Comments: watch O2 Restrictions Weight Bearing Restrictions Per Provider  Order: No     Mobility Bed Mobility               General bed mobility comments: pt declines    Transfers                   General transfer comment: pt declines      Balance                                           ADL either performed or assessed with clinical judgement   ADL Overall ADL's : Needs assistance/impaired Eating/Feeding: Set up;Sitting;Bed level   Grooming: Set up;Sitting;Bed level   Upper Body Bathing: Minimal assistance;Bed level;Sitting   Lower Body Bathing: Moderate assistance;Bed level;Sitting/lateral leans   Upper Body Dressing : Minimal assistance;Bed level;Sitting   Lower Body Dressing: Moderate assistance;Sitting/lateral leans;Bed level                       Vision   Additional Comments: not formally assessed     Perception Perception: Not tested       Praxis Praxis: Not tested       Pertinent Vitals/Pain Pain Assessment Pain Assessment: Faces Pain Score: 10-Worst pain ever Faces Pain Scale: Hurts worst Pain Location: bil hands Pain Descriptors / Indicators: Discomfort Pain Intervention(s): Limited activity within patient's tolerance, Monitored during session, Repositioned     Extremity/Trunk Assessment Upper Extremity Assessment Upper Extremity Assessment: Generalized weakness;Right hand dominant (reports numbness/tingling/pain in hands)   Lower Extremity Assessment Lower Extremity Assessment: Defer to PT evaluation       Communication Communication Communication:  No apparent difficulties   Cognition Arousal: Alert Behavior During Therapy: WFL for tasks assessed/performed Cognition: No family/caregiver present to determine baseline             OT - Cognition Comments: provides PLOF, follows basic commands                 Following commands: Intact       Cueing  General Comments   Cueing Techniques: Verbal cues  SpO2 down to 87% on RA at rest, 2L o2 reapplied  at end of session   Exercises     Shoulder Instructions      Home Living Family/patient expects to be discharged to:: Skilled nursing facility                                 Additional Comments: from [facility]      Prior Functioning/Environment               Mobility Comments: RW at all times, amb hall distances ADLs Comments: some assist for ADLs    OT Problem List: Decreased strength;Decreased range of motion;Decreased activity tolerance;Impaired balance (sitting and/or standing)   OT Treatment/Interventions: Self-care/ADL training;Therapeutic exercise;Energy conservation;DME and/or AE instruction;Therapeutic activities;Patient/family education;Balance training      OT Goals(Current goals can be found in the care plan section)   Acute Rehab OT Goals Patient Stated Goal: none stated OT Goal Formulation: With patient Time For Goal Achievement: 07/14/24 Potential to Achieve Goals: Good ADL Goals Pt Will Perform Upper Body Dressing: with contact guard assist;sitting Pt Will Perform Lower Body Dressing: with contact guard assist;sit to/from stand;sitting/lateral leans Pt Will Transfer to Toilet: with contact guard assist;ambulating;regular height toilet   OT Frequency:  Min 2X/week    Co-evaluation              AM-PAC OT 6 Clicks Daily Activity     Outcome Measure Help from another person eating meals?: A Little Help from another person taking care of personal grooming?: A Little Help from another person toileting, which includes using toliet, bedpan, or urinal?: A Little Help from another person bathing (including washing, rinsing, drying)?: A Lot Help from another person to put on and taking off regular upper body clothing?: A Lot Help from another person to put on and taking off regular lower body clothing?: A Lot 6 Click Score: 15   End of Session Equipment Utilized During Treatment: Gait belt Nurse Communication: Mobility  status  Activity Tolerance: Patient tolerated treatment well Patient left: in bed;with call bell/phone within reach;with bed alarm set  OT Visit Diagnosis: Unsteadiness on feet (R26.81);Other abnormalities of gait and mobility (R26.89);Muscle weakness (generalized) (M62.81)                Time: 8860-8844 OT Time Calculation (min): 16 min Charges:  OT General Charges $OT Visit: 1 Visit OT Evaluation $OT Eval Low Complexity: 1 Low  Camilo Mander K, OTD, OTR/L SecureChat Preferred Acute Rehab [phone]   Risa Auman K Koonce 06/30/2024, 12:15 PM

## 2024-06-30 NOTE — Progress Notes (Signed)
 Pharmacy Antibiotic Note  Phillip Logan is a 78 y.o. male for which pharmacy was consulted on 06/26/24 for cefepime  dosing for pneumonia.  Patient with a history of squamous cell carcinoma, COPD, HTN, Clotting disordered, cervical fusion. Patient presenting as code stroke (AMS, hypoxia, garbled speech).   Septic shock resolved.  Multifocal pneumonia. Currently on broad spectrum antibiotic, Cefepime .  Leukocytosis improved, WBC down to 15.5k Afebrile.   Microbiology results: 7/5 MRSA: negative 7/5 Blood cx: 1 of 4 MRSE 7/5 Resp: negative  AKI:  SCr has improved to 0.99 with IVF hydration, CrCl 64 ml/min.  Will need to adjust Cefepime  dose to every 8 hour interval for CrCl >60 ml/min.   Plan: Adjust Cefepime  dose to 2g IV q8hr.  Plan for total of 7 days of Cefepime  as ID pharmacist discussed with Dr. Arlon, thus to continue thru 07/02/24, then stop.  Monitor WBC, fever, renal function, cultures  Height: 5' 7 (170.2 cm) Weight: 83.2 kg (183 lb 6.8 oz) IBW/kg (Calculated) : 66.1  Temp (24hrs), Avg:98.1 F (36.7 C), Min:97.6 F (36.4 C), Max:98.6 F (37 C)  Recent Labs  Lab 06/26/24 1036 06/26/24 1037 06/26/24 1042 06/26/24 1407 06/26/24 1800 06/27/24 0625 06/28/24 0410 06/30/24 0744  WBC 34.9*  --   --  32.0*  --  28.1* 14.3* 15.5*  CREATININE 2.15*   < >  --  1.85* 1.45* 1.33* 1.19 0.99  LATICACIDVEN  --   --  1.5  --   --   --   --   --    < > = values in this interval not displayed.    Estimated Creatinine Clearance: 64.4 mL/min (by C-G formula based on SCr of 0.99 mg/dL).    Allergies  Allergen Reactions   Ciprofloxacin Other (See Comments)    Not listed on MAR    Clindamycin/Lincomycin Other (See Comments)    Not listed on MAR    Clonidine Derivatives Other (See Comments)    Not listed on MAR   Cyproheptadine Other (See Comments)    Not listed on MAR   Lisinopril Other (See Comments)    Not listed on MAR   Myrbetriq [Mirabegron] Other (See  Comments)    Not listed on MAR   Penicillins Other (See Comments)    Not listed on MAR   Streptokinases Other (See Comments)    Not listed on MAR    Antimicrobials this admission: Vancomycn 7/5 x1 Cefepime  7/5 >> (7/11) Linezolid  7/5 >> 7/7 Azith 7/6 >> 7/8 Flagyl  7/5   Microbiology results: 7/5 MRSA: negative 7/5 Blood cx: 1 of 4 MRSE 7/5 Resp: negative   Thank you for allowing pharmacy to be a part of this patient's care. Levorn Gaskins, RPh Clinical Pharmacist [phone] until 3PM 06/30/2024 1:10 PM Please check AMION for all [facility] phone numbers After 10:00 PM, call Main Pharmacy [phone]

## 2024-06-30 NOTE — Hospital Course (Addendum)
 78 years old male with past medical history of multiple comorbidities, admitted to the hospital for management of septic shock in the setting of multifocal pneumonia, transferred out of the ICU on 06/28/2024.  He is a resident of [facility] skilled nursing facility.    Assessment and Plan:   Acute on chronic hypoxic respiratory failure - Acutely worse overnight.  Currently on 4 L nasal cannula this morning. To wean O2 as tolerated   Septic shock - Shock resolved, transition out of ICU 7/7.   Acute metabolic encephalopathy - Patient appears more obtunded/lethargic.  Worsening O2 overnight, suspicion for worsening pneumonia, possible aspiration.  Attempted treating as below.  Monitor function closely.   Multifocal pneumonia - Repeat imaging personally reviewed overnight noting slight worsening in the right upper lobe.  Patient more rhonchorous and hypoxic this morning.  Concern for worsening pneumonia or aspiration.  My provider initiated vancomycin .  Will also initiate azithromycin  500 mg daily x 3 days to cover atypicals.  Nebulizers, wean O2 as above.     Hypokalemia with severe hypomagnesemia - Potassium improved but magnesium  still low despite replenishment.  Will order 4g IV magnesium , discontinue 400 mg p.o. magnesium  twice daily.  Will recheck BMP and magnesium  in AM.   Acute diarrhea - Likely secondary to antibiotic use.  Had couple of loose stools since yesterday.  Loperamide  as needed.  Will order probiotic.  If diarrhea persists, may warrant check for C. difficile.   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  Patient at baseline.   Acute kidney injury - Likely initially ATN in the setting of septic shock.  Now a bit dehydrated secondary to poor p.o. intake.  Showing improvement with IV fluid hydration.  Recheck BMP in AM.   Acute on chronic normocytic anemia - Status post 1 unit PRBC 7/7.  Globin stable.  No active bleeding appreciated.  Likely anemia of chronic  disease/inflammation.  Will recheck CBC in AM.   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   Physical debilitation muscle weakness - Previously at short-term rehab.  PT/OT on board recommending same.   Arthritic hand pain - Chronic appearing.  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  Voltaren  gel on board.   Skin tear lower extremity - Current during therapy session.  Cleaned, Steri-Strips applied, covered with bandage.  Change dressing daily.   Goals of care - Working closely with TOC on disposition back to [facility].  Pending insurance authorization.  Anticipate discharge later this week.

## 2024-06-30 NOTE — TOC Progression Note (Signed)
 Transition of Care [facility]) - Progression Note    Patient Details  Name: Phillip Logan MRN: [id number] Date of Birth: 1946-06-04  Transition of [facility]) CM/SW Contact  Inocente GORMAN Kindle, LCSW Phone Number: 06/30/2024, 9:44 AM  Clinical Narrative:    CSW following for PT/OT notes to submit for insurance authorization for patient to return to SNF once stable. RNCM ordered consult.    Expected Discharge Plan: Skilled Nursing Facility Barriers to Discharge: Continued Medical Work up, Conservator, museum/gallery and Services In-house Referral: Clinical Social Work   Post Acute Care Choice: Skilled Nursing Facility Living arrangements for the past 2 months: Skilled Holiday representative, Single Family Home                                       Social Determinants of Health (SDOH) Interventions SDOH Screenings   Food Insecurity: No Food Insecurity (11/02/2019)   [facility]  Transportation Needs: No Transportation Needs (11/02/2019)   Received from [facility]  Financial Resource Strain: Low Risk  (11/02/2019)   Received from [facility]  Tobacco Use: Medium Risk (06/29/2024)    Readmission Risk Interventions     No data to display

## 2024-07-01 DIAGNOSIS — M19042 Primary osteoarthritis, left hand: Secondary | ICD-10-CM

## 2024-07-01 DIAGNOSIS — J9601 Acute respiratory failure with hypoxia: Secondary | ICD-10-CM | POA: Diagnosis not present

## 2024-07-01 DIAGNOSIS — A419 Sepsis, unspecified organism: Secondary | ICD-10-CM | POA: Diagnosis not present

## 2024-07-01 DIAGNOSIS — J189 Pneumonia, unspecified organism: Secondary | ICD-10-CM | POA: Diagnosis not present

## 2024-07-01 DIAGNOSIS — E876 Hypokalemia: Secondary | ICD-10-CM

## 2024-07-01 DIAGNOSIS — M19041 Primary osteoarthritis, right hand: Secondary | ICD-10-CM

## 2024-07-01 DIAGNOSIS — N179 Acute kidney failure, unspecified: Secondary | ICD-10-CM | POA: Diagnosis not present

## 2024-07-01 LAB — CBC
HCT: 29.4 % — ABNORMAL LOW (ref 39.0–52.0)
Hemoglobin: 9.3 g/dL — ABNORMAL LOW (ref 13.0–17.0)
MCH: 27.9 pg (ref 26.0–34.0)
MCHC: 31.6 g/dL (ref 30.0–36.0)
MCV: 88.3 fL (ref 80.0–100.0)
Platelets: 209 K/uL (ref 150–400)
RBC: 3.33 MIL/uL — ABNORMAL LOW (ref 4.22–5.81)
RDW: 15.2 % (ref 11.5–15.5)
WBC: 13.4 K/uL — ABNORMAL HIGH (ref 4.0–10.5)
nRBC: 0 % (ref 0.0–0.2)

## 2024-07-01 LAB — MAGNESIUM: Magnesium: 1.1 mg/dL — ABNORMAL LOW (ref 1.7–2.4)

## 2024-07-01 LAB — BASIC METABOLIC PANEL WITH GFR
Anion gap: 10 (ref 5–15)
BUN: 14 mg/dL (ref 8–23)
CO2: 30 mmol/L (ref 22–32)
Calcium: 8 mg/dL — ABNORMAL LOW (ref 8.9–10.3)
Chloride: 93 mmol/L — ABNORMAL LOW (ref 98–111)
Creatinine, Ser: 0.93 mg/dL (ref 0.61–1.24)
GFR, Estimated: >60 mL/min (ref >60)
Glucose, Bld: 84 mg/dL (ref 70–99)
Potassium: 2.9 mmol/L — ABNORMAL LOW (ref 3.5–5.1)
Sodium: 133 mmol/L — ABNORMAL LOW (ref 135–145)

## 2024-07-01 LAB — CULTURE, BLOOD (ROUTINE X 2): Culture: NO GROWTH

## 2024-07-01 LAB — GLUCOSE, CAPILLARY
Glucose-Capillary: 86 mg/dL (ref 70–99)
Glucose-Capillary: 82 mg/dL (ref 70–99)
Glucose-Capillary: 130 mg/dL — ABNORMAL HIGH (ref 70–99)
Glucose-Capillary: 77 mg/dL (ref 70–99)
Glucose-Capillary: 87 mg/dL (ref 70–99)

## 2024-07-01 MED ORDER — DICLOFENAC SODIUM 1 % EX GEL
2.0000 g | Freq: Four times a day (QID) | CUTANEOUS | Status: DC
  Administered 2024-07-01 – 2024-07-05 (×16): 2 g via TOPICAL
  Filled 2024-07-01 (×2): qty 100

## 2024-07-01 MED ORDER — POTASSIUM CHLORIDE 20 MEQ PO PACK
40.0000 meq | PACK | Freq: Two times a day (BID) | ORAL | Status: AC
  Administered 2024-07-01: 40 meq via ORAL
  Filled 2024-07-01: qty 2

## 2024-07-01 MED ORDER — MAGNESIUM OXIDE -MG SUPPLEMENT 400 (240 MG) MG PO TABS
400.0000 mg | ORAL_TABLET | Freq: Two times a day (BID) | ORAL | Status: DC
  Administered 2024-07-01: 400 mg via ORAL
  Filled 2024-07-01: qty 1

## 2024-07-01 MED ORDER — MAGNESIUM SULFATE 2 GM/50ML IV SOLN
2.0000 g | Freq: Once | INTRAVENOUS | Status: AC
  Administered 2024-07-01: 2 g via INTRAVENOUS
  Filled 2024-07-01: qty 50

## 2024-07-01 MED ORDER — POTASSIUM CHLORIDE 10 MEQ/100ML IV SOLN
10.0000 meq | INTRAVENOUS | Status: AC
  Administered 2024-07-01 (×3): 10 meq via INTRAVENOUS
  Filled 2024-07-01 (×3): qty 100

## 2024-07-01 NOTE — Progress Notes (Signed)
 Progress Note   Patient: Phillip Logan FMW:[id number] DOB: 1946-07-15 DOA: 06/26/2024  DOS: the patient was seen and examined on 07/01/2024   Brief hospital course:  78 years old male with past medical history of multiple comorbidities, admitted to the hospital for management of septic shock in the setting of multifocal pneumonia, transferred out of the ICU on 06/28/2024.  He is a resident of [facility] skilled nursing facility.    Assessment and Plan:   Acute on chronic hypoxic respiratory failure - Currently on 2 L nasal cannula.  Patient states that he normally does not wear nasal cannula however unclear if this is true.  Possible that patient may need to be discharged with home O2.   Septic shock - Shock resolved, transition out of ICU 7/7.   Multifocal pneumonia - Currently on broad-spectrum antibiotic coverage.  Showing mild improvement.  Nebulizers, wean O2 as above.  Strep pneumo/Legionella negative.  Respiratory pathogen profile negative.  Leukocytosis improving.  Hypokalemia with severe hypomagnesemia - Potassium 2.9 this morning with magnesium  1.1.  Ordered 2 g IV magnesium  with 400 mg p.o. magnesium  twice daily from now on.  Supplemented KCl 40 mill equivalents as well as daily potassium p.o.  Will recheck BMP and magnesium  in AM.   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  Patient at baseline.   Acute kidney injury - Likely ATN in the setting of septic shock.  Showing improvement with IV fluid hydration.  Recheck BMP in AM.   Acute on chronic normocytic anemia - Status post 1 unit PRBC 7/7.  Globin stable.  No active bleeding appreciated.  Likely anemia of chronic disease/inflammation.  Will recheck CBC in AM.   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  Physical debilitation muscle weakness - Previously at short-term rehab.  PT/OT on board recommending same.  Arthritic  hand pain - Chronic appearing.  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  Will order Voltaren  gel.   Goals of care - Working closely with TOC on disposition back to [facility].  Pending insurance authorization.  Anticipate discharge later this week.   Subjective: Patient resting comfortably this morning.  Still complains of feeling weak.  Denies any nausea, vomiting, fever, shortness of breath, chest pain, abdominal pain.  Complaining of continued pain in his hands.  Physical Exam:  Vitals:   07/01/24 0440 07/01/24 0500 07/01/24 0900 07/01/24 1214  BP: 137/82  138/78 123/67  Pulse: 94  89 96  Resp: 19  18 20   Temp: 98.2 F (36.8 C)  98 F (36.7 C) 97.6 F (36.4 C)  TempSrc:   Oral   SpO2: 99%  100% 92%  Weight:  83.2 kg    Height:        GENERAL:  Alert, pleasant, disheveled, frail HEENT:  EOMI, dysarthria, right neck distended (chronic) CARDIOVASCULAR:  RRR, no murmurs appreciated RESPIRATORY:  Clear to auscultation, no wheezing, rales, or rhonchi GASTROINTESTINAL:  Soft, nontender, nondistended EXTREMITIES: Atrophy in hands, no LE edema bilaterally NEURO:  No new focal deficits appreciated SKIN: Scattered ecchymosis PSYCH:  Appropriate mood and affect   Data Reviewed:  Imaging Studies: ECHOCARDIOGRAM COMPLETE Result Date: 06/26/2024    ECHOCARDIOGRAM REPORT   Patient Name:   Phillip Logan Date of Exam: 06/26/2024 Medical Rec #:  [id number]                Height:  67.0 in Accession #:    [id number]               Weight:       177.2 lb Date of Birth:  June 09, 1946                 BSA:          1.921 m Patient Age:    77 years                 BP:           75/47 mmHg Patient Gender: M                        HR:           115 bpm. Exam Location:  Inpatient Procedure: 2D Echo, Cardiac Doppler and Color Doppler (Both Spectral and Color            Flow Doppler were utilized during procedure). Indications:    Shock  History:         Patient has no prior history of Echocardiogram examinations.  Sonographer:    Phillip Logan Referring Phys: [phone] Phillip Logan IMPRESSIONS  1. Left ventricular ejection fraction, by estimation, is 55 to 60%. The left ventricle has normal function. The left ventricle has no regional wall motion abnormalities. Left ventricular diastolic parameters were normal.  2. Right ventricular systolic function is mildly reduced. The right ventricular size is normal. Tricuspid regurgitation signal is inadequate for assessing PA pressure.  3. Right atrial size was mildly dilated.  4. The mitral valve is degenerative. Mild mitral valve regurgitation.  5. The aortic valve was not well visualized, probably trilealet. There is moderate calcification of the aortic valve. Aortic valve regurgitation is trivial. Moderate aortic valve stenosis. Aortic valve area, by VTI measures 1.28 cm. Aortic valve mean gradient measures 29.0 mmHg. Dimentionless index 0.41.  6. The inferior vena cava is normal in size with greater than 50% respiratory variability, suggesting right atrial pressure of 3 mmHg. Comparison(s): No prior Echocardiogram. FINDINGS  Left Ventricle: Left ventricular ejection fraction, by estimation, is 55 to 60%. The left ventricle has normal function. The left ventricle has no regional wall motion abnormalities. The left ventricular internal cavity size was normal in size. There is  borderline left ventricular hypertrophy. Left ventricular diastolic parameters were normal. Right Ventricle: The right ventricular size is normal. No increase in right ventricular wall thickness. Right ventricular systolic function is mildly reduced. Tricuspid regurgitation signal is inadequate for assessing PA pressure. Left Atrium: Left atrial size was normal in size. Right Atrium: Right atrial size was mildly dilated. Pericardium: There is no evidence of pericardial effusion. Presence of epicardial fat layer. Mitral Valve: The mitral valve  is degenerative in appearance. There is mild calcification of the mitral valve leaflet(s). Mild mitral annular calcification. Mild mitral valve regurgitation. Tricuspid Valve: The tricuspid valve is grossly normal. Tricuspid valve regurgitation is mild. Aortic Valve: The aortic valve was not well visualized. There is moderate calcification of the aortic valve. There is moderate aortic valve annular calcification. Aortic valve regurgitation is trivial. Moderate aortic stenosis is present. Aortic valve mean gradient measures 29.0 mmHg. Aortic valve peak gradient measures 53.7 mmHg. Aortic valve area, by VTI measures 1.28 cm. Pulmonic Valve: The pulmonic valve was not well visualized. Pulmonic valve regurgitation is trivial. Aorta: The aortic root and ascending aorta are structurally normal, with no evidence of dilitation. Venous: The inferior vena  cava is normal in size with greater than 50% respiratory variability, suggesting right atrial pressure of 3 mmHg. IAS/Shunts: No atrial level shunt detected by color flow Doppler. Additional Comments: 3D was performed not requiring image post processing on an independent workstation and was indeterminate.  LEFT VENTRICLE PLAX 2D LVIDd:         5.10 cm      Diastology LVIDs:         3.90 cm      LV e' medial:    12.90 cm/s LV PW:         1.00 cm      LV E/e' medial:  6.3 LV IVS:        0.90 cm      LV e' lateral:   18.20 cm/s LVOT diam:     2.00 cm      LV E/e' lateral: 4.5 LV SV:         68 LV SV Index:   35 LVOT Area:     3.14 cm  LV Volumes (MOD) LV vol d, MOD A2C: 119.0 ml LV vol d, MOD A4C: 71.1 ml LV vol s, MOD A2C: 45.6 ml LV vol s, MOD A4C: 24.3 ml LV SV MOD A2C:     73.4 ml LV SV MOD A4C:     71.1 ml LV SV MOD BP:      63.9 ml RIGHT VENTRICLE             IVC RV Basal diam:  4.30 cm     IVC diam: 1.30 cm RV S prime:     16.30 cm/s TAPSE (M-mode): 2.2 cm LEFT ATRIUM           Index        RIGHT ATRIUM           Index LA diam:      3.20 cm 1.67 cm/m   RA Area:      21.90 cm LA Vol (A4C): 46.9 ml 24.41 ml/m  RA Volume:   66.20 ml  34.46 ml/m  AORTIC VALVE AV Area (Vmax):    1.18 cm AV Area (Vmean):   1.25 cm AV Area (VTI):     1.28 cm AV Vmax:           366.50 cm/s AV Vmean:          246.250 cm/s AV VTI:            0.526 m AV Peak Grad:      53.7 mmHg AV Mean Grad:      29.0 mmHg LVOT Vmax:         138.00 cm/s LVOT Vmean:        97.800 cm/s LVOT VTI:          0.215 m LVOT/AV VTI ratio: 0.41  AORTA Ao Root diam: 2.80 cm Ao Asc diam:  3.20 cm MITRAL VALVE MV Area (PHT): 3.61 cm    SHUNTS MV Decel Time: 210 msec    Systemic VTI:  0.22 m MV E velocity: 81.20 cm/s  Systemic Diam: 2.00 cm MV A velocity: 92.80 cm/s MV E/A ratio:  0.88 Jayson Sierras MD Electronically signed by Jayson Sierras MD Signature Date/Time: 06/26/2024/3:58:18 PM    Final    DG Chest Port 1 View Result Date: 06/26/2024 CLINICAL DATA:  Questionable sepsis - evaluate for abnormality EXAM: PORTABLE CHEST 1 VIEW COMPARISON:  Radiograph 06/12/2024 and 05/31/2024.  CT 05/25/2024. FINDINGS: 1111 hours. The heart size and mediastinal  contours are stable. There are progressive basilar predominant airspace opacities in both lungs with poor definition of the pulmonary vasculature. No pneumothorax or significant pleural effusion. The bones appear unchanged status post multilevel cervical fusion. Telemetry leads overlie the chest. IMPRESSION: Progressive basilar predominant airspace opacities in both lungs, suspicious for multifocal pneumonia. Electronically Signed   By: Elsie Perone M.D.   On: 06/26/2024 12:10   CT ANGIO HEAD NECK W WO CM W PERF (CODE STROKE) Result Date: 06/26/2024 CLINICAL DATA:  78 year old male code stroke presentation. Abnormal speech, right facial droop. EXAM: CT ANGIOGRAPHY HEAD AND NECK CT PERFUSION BRAIN TECHNIQUE: Multidetector CT imaging of the head and neck was performed using the standard protocol during bolus administration of intravenous contrast. Multiplanar CT image  reconstructions and MIPs were obtained to evaluate the vascular anatomy. Carotid stenosis measurements (when applicable) are obtained utilizing NASCET criteria, using the distal internal carotid diameter as the denominator. Multiphase CT imaging of the brain was performed following IV bolus contrast injection. Subsequent parametric perfusion maps were calculated using RAPID software. RADIATION DOSE REDUCTION: This exam was performed according to the departmental dose-optimization program which includes automated exposure control, adjustment of the mA and/or kV according to patient size and/or use of iterative reconstruction technique. CONTRAST:  Not specified. COMPARISON:  Head CT 1043 hours today. FINDINGS: CT Brain Perfusion Findings: ASPECTS: 10 CBF (<30%) Volume: 0mL Perfusion (Tmax>6.0s) volume: 0mL Mismatch Volume: Not applicable Infarction Location:Not applicable CTA NECK Skeleton: Cervical ACDF C4 through C7 with evidence of solid arthrodesis. Developing degenerative interbody ankylosis at C3-C4. Carious dentition. Redemonstrated left vertex skull outer table erosion series 5, image 6. No other acute or suspicious osseous lesion identified. Upper chest: Multifocal upper lung peribronchial spiculated and ground-glass opacity greater on the right (series 5, image 163) up to 3 cm diameter, nonspecific. Small layering left pleural effusion. No superior mediastinal lymphadenopathy identified. Other neck: Previous right neck dissection. Surgically absent submandibular and parotid glands. Numerous right neck surgical clips. Right IJ remains in place. Right sternocleidomastoid muscle appears small but in place. No superimposed neck mass or lymphadenopathy is identified. Aortic arch: Calcified aortic atherosclerosis.  Three vessel arch. Right carotid system: Brachiocephalic artery plaque with up to 50 % stenosis with respect to the distal vessel. Right CCA origin relatively spared. Soft and calcified plaque through  the right carotid bifurcation without significant stenosis. Bulky calcified plaque right ICA bulb with less than 50 % stenosis with respect to the distal vessel. Left carotid system: Motion artifact. Left CCA origin plaque within unclear degree of subsequent stenosis. Left CCA remains patent. Anterior vessel soft and calcified plaque before the bifurcation without stenosis. Calcified plaque at the left ICA origin and bulb without stenosis. Tortuosity below the skull base. Vertebral arteries: Proximal right subclavian artery and right vertebral artery origin patent with only mild plaque and no significant stenosis. Non dominant appearing right vertebral artery remains patent to the skull base. Bulky left subclavian origin plaque although detail degraded by motion. Hemodynamically significant stenosis suspected on series 10, image 42. Left subclavian and left vertebral artery origin remain patent. Mild left vertebral origin calcified plaque without stenosis. Dominant left vertebral artery patent to the skull base without stenosis. CTA HEAD Posterior circulation: Dominant left vertebral artery with mild calcified the 4 segment plaque and no stenosis to the vertebrobasilar junction. Diminutive right V4 segment but remains patent to the basilar. Bilateral AICA appear dominant and patent. Patent basilar artery without stenosis. Tortuous basilar. Patent SCA and PCA origins. Posterior communicating  arteries are diminutive or absent. Moderate to severe left and moderate right P1 segment irregularity and stenosis on series 7, image 18. Additional moderate to severe left P2 segment stenosis on series 9, image 19. Moderate additional multifocal right P2 segment stenosis on series 9, image 14. Maintained bilateral distal PCA enhancement. Anterior circulation: Both ICA siphons are patent. Calcified left cavernous and supraclinoid segment plaque with only mild stenosis. Similar calcified right siphon plaque with only mild  stenosis. Patent carotid termini. Patent MCA and ACA origins. Normal anterior communicating artery. Bilateral ACA branches are within normal limits. Left MCA M1 segment and trifurcation are patent without stenosis. Right MCA M1 segment and trifurcation are patent without stenosis. Bilateral MCA branches are within normal limits. Venous sinuses: Patent. Anatomic variants: Dominant left vertebral artery. Review of the MIP images confirms the above findings IMPRESSION: 1. Negative CT Perfusion. 2. Abundant atherosclerosis at the aortic arch, throughout the head and neck. But negative for large vessel occlusion. Aortic Atherosclerosis (ICD10-I70.0). Notable stenoses: -  Moderate to Severe bilateral PCA P1 and P2. - motion artifact but probably hemodynamically significant Left Subclavian Origin stenosis. And similar plaque but unclear degree of stenosis at the Left CCA origin. 3. Nonspecific abnormal right greater than left upper lobe lung peribronchial opacity, mildly spiculated. Small layering left pleural effusion. 4. Previous Right neck dissection. New or increased left vertex outer table skull erosion from Head CT last month. But no other mass or metastatic disease identified in the Neck. Electronically Signed   By: VEAR Hurst M.D.   On: 06/26/2024 11:32   CT HEAD CODE STROKE WO CONTRAST Result Date: 06/26/2024 CLINICAL DATA:  Code stroke.  78 year old male. EXAM: CT HEAD WITHOUT CONTRAST TECHNIQUE: Contiguous axial images were obtained from the base of the skull through the vertex without intravenous contrast. RADIATION DOSE REDUCTION: This exam was performed according to the departmental dose-optimization program which includes automated exposure control, adjustment of the mA and/or kV according to patient size and/or use of iterative reconstruction technique. COMPARISON:  Head CT 05/31/2024. FINDINGS: Brain: No midline shift, ventriculomegaly, mass effect, evidence of mass lesion, intracranial hemorrhage or  evidence of cortically based acute infarction. Streak artifact from the right globe. Patchy and confluent bilateral cerebral white matter hypodensity with some deep white matter capsule involvement which is fairly symmetric on both sides. No cytotoxic edema is identified. Vascular: Calcified atherosclerosis at the skull base. No suspicious intracranial vascular hyperdensity. Skull: There is a new or increased vertex skull erosion on the left, series 4, image 87 and encompassing an area of about 2 cm. Other visible bone mineralization within normal limits. No other acute osseous abnormality identified. Sinuses/Orbits: Right ethmoid and left sphenoid sinus mucosal thickening not significantly changed. Other: Chronic metal artifact along the anterior right globe, multiple surgical clips in the visible right face, along the right temporalis muscle. ASPECTS [facility] Stroke Program Early CT Score) Total score (0-10 with 10 being normal): 10 IMPRESSION: 1. No acute cortically based infarct or intracranial hemorrhage identified. ASPECTS 10. 2. New left vertex skull erosion since 05/31/2024, suspicious for a metastatic lesion. These results were communicated to Dr. Michaela at 10:50 am on 06/26/2024 by text page via the [facility] messaging system. Electronically Signed   By: VEAR Hurst M.D.   On: 06/26/2024 10:51    No new imaging to review  Previous records (including but not limited to H&P, progress notes, nursing notes, TOC management) were reviewed in assessment of this patient.  Labs: CBC: Recent Labs  Lab 06/26/24 1036 06/26/24 1037 06/26/24 1407 06/27/24 0625 06/28/24 0410 06/28/24 2136 06/30/24 0744 07/01/24 0429  WBC 34.9*  --  32.0* 28.1* 14.3*  --  15.5* 13.4*  NEUTROABS 31.6*  --   --   --   --   --  13.3*  --   HGB 9.1*   < > 7.2* 7.7* 6.5* 8.0* 10.6* 9.3*  HCT 28.9*   < > 22.9* 24.7* 21.2* 25.4* 33.2* 29.4*  MCV 92.6  --  92.0 91.8 91.0  --  89.0 88.3  PLT 272  --  230 253 193  --  241 209    < > = values in this interval not displayed.   Basic Metabolic Panel: Recent Labs  Lab 06/26/24 1800 06/27/24 0625 06/27/24 2000 06/28/24 0410 06/30/24 0744 07/01/24 0429  NA 134* 133*  --  132* 136 133*  K 4.1 4.0  --  4.0 3.1* 2.9*  CL 102 100  --  98 94* 93*  CO2 22 24  --  25 28 30   GLUCOSE 105* 95  --  95 68* 84  BUN 35* 32*  --  28* 16 14  CREATININE 1.45* 1.33*  --  1.19 0.99 0.93  CALCIUM 7.4* 8.0*  --  8.3* 8.5* 8.0*  MG  --  1.3* 2.6* 2.4  --  1.1*  PHOS  --  3.5  --  2.2*  --   --    Liver Function Tests: Recent Labs  Lab 06/26/24 1036  AST 24  ALT 18  ALKPHOS 67  BILITOT 0.9  PROT 5.6*  ALBUMIN  1.9*   CBG: Recent Labs  Lab 06/30/24 2042 06/30/24 2351 07/01/24 0443 07/01/24 0843 07/01/24 1216  GLUCAP 104* 96 86 82 130*    Scheduled Meds:  sodium chloride    Intravenous Once   sodium chloride    Intravenous Once   Chlorhexidine  Gluconate Cloth  6 each Topical Daily   colchicine   0.6 mg Oral BID   gabapentin   400 mg Oral BID   Gerhardt's butt cream   Topical BID   heparin   5,000 Units Subcutaneous Q8H   insulin  aspart  0-9 Units Subcutaneous Q4H   magnesium  oxide  400 mg Oral BID   nicotine   14 mg Transdermal Daily   potassium chloride   40 mEq Oral BID   sodium chloride  flush  10-40 mL Intracatheter Q12H   sodium chloride  flush  3 mL Intravenous Once   Continuous Infusions:  ceFEPime  (MAXIPIME ) IV 2 g (07/01/24 0545)   PRN Meds:.acetaminophen , albuterol , docusate sodium , guaiFENesin , metoprolol  tartrate, ondansetron  (ZOFRAN ) IV, mouth rinse, oxyCODONE , polyethylene glycol, sodium chloride  flush  Family Communication: None at bedside  Disposition: Status is: Inpatient Remains inpatient appropriate because: Hypokalemia/hypomagnesemia     Time spent: 49 minutes  Length of inpatient stay: 5 days  Author: Carliss LELON Canales, DO 07/01/2024 1:21 PM  For on call review [email or web address].

## 2024-07-01 NOTE — TOC Progression Note (Signed)
 Transition of Care [facility]) - Progression Note    Patient Details  Name: Phillip Logan MRN: [id number] Date of Birth: 06-Nov-1946  [facility]) CM/SW Contact  Inocente GORMAN Kindle, LCSW Phone Number: 07/01/2024, 2:17 PM  Clinical Narrative:    CSW initiated insurance process for [facility], Ref# [id number].    Expected Discharge Plan: Skilled Nursing Facility Barriers to Discharge: Continued Medical Work up, English as a second language teacher  Expected Discharge Plan and Services In-house Referral: Clinical Social Work   Post Acute Care Choice: Skilled Nursing Facility Living arrangements for the past 2 months: Skilled Nursing Facility, Single Family Home                                       Social Determinants of Health (SDOH) Interventions SDOH Screenings   Food Insecurity: Patient Unable To Answer (07/01/2024)  Housing: Unknown (07/01/2024)  Transportation Needs: No Transportation Needs (07/01/2024)  Utilities: Not At Risk (07/01/2024)  Financial Resource Strain: Low Risk  (11/02/2019)   Received from [facility]  Social Connections: Socially Isolated (07/01/2024)  Tobacco Use: Medium Risk (06/29/2024)    Readmission Risk Interventions     No data to display

## 2024-07-01 NOTE — Progress Notes (Signed)
 Physical Therapy Treatment Patient Details Name: Phillip Logan MRN: [id number] DOB: [birth year] Today's Date: 07/01/2024   History of Present Illness Pt is a 78 y/o M presenting to ED on 7/5 from [facility] as code stroke with slurred speech and R facial droop, code stroke cancelled. Found to be hypoxemic with bil infiltrates concerning for PNA and hypotension. CT head negative. Admitted for septic shock in the setting of multifocal PNA. PMH includes COPD, HTN, clotting disorder, R facial reconstruction surgery    PT Comments  Pt resting in bed upon arrival to room, nods yes agreeable to PT session focused on EOB and/or OOB mobility. Pt discovered to be soiled in liquid stool to his knees, requiring extensive assist for bed mobility for clean up. Pt does participate in rolling but requiring max truncal assist to complete. Once pt was cleaned up, plan was for transition to EOB to continue session, but unable to attempt, as pt continuing to soil self and NT arrived to room. Plan remains appropriate.     If plan is discharge home, recommend the following: Assistance with cooking/housework;Assist for transportation;Help with stairs or ramp for entrance;Two people to help with walking and/or transfers;Two people to help with bathing/dressing/bathroom   Can travel by private vehicle        Equipment Recommendations  None recommended by PT    Recommendations for Other Services       Precautions / Restrictions Precautions Precautions: Fall Precaution/Restrictions Comments: watch O2 Restrictions Weight Bearing Restrictions Per Provider Order: No     Mobility  Bed Mobility Overal bed mobility: Needs Assistance Bed Mobility: Rolling Rolling: Max assist, Used rails         General bed mobility comments: assist for hip and trunk translation, pt reaching for opposite rail with cues. rolling bilat for pericare. boost up +2 once linens changed.    Transfers                    General transfer comment: unable to attempt, pt continuing to soil self and NT arrived to room    Ambulation/Gait                   Stairs             Wheelchair Mobility     Tilt Bed    Modified Rankin (Stroke Patients Only)       Balance Overall balance assessment: Needs assistance     Sitting balance - Comments: nt       Standing balance comment: nt                            Communication Communication Communication: Impaired Factors Affecting Communication: Reduced clarity of speech;Difficulty expressing self (mostly unintelligible speech)  Cognition Arousal: Alert Behavior During Therapy: Restless   PT - Cognitive impairments: No family/caregiver present to determine baseline                         Following commands: Intact      Cueing Cueing Techniques: Verbal cues  Exercises      General Comments General comments (skin integrity, edema, etc.): 2LO2      Pertinent Vitals/Pain Pain Assessment Pain Assessment: Faces Faces Pain Scale: Hurts little more Pain Location: generalized, with bed mobility and pericare Pain Descriptors / Indicators: Discomfort Pain Intervention(s): Limited activity within patient's tolerance, Monitored during session, Repositioned    Home  Living                          Prior Function            PT Goals (current goals can now be found in the care plan section) Acute Rehab PT Goals Patient Stated Goal: to feel better PT Goal Formulation: With patient Time For Goal Achievement: 07/14/24 Potential to Achieve Goals: Good Progress towards PT goals: Progressing toward goals    Frequency    Min 2X/week      PT Plan      Co-evaluation              AM-PAC PT 6 Clicks Mobility   Outcome Measure  Help needed turning from your back to your side while in a flat bed without using bedrails?: A Lot Help needed moving from lying on your back to sitting  on the side of a flat bed without using bedrails?: Total Help needed moving to and from a bed to a chair (including a wheelchair)?: Total Help needed standing up from a chair using your arms (e.g., wheelchair or bedside chair)?: Total Help needed to walk in hospital room?: Total Help needed climbing 3-5 steps with a railing? : Total 6 Click Score: 7    End of Session Equipment Utilized During Treatment: Oxygen Activity Tolerance: Patient limited by fatigue;Other (comment) (bowel and bladder incontinence) Patient left: in bed;with call bell/phone within reach;with bed alarm set Nurse Communication: Mobility status PT Visit Diagnosis: Other abnormalities of gait and mobility (R26.89);Muscle weakness (generalized) (M62.81)     Time: 8499-8471 PT Time Calculation (min) (ACUTE ONLY): 28 min  Charges:    $Therapeutic Activity: 8-22 mins $Self Care/Home Management: 8-22 PT General Charges $$ ACUTE PT VISIT: 1 Visit                     Johana RAMAN, PT DPT Acute Rehabilitation Services Secure Chat Preferred  Office [phone]    Dionne Rossa E Johna 07/01/2024, 3:45 PM

## 2024-07-02 ENCOUNTER — Inpatient Hospital Stay (HOSPITAL_COMMUNITY)

## 2024-07-02 DIAGNOSIS — J189 Pneumonia, unspecified organism: Secondary | ICD-10-CM | POA: Diagnosis not present

## 2024-07-02 DIAGNOSIS — A419 Sepsis, unspecified organism: Secondary | ICD-10-CM | POA: Diagnosis not present

## 2024-07-02 DIAGNOSIS — N179 Acute kidney failure, unspecified: Secondary | ICD-10-CM | POA: Diagnosis not present

## 2024-07-02 DIAGNOSIS — J9601 Acute respiratory failure with hypoxia: Secondary | ICD-10-CM | POA: Diagnosis not present

## 2024-07-02 LAB — CBC WITH DIFFERENTIAL/PLATELET
Abs Immature Granulocytes: 0.39 K/uL — ABNORMAL HIGH (ref 0.00–0.07)
Basophils Absolute: 0.1 K/uL (ref 0.0–0.1)
Basophils Relative: 0 %
Eosinophils Absolute: 0.2 K/uL (ref 0.0–0.5)
Eosinophils Relative: 1 %
HCT: 35.5 % — ABNORMAL LOW (ref 39.0–52.0)
Hemoglobin: 11.4 g/dL — ABNORMAL LOW (ref 13.0–17.0)
Immature Granulocytes: 2 %
Lymphocytes Relative: 4 %
Lymphs Abs: 0.6 K/uL — ABNORMAL LOW (ref 0.7–4.0)
MCH: 28.3 pg (ref 26.0–34.0)
MCHC: 32.1 g/dL (ref 30.0–36.0)
MCV: 88.1 fL (ref 80.0–100.0)
Monocytes Absolute: 1.4 K/uL — ABNORMAL HIGH (ref 0.1–1.0)
Monocytes Relative: 9 %
Neutro Abs: 13.3 K/uL — ABNORMAL HIGH (ref 1.7–7.7)
Neutrophils Relative %: 84 %
Platelets: 221 K/uL (ref 150–400)
RBC: 4.03 MIL/uL — ABNORMAL LOW (ref 4.22–5.81)
RDW: 15.4 % (ref 11.5–15.5)
WBC: 15.9 K/uL — ABNORMAL HIGH (ref 4.0–10.5)
nRBC: 0 % (ref 0.0–0.2)

## 2024-07-02 LAB — COMPREHENSIVE METABOLIC PANEL WITH GFR
ALT: 34 U/L (ref 0–44)
AST: 39 U/L (ref 15–41)
Albumin: 2.4 g/dL — ABNORMAL LOW (ref 3.5–5.0)
Alkaline Phosphatase: 124 U/L (ref 38–126)
Anion gap: 16 — ABNORMAL HIGH (ref 5–15)
BUN: 10 mg/dL (ref 8–23)
CO2: 26 mmol/L (ref 22–32)
Calcium: 8.6 mg/dL — ABNORMAL LOW (ref 8.9–10.3)
Chloride: 95 mmol/L — ABNORMAL LOW (ref 98–111)
Creatinine, Ser: 0.96 mg/dL (ref 0.61–1.24)
GFR, Estimated: >60 mL/min (ref >60)
Glucose, Bld: 74 mg/dL (ref 70–99)
Potassium: 3.6 mmol/L (ref 3.5–5.1)
Sodium: 137 mmol/L (ref 135–145)
Total Bilirubin: 1.7 mg/dL — ABNORMAL HIGH (ref 0.0–1.2)
Total Protein: 6.5 g/dL (ref 6.5–8.1)

## 2024-07-02 LAB — GLUCOSE, CAPILLARY
Glucose-Capillary: 73 mg/dL (ref 70–99)
Glucose-Capillary: 73 mg/dL (ref 70–99)
Glucose-Capillary: 64 mg/dL — ABNORMAL LOW (ref 70–99)
Glucose-Capillary: 124 mg/dL — ABNORMAL HIGH (ref 70–99)
Glucose-Capillary: 87 mg/dL (ref 70–99)
Glucose-Capillary: 75 mg/dL (ref 70–99)
Glucose-Capillary: 76 mg/dL (ref 70–99)

## 2024-07-02 LAB — URINALYSIS, COMPLETE (UACMP) WITH MICROSCOPIC
Bacteria, UA: NONE SEEN
Bilirubin Urine: NEGATIVE
Glucose, UA: NEGATIVE mg/dL
Ketones, ur: 20 mg/dL — AB
Leukocytes,Ua: NEGATIVE
Nitrite: NEGATIVE
Protein, ur: 100 mg/dL — AB
Specific Gravity, Urine: 1.013 (ref 1.005–1.030)
pH: 7 (ref 5.0–8.0)

## 2024-07-02 LAB — BLOOD GAS, VENOUS
Acid-Base Excess: 7.4 mmol/L — ABNORMAL HIGH (ref 0.0–2.0)
Bicarbonate: 32 mmol/L — ABNORMAL HIGH (ref 20.0–28.0)
O2 Saturation: 58.3 %
Patient temperature: 36.9
pCO2, Ven: 44 mmHg (ref 44–60)
pH, Ven: 7.47 — ABNORMAL HIGH (ref 7.25–7.43)
pO2, Ven: 33 mmHg (ref 32–45)

## 2024-07-02 LAB — MRSA NEXT GEN BY PCR, NASAL: MRSA by PCR Next Gen: NOT DETECTED

## 2024-07-02 LAB — MAGNESIUM: Magnesium: 1.4 mg/dL — ABNORMAL LOW (ref 1.7–2.4)

## 2024-07-02 LAB — PROCALCITONIN: Procalcitonin: 3.51 ng/mL

## 2024-07-02 MED ORDER — SODIUM CHLORIDE 0.9 % IV SOLN: INTRAVENOUS | Status: DC

## 2024-07-02 MED ORDER — VANCOMYCIN HCL 1250 MG/250ML IV SOLN
1250.0000 mg | INTRAVENOUS | Status: DC
  Administered 2024-07-03: 1250 mg via INTRAVENOUS
  Filled 2024-07-02: qty 250

## 2024-07-02 MED ORDER — DEXTROSE 50 % IV SOLN
INTRAVENOUS | Status: AC
  Filled 2024-07-02: qty 50

## 2024-07-02 MED ORDER — RISAQUAD PO CAPS
1.0000 | ORAL_CAPSULE | Freq: Three times a day (TID) | ORAL | Status: DC
  Filled 2024-07-02: qty 1

## 2024-07-02 MED ORDER — NYSTATIN 100000 UNIT/ML MT SUSP
5.0000 mL | Freq: Three times a day (TID) | OROMUCOSAL | Status: DC
  Administered 2024-07-03 (×2): 500000 [IU] via ORAL
  Filled 2024-07-02 (×4): qty 5

## 2024-07-02 MED ORDER — LORAZEPAM 2 MG/ML IJ SOLN
1.0000 mg | Freq: Four times a day (QID) | INTRAMUSCULAR | Status: DC | PRN
  Administered 2024-07-02 (×2): 1 mg via INTRAVENOUS
  Filled 2024-07-02 (×2): qty 1

## 2024-07-02 MED ORDER — LOPERAMIDE HCL 2 MG PO CAPS: 2.0000 mg | ORAL_CAPSULE | ORAL | Status: DC | PRN

## 2024-07-02 MED ORDER — VANCOMYCIN HCL 1500 MG/300ML IV SOLN
1500.0000 mg | Freq: Once | INTRAVENOUS | Status: AC
  Administered 2024-07-02: 1500 mg via INTRAVENOUS
  Filled 2024-07-02: qty 300

## 2024-07-02 MED ORDER — SODIUM CHLORIDE 0.9 % IV SOLN
500.0000 mg | INTRAVENOUS | Status: AC
  Administered 2024-07-02 – 2024-07-04 (×3): 500 mg via INTRAVENOUS
  Filled 2024-07-02 (×3): qty 5

## 2024-07-02 MED ORDER — DEXTROSE IN LACTATED RINGERS 5 % IV SOLN: INTRAVENOUS | Status: DC

## 2024-07-02 MED ORDER — NAPHAZOLINE-GLYCERIN 0.012-0.25 % OP SOLN: 1.0000 [drp] | Freq: Four times a day (QID) | OPHTHALMIC | Status: DC | PRN

## 2024-07-02 MED ORDER — SODIUM CHLORIDE 0.9 % IV SOLN: 500.0000 mg | INTRAVENOUS | Status: DC

## 2024-07-02 MED ORDER — DEXTROSE 50 % IV SOLN
12.5000 g | INTRAVENOUS | Status: AC
  Administered 2024-07-02: 12.5 g via INTRAVENOUS

## 2024-07-02 MED ORDER — POLYVINYL ALCOHOL 1.4 % OP SOLN
1.0000 [drp] | OPHTHALMIC | Status: DC | PRN
  Administered 2024-07-02: 1 [drp] via OPHTHALMIC
  Filled 2024-07-02: qty 15

## 2024-07-02 MED ORDER — ENSURE PLUS HIGH PROTEIN PO LIQD: 237.0000 mL | Freq: Two times a day (BID) | ORAL | Status: DC

## 2024-07-02 MED ORDER — MAGNESIUM SULFATE 4 GM/100ML IV SOLN
4.0000 g | Freq: Once | INTRAVENOUS | Status: AC
  Administered 2024-07-02: 4 g via INTRAVENOUS
  Filled 2024-07-02: qty 100

## 2024-07-02 NOTE — Care Management Important Message (Signed)
 Important Message  Patient Details  Name: Phillip Logan MRN: [id number] Date of Birth: 02-04-1946   Important Message Given:  Yes - Medicare IM     Claretta Deed 07/02/2024, 9:14 AM

## 2024-07-02 NOTE — Progress Notes (Signed)
 TRH night cross cover note:   I was notified by the patient's RN that the patient has failed swallow screen this evening, including with attempting to take oral medications.  I subsequently updated patient n.p.o. and ordered an SLP consult for the morning for formal swallow evaluation.  Additionally, RN conveys that most recent CBG is 76. CBG appears to have been in the 70's throughout this afternoon into tonight's shift. Currently on q4h cbg monitoring and NS at 75 cc/hr. With the pt's borderline low blood sugars, now transitioning to NPO status, I changed her IVF's from NS at 75 cc/hr to D5LR at 75 cc/hr. Will continue existing order for q4h cbg monitoring.   Pt's RN also notes the appearance of white patches, cracks associated with pt's tongue.   I subsequently ordered nystatin  swish and spit, with application to tongue/oral mucosa via mouth swab.     Eva Pore, DO Hospitalist

## 2024-07-02 NOTE — Progress Notes (Addendum)
 TRH night cross cover note:   I was notified by the patient's RN that this patient appears confused throughout this night shift relative to last evening while also experiencing some loose stool.  Per chart review, this is a 78 year old male with history of chronic dysarthria who was admitted 5 days ago with acute encephalopathy in the setting of septic shock related to multifocal pneumonia, for which he has been receiving broad-spectrum IV antibiotics, most recently cefepime .  After presenting with initial altered mental status, patient underwent CT head on the day of admission, which showed no evidence of acute infarct or intracranial hemorrhage.  It appears that the patient's mental status had improved with treatment of septic shock, multifocal pneumonia.  Vital signs this evening are notable for the following: Afebrile, mild tachycardia, which is also noted to be present during yesterday afternoon; systolic blood pressures in the 150s; respiratory 20.  Oxygen saturation remains in the high 90s on 2 L nasal cannula, unchanged relative to dayshift.  In assessing the patient's altered mental status this evening, it is noted that his scheduled evening dose of gabapentin  was held in the setting of his altered mental status.  Additionally, he has not received any as needed oxycodone  over the course of the last day.  Will further evaluate this change in mental status with CT head as well as chest x-ray.  For this purpose, I have also expedited the existing orders for morning labs, which included CMP and magnesium  level, to be checked now.  I have also changed existing order for BMP in the morning to a CMP, which will also be checked now, and have ordered urinalysis as well as VBG.   Update: While CT head shows no evidence of acute intracranial process, CXR shows interval development of RUQ infiltrate concerning for pna, which is relative to previously noted bibasilar infiltrates on CXR from 7/5, with  interval treatment via Cefepime .   In light of new pulmonary infiltrate in spite of interval cefepime , now with worsening confusing, with add iv vanc and check mrsa pcr as well as update procalcitonin level.     Eva Pore, DO Hospitalist

## 2024-07-02 NOTE — TOC Progression Note (Signed)
 Transition of Care [facility]) - Progression Note    Patient Details  Name: Phillip Logan MRN: [id number] Date of Birth: [birth year]  Transition of Care [facility]) CM/SW Contact  Inocente GORMAN Kindle, LCSW Phone Number: 07/02/2024, 2:01 PM  Clinical Narrative:    Insurance still pending for [employer].    Expected Discharge Plan: Skilled Nursing Facility Barriers to Discharge: Continued Medical Work up, English as a second language teacher  Expected Discharge Plan and Services In-house Referral: Clinical Social Work   Post Acute Care Choice: Skilled Nursing Facility Living arrangements for the past 2 months: Skilled Nursing Facility, Single Family Home                                       Social Determinants of Health (SDOH) Interventions SDOH Screenings   Food Insecurity: Patient Unable To Answer (07/01/2024)  Housing: Unknown (07/01/2024)  Transportation Needs: No Transportation Needs (07/01/2024)  Utilities: Not At Risk (07/01/2024)  Financial Resource Strain: Low Risk  (11/02/2019)   Received from [facility]  Social Connections: Socially Isolated (07/01/2024)  Tobacco Use: Medium Risk (06/29/2024)    Readmission Risk Interventions     No data to display

## 2024-07-02 NOTE — Progress Notes (Signed)
 Occupational Therapy Treatment Patient Details Name: Phillip Logan MRN: [id number] DOB: 11-Sep-1946 Today's Date: 07/02/2024   History of present illness Pt is a 78 y/o M presenting to ED on 7/5 from [facility] as code stroke with slurred speech and R facial droop, code stroke cancelled. Found to be hypoxemic with bil infiltrates concerning for PNA and hypotension. CT head negative. Admitted for septic shock in the setting of multifocal PNA. PMH includes COPD, HTN, clotting disorder, R facial reconstruction surgery   OT comments  Patient received in supine and required max assist +2 to get to EOB. Once on EOB patient attempting to stand and was assisted up with +2 HHA with patient unable to come to complete standing. Patient continued to attempt standing with decreased performance on each attempt. Patient had moved closer to EOB and was assisted to scoot back on bed with therapist performing face to face technique with knees blocked. Once patient was assisted back to sitting, skin tear was noticed on LLE knee. Patient was assisted back to supine with max assist +2 with mitts reapplied. Nursing notified on skin tear. Patient will benefit from continued inpatient follow up therapy, <3 hours/day.  Acute OT to continue to follow to address established goals to facilitate DC to next venue of care.        If plan is discharge home, recommend the following:  A lot of help with walking and/or transfers;A lot of help with bathing/dressing/bathroom;Direct supervision/assist for medications management;Direct supervision/assist for financial management;Assist for transportation;Help with stairs or ramp for entrance;Supervision due to cognitive status   Equipment Recommendations  Other (comment) (defer)    Recommendations for Other Services      Precautions / Restrictions Precautions Precautions: Fall Precaution/Restrictions Comments: watch O2 Restrictions Weight Bearing Restrictions Per  Provider Order: No       Mobility Bed Mobility Overal bed mobility: Needs Assistance Bed Mobility: Supine to Sit, Sit to Supine     Supine to sit: Max assist, +2 for physical assistance Sit to supine: Max assist, +2 for physical assistance   General bed mobility comments: patient requiring assistance with trunk and BLEs with patient attempting to participate but unable to perform without assistance    Transfers Overall transfer level: Needs assistance Equipment used: 2 person hand held assist Transfers: Sit to/from Stand Sit to Stand: Max assist, +2 physical assistance           General transfer comment: once on EOB patient attempting to stand. max assist +2 provided with patient unable to come to complete stand. Patient scooting towards EOB and stood with one person assist to block knees and scoot hips back in bed     Balance Overall balance assessment: Needs assistance Sitting-balance support: Bilateral upper extremity supported, Feet supported Sitting balance-Leahy Scale: Poor Sitting balance - Comments: mod assist seated on EOB Postural control: Posterior lean Standing balance support: Bilateral upper extremity supported Standing balance-Leahy Scale: Zero Standing balance comment: patient assisted into standing due to patient attempting to stand from EOB with patient unable to come to complete standing                           ADL either performed or assessed with clinical judgement   ADL Overall ADL's : Needs assistance/impaired     Grooming: Wash/dry face;Maximal assistance;Bed level  General ADL Comments: attempted grooming with patient unable to follow command    Extremity/Trunk Assessment              Vision       Perception     Praxis     Communication Communication Communication: Impaired Factors Affecting Communication: Difficulty expressing self;Reduced clarity of speech;Other  (comment) (mostly unintelligible speech)   Cognition Arousal: Alert Behavior During Therapy: Restless Cognition: No family/caregiver present to determine baseline             OT - Cognition Comments: patient unable to follow commands on this date                 Following commands: Impaired Following commands impaired: Follows one step commands inconsistently      Cueing   Cueing Techniques: Verbal cues  Exercises      Shoulder Instructions       General Comments while blocking patient's left knee patient aquired a skin tear    Pertinent Vitals/ Pain       Pain Assessment Pain Assessment: Faces Faces Pain Scale: Hurts a little bit Pain Location: generalized with movement Pain Descriptors / Indicators: Grimacing Pain Intervention(s): Limited activity within patient's tolerance, Monitored during session, Repositioned  Home Living                                          Prior Functioning/Environment              Frequency  Min 2X/week        Progress Toward Goals  OT Goals(current goals can now be found in the care plan section)  Progress towards OT goals: Progressing toward goals  Acute Rehab OT Goals Patient Stated Goal: none stated OT Goal Formulation: Patient unable to participate in goal setting Time For Goal Achievement: 07/14/24 Potential to Achieve Goals: Good ADL Goals Pt Will Perform Upper Body Dressing: with contact guard assist;sitting Pt Will Perform Lower Body Dressing: with contact guard assist;sit to/from stand;sitting/lateral leans Pt Will Transfer to Toilet: with contact guard assist;ambulating;regular height toilet  Plan      Co-evaluation                 AM-PAC OT 6 Clicks Daily Activity     Outcome Measure   Help from another person eating meals?: A Lot Help from another person taking care of personal grooming?: A Lot Help from another person toileting, which includes using toliet, bedpan,  or urinal?: A Lot Help from another person bathing (including washing, rinsing, drying)?: A Lot Help from another person to put on and taking off regular upper body clothing?: A Lot Help from another person to put on and taking off regular lower body clothing?: A Lot 6 Click Score: 12    End of Session Equipment Utilized During Treatment: Gait belt  OT Visit Diagnosis: Unsteadiness on feet (R26.81);Other abnormalities of gait and mobility (R26.89);Muscle weakness (generalized) (M62.81)   Activity Tolerance Patient tolerated treatment well   Patient Left in bed;with call bell/phone within reach;with bed alarm set;with restraints reapplied   Nurse Communication Mobility status;Other (comment) (left knee skin tear)        Time: 8951-8897 OT Time Calculation (min): 14 min  Charges: OT General Charges $OT Visit: 1 Visit OT Treatments $Therapeutic Activity: 8-22 mins  Dick Laine, OTA Acute Rehabilitation Services  Office [phone]   Mihcael Ledee  L Libbey Duce 07/02/2024, 2:15 PM

## 2024-07-02 NOTE — Progress Notes (Signed)
 Skin tear occurred during therapy session-- cleansed, steri strips applied, covered with telfa and foam. MD made aware Phillip Logan

## 2024-07-02 NOTE — Progress Notes (Incomplete)
 TRH night cross cover note:   I was notified by the patient's RN that the patient has failed swallow screen this evening, including with attempting to take oral medications.  I subsequently updated patient n.p.o. and ordered an SLP consult for the morning for formal swallow evaluation.  Additionally, RN conveys that most recent CBG is 76. CBG appears to have been in the 70's throughout this afternoon into tonight's shift. Currently on q4h cbg monitoring and NS    unable to tolerate PO including Rx's. May benefit from Palliative Cons & SLP Cons. HS BG: 76, perhaps consider D5 infusion (vs. NS @ 44mL/h). Tongue w/ white patches & cracked. Multiple comorbidties. ***  'll order SLP consult for the AM and convey potential palliative care consult to the rounding hospitalist. will take a look at his IVF's shortly. could he handle nystatin  swish and spit? ***   Eva Pore, DO Hospitalist

## 2024-07-02 NOTE — Progress Notes (Signed)
 Progress Note   Patient: Phillip Logan FMW:[id number] DOB: 1946-10-01 DOA: 06/26/2024  DOS: the patient was seen and examined on 07/02/2024   Brief hospital course:  78 years old male with past medical history of multiple comorbidities, admitted to the hospital for management of septic shock in the setting of multifocal pneumonia, transferred out of the ICU on 06/28/2024.  He is a resident of [facility].    Assessment and Plan:   Acute on chronic hypoxic respiratory failure - Acutely worse overnight.  Currently on 4 L nasal cannula this morning. To wean O2 as tolerated  Septic shock - Shock resolved, transition out of ICU 7/7.  Acute metabolic encephalopathy - Patient appears more obtunded/lethargic.  Worsening O2 overnight, suspicion for worsening pneumonia, possible aspiration.  Attempted treating as below.  Monitor function closely.   Multifocal pneumonia - Repeat imaging personally reviewed overnight noting slight worsening in the right upper lobe.  Patient more rhonchorous and hypoxic this morning.  Concern for worsening pneumonia or aspiration.  My provider initiated vancomycin .  Will also initiate azithromycin  500 mg daily x 3 days to cover atypicals.  Nebulizers, wean O2 as above.     Hypokalemia with severe hypomagnesemia - Potassium improved but magnesium  still low despite replenishment.  Will order 4g IV magnesium , discontinue 400 mg p.o. magnesium  twice daily.  Will recheck BMP and magnesium  in AM.  Acute diarrhea - Likely secondary to antibiotic use.  Had couple of loose stools since yesterday.  Loperamide  as needed.  Will order probiotic.  If diarrhea persists, may warrant check for C. difficile.   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  Patient at baseline.   Acute kidney injury - Likely initially ATN in the setting of septic shock.  Now a bit dehydrated secondary to poor p.o. intake.  Showing improvement with IV fluid  hydration.  Recheck BMP in AM.   Acute on chronic normocytic anemia - Status post 1 unit PRBC 7/7.  Globin stable.  No active bleeding appreciated.  Likely anemia of chronic disease/inflammation.  Will recheck CBC in AM.   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   Physical debilitation muscle weakness - Previously at short-term rehab.  PT/OT on board recommending same.   Arthritic hand pain - Chronic appearing.  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  Voltaren  gel on board.  Skin tear lower extremity - Current during therapy session.  Cleaned, Steri-Strips applied, covered with bandage.  Change dressing daily.   Goals of care - Working closely with TOC on disposition back to [facility].  Pending insurance authorization.  Anticipate discharge later this week.   Subjective: Patient appears worse this morning.  Apparently overnight had worsening hypoxia and lethargy.  This morning on 4 L nasal cannula, more lethargic.  Had episodes of diarrhea as well.  Physical Exam:  Vitals:   07/02/24 0455 07/02/24 0802 07/02/24 0825 07/02/24 1138  BP: (!) 149/92 136/86  131/80  Pulse: (!) 101 (!) 112 (!) 108 (!) 105  Resp: 18 18 18 18   Temp: 98.6 F (37 C) 98.1 F (36.7 C) 98.1 F (36.7 C) 98.1 F (36.7 C)  TempSrc:  Oral  Oral  SpO2: 96% 95% 95% 93%  Weight: 73.8 kg     Height:        GENERAL: Lethargic but rousable, ill-appearing, disheveled, frail HEENT:  EOMI, dysarthria, right neck distended (chronic) CARDIOVASCULAR:  Regular and tachycardic, no murmurs appreciated RESPIRATORY: Bilateral rhonchi  GASTROINTESTINAL:  Soft, nontender, nondistended EXTREMITIES: Atrophy in hands, no LE edema bilaterally NEURO:  No new focal deficits appreciated SKIN: Scattered ecchymosis, skin tear lower extremity PSYCH: Encephalopathic   Data Reviewed:  Imaging Studies: CT HEAD  WO CONTRAST ( ) Result Date: 07/02/2024 CLINICAL DATA:  Initial evaluation for acute mental status change. EXAM: CT HEAD WITHOUT CONTRAST TECHNIQUE: Contiguous axial images were obtained from the base of the skull through the vertex without intravenous contrast. RADIATION DOSE REDUCTION: This exam was performed according to the departmental dose-optimization program which includes automated exposure control, adjustment of the mA and/or kV according to patient size and/or use of iterative reconstruction technique. COMPARISON:  CT from 06/26/2024. FINDINGS: Brain: Generalized age-related cerebral atrophy. Patchy hypodensity involving the supratentorial cerebral white matter, consistent with chronic small vessel ischemic disease, moderately advanced in nature. No acute intracranial hemorrhage. No acute large vessel territory infarct. No mass lesion, midline shift or mass effect. No hydrocephalus or extra-axial fluid collection. Vascular: No abnormal hyperdense vessel. Scattered vascular calcifications noted within the carotid siphons. Skull: Scalp soft tissues demonstrate no acute finding. Erosive lesion at the left skull vertex again noted. Additional possible small lytic lesion measuring approximately 8 mm at the right frontal calvarium (series 7, image 9). Sinuses/Orbits: Streak artifact again noted emanating from the right globe. No acute finding. Mild moderate mucosal thickening about the left sphenoid sinus. Paranasal sinuses are otherwise clear. Small right mastoid effusion, stable from prior, suspected to be chronic. Other: Postoperative changes with multiple surgical clips partially visualized along the right temporalis muscle and within the right neck. IMPRESSION: 1. No acute intracranial abnormality. 2. 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 3. Erosive lesion at the left skull vertex, within additional small lytic lesion at the right frontal calvarium.  Findings are nonspecific, but could reflect small osseous metastases. Correlation with dedicated bone scan could be performed for further evaluation as warranted. Electronically Signed   By: Morene Hoard M.D.   On: 07/02/2024 03:10   DG Chest Port 1 View Result Date: 07/02/2024 CLINICAL DATA:  Altered mental status EXAM: PORTABLE CHEST 1 VIEW COMPARISON:  06/26/2024 FINDINGS: Shallow inspiration. Heart size is normal for technique. Airspace infiltrates in the right upper lung developing since prior study. This may indicate pneumonia. Previous basilar infiltrates are less prominent. No pleural effusion or pneumothorax. Mediastinal contours appear intact. Postoperative changes in the lower cervical spine and neck. IMPRESSION: Developing infiltration in the right upper lung suggesting pneumonia. Electronically Signed   By: Elsie Gravely M.D.   On: 07/02/2024 02:57   ECHOCARDIOGRAM COMPLETE Result Date: 06/26/2024    ECHOCARDIOGRAM REPORT   Patient Name:   Kazuma CORNELIOUS Trentman Date of Exam: 06/26/2024 Medical Rec #:  [id number]                Height:       67.0 in Accession #:    [id number]               Weight:       177.2 lb Date of Birth:  Jul 23, 1946                 BSA:          1.921 m Patient Age:    77 years                 BP:  75/47 mmHg Patient Gender: M                        HR:           115 bpm. Exam Location:  Inpatient Procedure: 2D Echo, Cardiac Doppler and Color Doppler (Both Spectral and Color            Flow Doppler were utilized during procedure). Indications:    Shock  History:        Patient has no prior history of Echocardiogram examinations.  Sonographer:    Therisa Crouch Referring Phys: [phone] MATTHEW R HUNSUCKER IMPRESSIONS  1. Left ventricular ejection fraction, by estimation, is 55 to 60%. The left ventricle has normal function. The left ventricle has no regional wall motion abnormalities. Left ventricular diastolic parameters were normal.  2. Right ventricular  systolic function is mildly reduced. The right ventricular size is normal. Tricuspid regurgitation signal is inadequate for assessing PA pressure.  3. Right atrial size was mildly dilated.  4. The mitral valve is degenerative. Mild mitral valve regurgitation.  5. The aortic valve was not well visualized, probably trilealet. There is moderate calcification of the aortic valve. Aortic valve regurgitation is trivial. Moderate aortic valve stenosis. Aortic valve area, by VTI measures 1.28 cm. Aortic valve mean gradient measures 29.0 mmHg. Dimentionless index 0.41.  6. The inferior vena cava is normal in size with greater than 50% respiratory variability, suggesting right atrial pressure of 3 mmHg. Comparison(s): No prior Echocardiogram. FINDINGS  Left Ventricle: Left ventricular ejection fraction, by estimation, is 55 to 60%. The left ventricle has normal function. The left ventricle has no regional wall motion abnormalities. The left ventricular internal cavity size was normal in size. There is  borderline left ventricular hypertrophy. Left ventricular diastolic parameters were normal. Right Ventricle: The right ventricular size is normal. No increase in right ventricular wall thickness. Right ventricular systolic function is mildly reduced. Tricuspid regurgitation signal is inadequate for assessing PA pressure. Left Atrium: Left atrial size was normal in size. Right Atrium: Right atrial size was mildly dilated. Pericardium: There is no evidence of pericardial effusion. Presence of epicardial fat layer. Mitral Valve: The mitral valve is degenerative in appearance. There is mild calcification of the mitral valve leaflet(s). Mild mitral annular calcification. Mild mitral valve regurgitation. Tricuspid Valve: The tricuspid valve is grossly normal. Tricuspid valve regurgitation is mild. Aortic Valve: The aortic valve was not well visualized. There is moderate calcification of the aortic valve. There is moderate aortic  valve annular calcification. Aortic valve regurgitation is trivial. Moderate aortic stenosis is present. Aortic valve mean gradient measures 29.0 mmHg. Aortic valve peak gradient measures 53.7 mmHg. Aortic valve area, by VTI measures 1.28 cm. Pulmonic Valve: The pulmonic valve was not well visualized. Pulmonic valve regurgitation is trivial. Aorta: The aortic root and ascending aorta are structurally normal, with no evidence of dilitation. Venous: The inferior vena cava is normal in size with greater than 50% respiratory variability, suggesting right atrial pressure of 3 mmHg. IAS/Shunts: No atrial level shunt detected by color flow Doppler. Additional Comments: 3D was performed not requiring image post processing on an independent workstation and was indeterminate.  LEFT VENTRICLE PLAX 2D LVIDd:         5.10 cm      Diastology LVIDs:         3.90 cm      LV e' medial:    12.90 cm/s LV PW:  1.00 cm      LV E/e' medial:  6.3 LV IVS:        0.90 cm      LV e' lateral:   18.20 cm/s LVOT diam:     2.00 cm      LV E/e' lateral: 4.5 LV SV:         68 LV SV Index:   35 LVOT Area:     3.14 cm  LV Volumes (MOD) LV vol d, MOD A2C: 119.0 ml LV vol d, MOD A4C: 71.1 ml LV vol s, MOD A2C: 45.6 ml LV vol s, MOD A4C: 24.3 ml LV SV MOD A2C:     73.4 ml LV SV MOD A4C:     71.1 ml LV SV MOD BP:      63.9 ml RIGHT VENTRICLE             IVC RV Basal diam:  4.30 cm     IVC diam: 1.30 cm RV S prime:     16.30 cm/s TAPSE (M-mode): 2.2 cm LEFT ATRIUM           Index        RIGHT ATRIUM           Index LA diam:      3.20 cm 1.67 cm/m   RA Area:     21.90 cm LA Vol (A4C): 46.9 ml 24.41 ml/m  RA Volume:   66.20 ml  34.46 ml/m  AORTIC VALVE AV Area (Vmax):    1.18 cm AV Area (Vmean):   1.25 cm AV Area (VTI):     1.28 cm AV Vmax:           366.50 cm/s AV Vmean:          246.250 cm/s AV VTI:            0.526 m AV Peak Grad:      53.7 mmHg AV Mean Grad:      29.0 mmHg LVOT Vmax:         138.00 cm/s LVOT Vmean:        97.800 cm/s  LVOT VTI:          0.215 m LVOT/AV VTI ratio: 0.41  AORTA Ao Root diam: 2.80 cm Ao Asc diam:  3.20 cm MITRAL VALVE MV Area (PHT): 3.61 cm    SHUNTS MV Decel Time: 210 msec    Systemic VTI:  0.22 m MV E velocity: 81.20 cm/s  Systemic Diam: 2.00 cm MV A velocity: 92.80 cm/s MV E/A ratio:  0.88 Jayson Sierras MD Electronically signed by Jayson Sierras MD Signature Date/Time: 06/26/2024/3:58:18 PM    Final    DG Chest Port 1 View Result Date: 06/26/2024 CLINICAL DATA:  Questionable sepsis - evaluate for abnormality EXAM: PORTABLE CHEST 1 VIEW COMPARISON:  Radiograph 06/12/2024 and 05/31/2024.  CT 05/25/2024. FINDINGS: 1111 hours. The heart size and mediastinal contours are stable. There are progressive basilar predominant airspace opacities in both lungs with poor definition of the pulmonary vasculature. No pneumothorax or significant pleural effusion. The bones appear unchanged status post multilevel cervical fusion. Telemetry leads overlie the chest. IMPRESSION: Progressive basilar predominant airspace opacities in both lungs, suspicious for multifocal pneumonia. Electronically Signed   By: Elsie Perone M.D.   On: 06/26/2024 12:10   CT ANGIO HEAD NECK W WO CM W PERF (CODE STROKE) Result Date: 06/26/2024 CLINICAL DATA:  78 year old male code stroke presentation. Abnormal speech, right facial droop. EXAM: CT ANGIOGRAPHY HEAD AND  NECK CT PERFUSION BRAIN TECHNIQUE: Multidetector CT imaging of the head and neck was performed using the standard protocol during bolus administration of intravenous contrast. Multiplanar CT image reconstructions and MIPs were obtained to evaluate the vascular anatomy. Carotid stenosis measurements (when applicable) are obtained utilizing NASCET criteria, using the distal internal carotid diameter as the denominator. Multiphase CT imaging of the brain was performed following IV bolus contrast injection. Subsequent parametric perfusion maps were calculated using RAPID software. RADIATION  DOSE REDUCTION: This exam was performed according to the departmental dose-optimization program which includes automated exposure control, adjustment of the mA and/or kV according to patient size and/or use of iterative reconstruction technique. CONTRAST:  Not specified. COMPARISON:  Head CT 1043 hours today. FINDINGS: CT Brain Perfusion Findings: ASPECTS: 10 CBF (<30%) Volume: 0mL Perfusion (Tmax>6.0s) volume: 0mL Mismatch Volume: Not applicable Infarction Location:Not applicable CTA NECK Skeleton: Cervical ACDF C4 through C7 with evidence of solid arthrodesis. Developing degenerative interbody ankylosis at C3-C4. Carious dentition. Redemonstrated left vertex skull outer table erosion series 5, image 6. No other acute or suspicious osseous lesion identified. Upper chest: Multifocal upper lung peribronchial spiculated and ground-glass opacity greater on the right (series 5, image 163) up to 3 cm diameter, nonspecific. Small layering left pleural effusion. No superior mediastinal lymphadenopathy identified. Other neck: Previous right neck dissection. Surgically absent submandibular and parotid glands. Numerous right neck surgical clips. Right IJ remains in place. Right sternocleidomastoid muscle appears small but in place. No superimposed neck mass or lymphadenopathy is identified. Aortic arch: Calcified aortic atherosclerosis.  Three vessel arch. Right carotid system: Brachiocephalic artery plaque with up to 50 % stenosis with respect to the distal vessel. Right CCA origin relatively spared. Soft and calcified plaque through the right carotid bifurcation without significant stenosis. Bulky calcified plaque right ICA bulb with less than 50 % stenosis with respect to the distal vessel. Left carotid system: Motion artifact. Left CCA origin plaque within unclear degree of subsequent stenosis. Left CCA remains patent. Anterior vessel soft and calcified plaque before the bifurcation without stenosis. Calcified plaque at  the left ICA origin and bulb without stenosis. Tortuosity below the skull base. Vertebral arteries: Proximal right subclavian artery and right vertebral artery origin patent with only mild plaque and no significant stenosis. Non dominant appearing right vertebral artery remains patent to the skull base. Bulky left subclavian origin plaque although detail degraded by motion. Hemodynamically significant stenosis suspected on series 10, image 42. Left subclavian and left vertebral artery origin remain patent. Mild left vertebral origin calcified plaque without stenosis. Dominant left vertebral artery patent to the skull base without stenosis. CTA HEAD Posterior circulation: Dominant left vertebral artery with mild calcified the 4 segment plaque and no stenosis to the vertebrobasilar junction. Diminutive right V4 segment but remains patent to the basilar. Bilateral AICA appear dominant and patent. Patent basilar artery without stenosis. Tortuous basilar. Patent SCA and PCA origins. Posterior communicating arteries are diminutive or absent. Moderate to severe left and moderate right P1 segment irregularity and stenosis on series 7, image 18. Additional moderate to severe left P2 segment stenosis on series 9, image 19. Moderate additional multifocal right P2 segment stenosis on series 9, image 14. Maintained bilateral distal PCA enhancement. Anterior circulation: Both ICA siphons are patent. Calcified left cavernous and supraclinoid segment plaque with only mild stenosis. Similar calcified right siphon plaque with only mild stenosis. Patent carotid termini. Patent MCA and ACA origins. Normal anterior communicating artery. Bilateral ACA branches are within normal limits. Left MCA M1  segment and trifurcation are patent without stenosis. Right MCA M1 segment and trifurcation are patent without stenosis. Bilateral MCA branches are within normal limits. Venous sinuses: Patent. Anatomic variants: Dominant left vertebral artery.  Review of the MIP images confirms the above findings IMPRESSION: 1. Negative CT Perfusion. 2. Abundant atherosclerosis at the aortic arch, throughout the head and neck. But negative for large vessel occlusion. Aortic Atherosclerosis (ICD10-I70.0). Notable stenoses: -  Moderate to Severe bilateral PCA P1 and P2. - motion artifact but probably hemodynamically significant Left Subclavian Origin stenosis. And similar plaque but unclear degree of stenosis at the Left CCA origin. 3. Nonspecific abnormal right greater than left upper lobe lung peribronchial opacity, mildly spiculated. Small layering left pleural effusion. 4. Previous Right neck dissection. New or increased left vertex outer table skull erosion from Head CT last month. But no other mass or metastatic disease identified in the Neck. Electronically Signed   By: VEAR Hurst M.D.   On: 06/26/2024 11:32   CT HEAD CODE STROKE WO CONTRAST Result Date: 06/26/2024 CLINICAL DATA:  Code stroke.  78 year old male. EXAM: CT HEAD WITHOUT CONTRAST TECHNIQUE: Contiguous axial images were obtained from the base of the skull through the vertex without intravenous contrast. RADIATION DOSE REDUCTION: This exam was performed according to the departmental dose-optimization program which includes automated exposure control, adjustment of the mA and/or kV according to patient size and/or use of iterative reconstruction technique. COMPARISON:  Head CT 05/31/2024. FINDINGS: Brain: No midline shift, ventriculomegaly, mass effect, evidence of mass lesion, intracranial hemorrhage or evidence of cortically based acute infarction. Streak artifact from the right globe. Patchy and confluent bilateral cerebral white matter hypodensity with some deep white matter capsule involvement which is fairly symmetric on both sides. No cytotoxic edema is identified. Vascular: Calcified atherosclerosis at the skull base. No suspicious intracranial vascular hyperdensity. Skull: There is a new or increased  vertex skull erosion on the left, series 4, image 87 and encompassing an area of about 2 cm. Other visible bone mineralization within normal limits. No other acute osseous abnormality identified. Sinuses/Orbits: Right ethmoid and left sphenoid sinus mucosal thickening not significantly changed. Other: Chronic metal artifact along the anterior right globe, multiple surgical clips in the visible right face, along the right temporalis muscle. ASPECTS [facility] Stroke Program Early CT Score) Total score (0-10 with 10 being normal): 10 IMPRESSION: 1. No acute cortically based infarct or intracranial hemorrhage identified. ASPECTS 10. 2. New left vertex skull erosion since 05/31/2024, suspicious for a metastatic lesion. These results were communicated to Dr. Michaela at 10:50 am on 06/26/2024 by text page via the [facility] messaging system. Electronically Signed   By: VEAR Hurst M.D.   On: 06/26/2024 10:51    Chest x-ray personally reviewed noting worsening right upper lobe patchiness/consolidation with persistent bilateral lower lobe patchiness.  Previous records (including but not limited to H&P, progress notes, nursing notes, TOC management) were reviewed in assessment of this patient.  Labs: CBC: Recent Labs  Lab 06/26/24 1036 06/26/24 1037 06/27/24 0625 06/28/24 0410 06/28/24 2136 06/30/24 0744 07/01/24 0429 07/02/24 0327  WBC 34.9*   < > 28.1* 14.3*  --  15.5* 13.4* 15.9*  NEUTROABS 31.6*  --   --   --   --  13.3*  --  13.3*  HGB 9.1*   < > 7.7* 6.5* 8.0* 10.6* 9.3* 11.4*  HCT 28.9*   < > 24.7* 21.2* 25.4* 33.2* 29.4* 35.5*  MCV 92.6   < > 91.8 91.0  --  89.0 88.3 88.1  PLT 272   < > 253 193  --  241 209 221   < > = values in this interval not displayed.   Basic Metabolic Panel: Recent Labs  Lab 06/27/24 0625 06/27/24 2000 06/28/24 0410 06/30/24 0744 07/01/24 0429 07/02/24 0327  NA 133*  --  132* 136 133* 137  K 4.0  --  4.0 3.1* 2.9* 3.6  CL 100  --  98 94* 93* 95*  CO2 24  --  25 28  30 26   GLUCOSE 95  --  95 68* 84 74  BUN 32*  --  28* 16 14 10   CREATININE 1.33*  --  1.19 0.99 0.93 0.96  CALCIUM 8.0*  --  8.3* 8.5* 8.0* 8.6*  MG 1.3* 2.6* 2.4  --  1.1* 1.4*  PHOS 3.5  --  2.2*  --   --   --    Liver Function Tests: Recent Labs  Lab 06/26/24 1036 07/02/24 0327  AST 24 39  ALT 18 34  ALKPHOS 67 124  BILITOT 0.9 1.7*  PROT 5.6* 6.5  ALBUMIN  1.9* 2.4*   CBG: Recent Labs  Lab 07/02/24 0140 07/02/24 0456 07/02/24 0825 07/02/24 0916 07/02/24 1108  GLUCAP 73 73 64* 124* 87    Scheduled Meds:  Chlorhexidine  Gluconate Cloth  6 each Topical Daily   colchicine   0.6 mg Oral BID   dextrose        diclofenac  Sodium  2 g Topical QID   gabapentin   400 mg Oral BID   Gerhardt's butt cream   Topical BID   heparin   5,000 Units Subcutaneous Q8H   insulin  aspart  0-9 Units Subcutaneous Q4H   nicotine   14 mg Transdermal Daily   sodium chloride  flush  10-40 mL Intracatheter Q12H   sodium chloride  flush  3 mL Intravenous Once   Continuous Infusions:  sodium chloride      azithromycin  500 mg (07/02/24 0906)   ceFEPime  (MAXIPIME ) IV 2 g (07/02/24 0428)   [START ON 07/03/2024] vancomycin      PRN Meds:.acetaminophen , albuterol , dextrose , docusate sodium , guaiFENesin , loperamide , metoprolol  tartrate, ondansetron  (ZOFRAN ) IV, mouth rinse, oxyCODONE , polyethylene glycol, sodium chloride  flush  Family Communication: None at bedside  Disposition: Status is: Inpatient Remains inpatient appropriate because: Respiratory failure, pneumonia     Time spent: 52 minutes  Length of inpatient stay: 6 days  Author: Carliss LELON Canales, DO 07/02/2024 12:36 PM  For on call review [email or web address].

## 2024-07-02 NOTE — Progress Notes (Signed)
 Hypoglycemic Event  CBG: 64   Treatment:  pt refused po intake. IV dextrose  given Symptoms: Nervous/irritable  Follow-up CBG: Time:0917  CBG Result:124   Possible Reasons for Event: Inadequate meal intake  Comments/MD notified:yes    Rosina JINNY Dross

## 2024-07-02 NOTE — Plan of Care (Signed)
  Problem: Coping: Goal: Ability to adjust to condition or change in health will improve Outcome: Progressing   Problem: Skin Integrity: Goal: Risk for impaired skin integrity will decrease Outcome: Progressing   Problem: Tissue Perfusion: Goal: Adequacy of tissue perfusion will improve Outcome: Progressing   Problem: Clinical Measurements: Goal: Will remain free from infection Outcome: Progressing Goal: Diagnostic test results will improve Outcome: Progressing Goal: Respiratory complications will improve Outcome: Progressing Goal: Cardiovascular complication will be avoided Outcome: Progressing   Problem: Activity: Goal: Risk for activity intolerance will decrease Outcome: Progressing   Problem: Elimination: Goal: Will not experience complications related to urinary retention Outcome: Progressing   Problem: Safety: Goal: Ability to remain free from injury will improve Outcome: Progressing   Problem: Skin Integrity: Goal: Risk for impaired skin integrity will decrease Outcome: Progressing

## 2024-07-02 NOTE — Progress Notes (Signed)
 Pharmacy Antibiotic Note  Phillip Logan is a 78 y.o. male with AMS and possible HCAP.  Pharmacy has been consulted for Vancomycin  dosing.  Plan: Vancomycin  1500 mg IV now, then 1250 mg IV q24h  Height: 5' 7 (170.2 cm) Weight: 83.2 kg (183 lb 6.8 oz) IBW/kg (Calculated) : 66.1  Temp (24hrs), Avg:98.3 F (36.8 C), Min:97.6 F (36.4 C), Max:99 F (37.2 C)  Recent Labs  Lab 06/26/24 1042 06/26/24 1407 06/26/24 1800 06/27/24 0625 06/28/24 0410 06/30/24 0744 07/01/24 0429 07/02/24 0327  WBC  --    < >  --  28.1* 14.3* 15.5* 13.4* 15.9*  CREATININE  --    < > 1.45* 1.33* 1.19 0.99 0.93  --   LATICACIDVEN 1.5  --   --   --   --   --   --   --    < > = values in this interval not displayed.    Estimated Creatinine Clearance: 68.6 mL/min (by C-G formula based on SCr of 0.93 mg/dL).    Allergies  Allergen Reactions   Ciprofloxacin Other (See Comments)    Not listed on MAR    Clindamycin/Lincomycin Other (See Comments)    Not listed on MAR    Clonidine Derivatives Other (See Comments)    Not listed on MAR   Cyproheptadine Other (See Comments)    Not listed on MAR   Lisinopril Other (See Comments)    Not listed on MAR   Myrbetriq [Mirabegron] Other (See Comments)    Not listed on MAR   Penicillins Other (See Comments)    Not listed on MAR   Streptokinases Other (See Comments)    Not listed on [facility]    Phillip Logan 07/02/2024 3:50 AM

## 2024-07-03 ENCOUNTER — Inpatient Hospital Stay (HOSPITAL_COMMUNITY)

## 2024-07-03 DIAGNOSIS — A419 Sepsis, unspecified organism: Secondary | ICD-10-CM | POA: Diagnosis not present

## 2024-07-03 DIAGNOSIS — R6521 Severe sepsis with septic shock: Secondary | ICD-10-CM | POA: Diagnosis not present

## 2024-07-03 LAB — BLOOD GAS, ARTERIAL
Acid-Base Excess: 0.9 mmol/L (ref 0.0–2.0)
Acid-Base Excess: 2.1 mmol/L — ABNORMAL HIGH (ref 0.0–2.0)
Bicarbonate: 24 mmol/L (ref 20.0–28.0)
Bicarbonate: 25.5 mmol/L (ref 20.0–28.0)
Drawn by: 418751
O2 Saturation: 94.7 %
O2 Saturation: 91.2 %
Patient temperature: 36.9
Patient temperature: 37.3
pCO2 arterial: 33 mmHg (ref 32–48)
pCO2 arterial: 35 mmHg (ref 32–48)
pH, Arterial: 7.47 — ABNORMAL HIGH (ref 7.35–7.45)
pH, Arterial: 7.47 — ABNORMAL HIGH (ref 7.35–7.45)
pO2, Arterial: 63 mmHg — ABNORMAL LOW (ref 83–108)
pO2, Arterial: 58 mmHg — ABNORMAL LOW (ref 83–108)

## 2024-07-03 LAB — MAGNESIUM: Magnesium: 1.9 mg/dL (ref 1.7–2.4)

## 2024-07-03 LAB — GLUCOSE, CAPILLARY
Glucose-Capillary: 87 mg/dL (ref 70–99)
Glucose-Capillary: 92 mg/dL (ref 70–99)
Glucose-Capillary: 104 mg/dL — ABNORMAL HIGH (ref 70–99)
Glucose-Capillary: 100 mg/dL — ABNORMAL HIGH (ref 70–99)
Glucose-Capillary: 84 mg/dL (ref 70–99)
Glucose-Capillary: 88 mg/dL (ref 70–99)

## 2024-07-03 LAB — BASIC METABOLIC PANEL WITH GFR
Anion gap: 23 — ABNORMAL HIGH (ref 5–15)
BUN: 16 mg/dL (ref 8–23)
CO2: 20 mmol/L — ABNORMAL LOW (ref 22–32)
Calcium: 8.8 mg/dL — ABNORMAL LOW (ref 8.9–10.3)
Chloride: 96 mmol/L — ABNORMAL LOW (ref 98–111)
Creatinine, Ser: 1.1 mg/dL (ref 0.61–1.24)
GFR, Estimated: >60 mL/min (ref >60)
Glucose, Bld: 95 mg/dL (ref 70–99)
Potassium: 3.2 mmol/L — ABNORMAL LOW (ref 3.5–5.1)
Sodium: 139 mmol/L (ref 135–145)

## 2024-07-03 LAB — CBC
HCT: 39.3 % (ref 39.0–52.0)
Hemoglobin: 12.3 g/dL — ABNORMAL LOW (ref 13.0–17.0)
MCH: 27.6 pg (ref 26.0–34.0)
MCHC: 31.3 g/dL (ref 30.0–36.0)
MCV: 88.3 fL (ref 80.0–100.0)
Platelets: 299 K/uL (ref 150–400)
RBC: 4.45 MIL/uL (ref 4.22–5.81)
RDW: 15.7 % — ABNORMAL HIGH (ref 11.5–15.5)
WBC: 13.1 K/uL — ABNORMAL HIGH (ref 4.0–10.5)
nRBC: 0 % (ref 0.0–0.2)

## 2024-07-03 LAB — PROCALCITONIN: Procalcitonin: 2.73 ng/mL

## 2024-07-03 LAB — BRAIN NATRIURETIC PEPTIDE: B Natriuretic Peptide: 2307.5 pg/mL — ABNORMAL HIGH (ref 0.0–100.0)

## 2024-07-03 MED ORDER — POTASSIUM CHLORIDE 10 MEQ/100ML IV SOLN
10.0000 meq | INTRAVENOUS | Status: AC
  Administered 2024-07-03 (×4): 10 meq via INTRAVENOUS
  Filled 2024-07-03 (×4): qty 100

## 2024-07-03 MED ORDER — FUROSEMIDE 10 MG/ML IJ SOLN
40.0000 mg | Freq: Two times a day (BID) | INTRAMUSCULAR | Status: DC
  Administered 2024-07-03 – 2024-07-04 (×4): 40 mg via INTRAVENOUS
  Filled 2024-07-03 (×4): qty 4

## 2024-07-03 MED ORDER — FUROSEMIDE 10 MG/ML IJ SOLN
40.0000 mg | Freq: Once | INTRAMUSCULAR | Status: AC
  Administered 2024-07-03: 40 mg via INTRAVENOUS
  Filled 2024-07-03: qty 4

## 2024-07-03 MED ORDER — SODIUM CHLORIDE 0.9 % IV SOLN
2.0000 g | INTRAVENOUS | Status: DC
  Administered 2024-07-03 – 2024-07-05 (×3): 2 g via INTRAVENOUS
  Filled 2024-07-03 (×3): qty 20

## 2024-07-03 MED ORDER — METRONIDAZOLE 500 MG/100ML IV SOLN
500.0000 mg | Freq: Two times a day (BID) | INTRAVENOUS | Status: DC
  Administered 2024-07-03 – 2024-07-05 (×6): 500 mg via INTRAVENOUS
  Filled 2024-07-03 (×6): qty 100

## 2024-07-03 NOTE — TOC Progression Note (Signed)
 Transition of Care [facility]) - Progression Note    Patient Details  Name: Phillip Logan MRN: [id number] Date of Birth: Oct 17, 1946  Transition of Care [facility] CM/SW Contact  Inocente GORMAN Kindle, LCSW Phone Number: 07/03/2024, 8:55 AM  Clinical Narrative:    CSW received request from insurance for updated therapy notes. CSW uploaded them to the insurance portal.    Expected Discharge Plan: Skilled Nursing Facility Barriers to Discharge: Continued Medical Work up, English as a second language teacher  Expected Discharge Plan and Services In-house Referral: Clinical Social Work   Post Acute Care Choice: Skilled Nursing Facility Living arrangements for the past 2 months: Skilled Holiday representative, Single Family Home                                       Social Determinants of Health (SDOH) Interventions SDOH Screenings   Food Insecurity: Patient Unable To Answer (07/01/2024)  Housing: Unknown (07/01/2024)  Transportation Needs: No Transportation Needs (07/01/2024)  Utilities: Not At Risk (07/01/2024)  Financial Resource Strain: Low Risk  (11/02/2019)   Received from [facility]  Social Connections: Socially Isolated (07/01/2024)  Tobacco Use: Medium Risk (06/29/2024)    Readmission Risk Interventions     No data to display

## 2024-07-03 NOTE — Progress Notes (Addendum)
 TRH night cross cover note:   I was notified by the patient's RN  that the patient appears slighlty most tachypneic with RR into the mid-20's, with HR now also slightly higher, with most recent HR's sustaining in the 120's, compared to HR's in the low 100's to 1-teens throughout the day. He has a history of paroxysmal atrial fibrillation , also this tachycardia appears sinus on the monitor. BP also now slightly elevated, with systolic values into the 170's mmHg. Patient is afebrile, and most recent O2 sat is noted to be 93% on 2 L [place], compared to O2 sats of 93-100% on 2 L [place] during day shift. Per my discussions with pt's RN, pt appears slightly more agitated at this time, but no verbalizing any specific complaints.   He has received a dose of Lopressor  5 mg IV per existing prn order, with improvement in HR into the 90's mmHg. SBP now into the 160's mmHg.   Chest x-ray last evening showed evidence of new right upper lobe concerning for pneumonia.  He had recently completed a course of cefepime , and is currently on IV vancomycin  and azithromycin .  Given interval increased work of breathing, tachypnea, along with some concern for aspiration, I have added IV Flagyl  to his existing IV antibiotic regimen, and have ordered a updated stat chest x-ray. Patient was made NPO earlier this evening, and has ordered ST consult for formal swallow evaluation in the AM.  While awaiting chest x-ray result, I have held his existing IV fluids (D5LR) and have ordered STAT bnp and procalcitonin levels.    Update: BNP is 2,300, which is up from 295 when checked 1 week ago.  Chest x-ray shows interval increase in right-sided pleural effusion with some evidence of layering, as well as similar appearing interstitial and airspace opacities throughout the right lung compared to CXR last night, while also demonstrating evidence of left basilar opacity, felt to be atelectasis versus infiltrate.  It appears that the patient is on  Lasix  40 mg at home, but only on a as needed basis for peripheral edema.  In light of the worsening pleural effusion as well as nearly 10 fold interval increase in BNP, will hold off on resumption of IVF's and I have ordered Lasix  40 mg iv x 1 dose now.       Eva Pore, DO Hospitalist

## 2024-07-03 NOTE — Progress Notes (Signed)
 SLP Cancellation Note  Patient Details Name: Phillip Logan MRN: [id number] DOB: [birth year]   Cancelled treatment:       Reason Eval/Treat Not Completed: Medical issues which prohibited therapy; nursing deferred d/t pt unresponsive (red MEWS score); being transferred to progressive unit.  ST will continue to f/u as pt able to participate.   Bruna Clause, M.S.,CCC-SLP 07/03/2024, 9:12 AM

## 2024-07-03 NOTE — Plan of Care (Signed)
  Problem: Coping: Goal: Ability to adjust to condition or change in health will improve Outcome: Not Progressing   Problem: Health Behavior/Discharge Planning: Goal: Ability to manage health-related needs will improve Outcome: Not Progressing   Problem: Nutritional: Goal: Maintenance of adequate nutrition will improve Outcome: Not Progressing   Problem: Skin Integrity: Goal: Risk for impaired skin integrity will decrease Outcome: Not Progressing

## 2024-07-03 NOTE — Plan of Care (Signed)
  Problem: Education: Goal: Ability to describe self-care measures that may prevent or decrease complications (Diabetes Survival Skills Education) will improve Outcome: Not Progressing Goal: Individualized Educational Video(s) Outcome: Not Progressing   Problem: Coping: Goal: Ability to adjust to condition or change in health will improve Outcome: Not Progress  Cant comprehend

## 2024-07-03 NOTE — Progress Notes (Addendum)
 PROGRESS NOTE    Phillip Logan  FMW:[id number] DOB: 24-Nov-1946 DOA: 06/26/2024 PCP: Jama Chow, MD   Brief Narrative:  78 years old male with past medical history of multiple comorbidities, admitted to the hospital for management of septic shock in the setting of multifocal pneumonia, transferred out of the ICU on 06/28/2024.  He is a resident of [facility] skilled [facility] facility.   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 - Continue aggressive care, okay for BiPAP, aggressive medications and treatments otherwise - Given patient's recurrent episodes of decline over the past week secondary to likely recurrent aspiration and worsening pulmonary and mental status.  We discussed his high risk for decompensation over the next few days   Acute on chronic hypoxic respiratory failure, multifactorial Septic shock secondary to pneumonia, multifactorial High risk for aspiration, rule out pneumonitis Pleural effusion R>L - Acutely worse overnight after presumed aspiration event. - Imaging confirms effusion, restart IV furosemide , follow I's and O's - Currently on 2 L nasal cannula tolerating well, sats currently well above 90 -wean as tolerated - Patient DNR but did discuss potential needing nonrebreather or BiPAP which wife confirmed would be reasonable.  RT following along, if mental status does not improve will consider ABG to evaluate for hypercarbia.  Septic shock, POA secondary to multifocal pneumonia - Shock resolved, off pressors, transition out of ICU 7/7 -  Recurrent aspiration presumed, continue ceftriaxone  and Flagyl    Acute metabolic encephalopathy - Patient appears more obtunded/lethargic.  Worsening O2 overnight, suspicion for worsening pneumonia, possible aspiration. Monitor function closely. 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  Patient at baseline.   Acute kidney injury - Likely initially ATN in the setting of septic shock.   - Currently within normal limits   Acute on chronic normocytic anemia - Status post 1 unit PRBC 7/7.   - No active bleeding, hemoglobin continues to improve   Recurrent/locally advanced squamous cell skin cancer - S/p chemo, outpatient follow-up with oncology and dermatology.   Physical debilitation muscle weakness - Previously at short-term rehab.  PT/OT on board recommending same.   Arthritic hand pain - Chronic appearing.  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  Voltaren  gel on board.   Skin tear lower extremity - Current during therapy session.  Cleaned, Steri-Strips applied, covered with bandage.  Change dressing daily.   DVT prophylaxis: heparin  injection 5,000 Units Start: 06/26/24 1400 Code Status:   Code Status: Limited: Do not attempt resuscitation (DNR) -DNR-LIMITED -Do Not Intubate/DNI  Family Communication: Wife updated over the phone  Status is: Inpatient  Dispo: The  patient is from: Home              Anticipated d/c  is to: Home              Anticipated d/c date is: To be determined              Patient currently not medically stable for discharge  Consultants:  PCCM  Procedures:  None  Antimicrobials:  Azithromycin , ceftriaxone , metronidazole   Subjective: Overnight patient had acute episode of hypoxia with altered mental status, concerning for aspiration, repeat x-ray shows bilateral airspace disease with worsening right-sided effusion, BNP markedly elevated overnight -Lasix  resumed.  Patient unfortunately unable to corroborate history or give review of systems.  Objective: Vitals:   07/03/24 0544 07/03/24 0644 07/03/24 0911 07/03/24 1114  BP: (!) 169/108 (!) 156/100 122/79 130/80  Pulse: (!) 114  (!) 113 (!) 116  Resp: (!) 22 (!) 24 (!) 22 (!) 24  Temp: 99.2 F (37.3 C) 98.5 F (36.9 C) 99.8 F (37.7 C) 99.5 F (37.5 C)  TempSrc:  Oral Axillary Axillary  SpO2: 94% 99% 95% 97%  Weight:      Height:        Intake/Output Summary (Last 24 hours) at 07/03/2024 1254 Last data filed at 07/03/2024 0600 Gross per 24 hour  Intake 556.63 ml  Output 1250 ml  Net -693.37 ml   Filed Weights   07/01/24 0500 07/02/24 0455 07/03/24 0500  Weight: 83.2 kg 73.8 kg 70.3 kg    Examination:  General: Somnolent gaunt appearing gentleman, poorly arousable but protecting airway HEENT: Pupils equal round reactive to light, otherwise nonicteric, extraocular movements intact Neck:  Without mass or deformity.  Trachea is midline. Lungs: Diffusely rhonchorous with bibasilar rales Heart: Tachycardic, regular rhythm without overt murmurs rubs or gallops. Abdomen:  Soft, nontender, nondistended.  Without guarding or rebound. Extremities: Without notable edema.     Data Reviewed: I have personally reviewed following labs and imaging studies  CBC: Recent Labs  Lab 06/28/24 0410 06/28/24 2136 06/30/24 0744 07/01/24 0429 07/02/24 0327 07/03/24 0800  WBC 14.3*  --  15.5* 13.4* 15.9* 13.1*  NEUTROABS   --   --  13.3*  --  13.3*  --   HGB 6.5* 8.0* 10.6* 9.3* 11.4* 12.3*  HCT 21.2* 25.4* 33.2* 29.4* 35.5* 39.3  MCV 91.0  --  89.0 88.3 88.1 88.3  PLT 193  --  241 209 221 299   Basic Metabolic Panel: Recent Labs  Lab 06/27/24 0625 06/27/24 2000 06/28/24 0410 06/30/24 0744 07/01/24 0429 07/02/24 0327 07/03/24 0800  NA 133*  --  132* 136 133* 137 139  K 4.0  --  4.0 3.1* 2.9* 3.6 3.2*  CL 100  --  98 94* 93* 95* 96*  CO2 24  --  25 28 30 26  20*  GLUCOSE 95  --  95 68* 84 74 95  BUN 32*  --  28* 16 14 10 16   CREATININE 1.33*  --  1.19 0.99 0.93 0.96 1.10  CALCIUM 8.0*  --  8.3* 8.5* 8.0* 8.6* 8.8*  MG 1.3* 2.6* 2.4  --  1.1* 1.4* 1.9  PHOS 3.5  --  2.2*  --   --   --   --    GFR: Estimated Creatinine Clearance: 52.6 mL/min (by C-G formula based on SCr of 1.1 mg/dL). Liver Function Tests: Recent Labs  Lab 07/02/24 0327  AST 39  ALT 34  ALKPHOS 124  BILITOT 1.7*  PROT  6.5  ALBUMIN  2.4*   No results for input(s): LIPASE, AMYLASE in the last 168 hours. No results for input(s): AMMONIA in the last 168 hours. Coagulation Profile: No results for input(s): INR, PROTIME in the last 168 hours.  Cardiac Enzymes: No results for input(s): CKTOTAL, CKMB, CKMBINDEX, TROPONINI in the last 168 hours. BNP (last 3 results) No results for input(s): PROBNP in the last 8760 hours. HbA1C: No results for input(s): HGBA1C in the last 72 hours. CBG: Recent Labs  Lab 07/02/24 2037 07/03/24 0107 07/03/24 0417 07/03/24 0810 07/03/24 1227  GLUCAP 76 87 92 104* 100*   Lipid Profile: No results for input(s): CHOL, HDL, LDLCALC, TRIG, CHOLHDL, LDLDIRECT in the last 72 hours. Thyroid  Function Tests: No results for input(s): TSH, T4TOTAL, FREET4, T3FREE, THYROIDAB in the last 72 hours. Anemia Panel: No results for input(s): VITAMINB12, FOLATE, FERRITIN, TIBC, IRON, RETICCTPCT in the last 72 hours. Sepsis Labs: Recent Labs  Lab  07/02/24 0327 07/03/24 0800  PROCALCITON 3.51 2.73    Recent Results (from the past 240 hours)  Blood Culture (routine x 2)     Status: None   Collection Time: 06/26/24 10:36 AM   Specimen: BLOOD  Result Value Ref Range Status   Specimen Description BLOOD SITE NOT SPECIFIED  Final   Special Requests   Final    BOTTLES DRAWN AEROBIC AND ANAEROBIC Blood Culture results may not be optimal due to an inadequate volume of blood received in culture bottles   Culture   Final    NO GROWTH 5 DAYS Performed at [facility], [address], [place], [place] [address]    Report Status 07/01/2024 FINAL  Final  Blood Culture (routine x 2)     Status: Abnormal   Collection Time: 06/26/24 10:41 AM   Specimen: BLOOD  Result Value Ref Range Status   Specimen Description BLOOD LEFT ANTECUBITAL  Final   Special Requests   Final    BOTTLES DRAWN AEROBIC AND ANAEROBIC Blood Culture results may not be optimal due to an inadequate volume of blood received in culture bottles   Culture  Setup Time   Final    GRAM POSITIVE COCCI IN BOTH AEROBIC AND ANAEROBIC BOTTLES CRITICAL RESULT CALLED TO, READ BACK BY AND VERIFIED WITH: PHARMD JENNY ZHOU [id number] AT 1405 BY EC    Culture (A)  Final    STAPHYLOCOCCUS EPIDERMIDIS THE SIGNIFICANCE OF ISOLATING THIS ORGANISM FROM A SINGLE SET OF BLOOD CULTURES WHEN MULTIPLE SETS ARE DRAWN IS UNCERTAIN. PLEASE NOTIFY THE MICROBIOLOGY DEPARTMENT WITHIN ONE WEEK IF SPECIATION AND SENSITIVITIES ARE REQUIRED. Performed at [facility], [address] [place], [place] [address]    Report Status 06/29/2024 FINAL  Final  Blood Culture ID Panel (Reflexed)     Status: Abnormal   Collection Time: 06/26/24 10:41 AM  Result Value Ref Range Status   Enterococcus faecalis NOT DETECTED NOT DETECTED Final   Enterococcus Faecium NOT DETECTED NOT DETECTED Final   Listeria monocytogenes NOT DETECTED NOT DETECTED Final   Staphylococcus species DETECTED (A) NOT DETECTED Final     Comment: CRITICAL RESULT CALLED TO, READ BACK BY AND VERIFIED WITH: PHARMD JENNY ZHOU [id number] AT 1405 BY EC    Staphylococcus aureus (BCID) NOT DETECTED NOT DETECTED Final   Staphylococcus epidermidis DETECTED (A) NOT DETECTED Final    Comment: Methicillin (oxacillin) resistant coagulase negative staphylococcus. Possible blood culture contaminant (unless isolated from more than one blood culture draw or clinical case suggests pathogenicity). No antibiotic  treatment is indicated for blood  culture contaminants. CRITICAL RESULT CALLED TO, READ BACK BY AND VERIFIED WITH: PHARMD JENNY ZHOU [id number] AT 1405 BY EC    Staphylococcus lugdunensis NOT DETECTED NOT DETECTED Final   Streptococcus species NOT DETECTED NOT DETECTED Final   Streptococcus agalactiae NOT DETECTED NOT DETECTED Final   Streptococcus pneumoniae NOT DETECTED NOT DETECTED Final   Streptococcus pyogenes NOT DETECTED NOT DETECTED Final   A.calcoaceticus-baumannii NOT DETECTED NOT DETECTED Final   Bacteroides fragilis NOT DETECTED NOT DETECTED Final   Enterobacterales NOT DETECTED NOT DETECTED Final   Enterobacter cloacae complex NOT DETECTED NOT DETECTED Final   Escherichia coli NOT DETECTED NOT DETECTED Final   Klebsiella aerogenes NOT DETECTED NOT DETECTED Final   Klebsiella oxytoca NOT DETECTED NOT DETECTED Final   Klebsiella pneumoniae NOT DETECTED NOT DETECTED Final   Proteus species NOT DETECTED NOT DETECTED Final   Salmonella species NOT DETECTED NOT DETECTED Final   Serratia marcescens NOT DETECTED NOT DETECTED Final   Haemophilus influenzae NOT DETECTED NOT DETECTED Final   Neisseria meningitidis NOT DETECTED NOT DETECTED Final   Pseudomonas aeruginosa NOT DETECTED NOT DETECTED Final   Stenotrophomonas maltophilia NOT DETECTED NOT DETECTED Final   Candida albicans NOT DETECTED NOT DETECTED Final   Candida auris NOT DETECTED NOT DETECTED Final   Candida glabrata NOT DETECTED NOT DETECTED Final   Candida krusei  NOT DETECTED NOT DETECTED Final   Candida parapsilosis NOT DETECTED NOT DETECTED Final   Candida tropicalis NOT DETECTED NOT DETECTED Final   Cryptococcus neoformans/gattii NOT DETECTED NOT DETECTED Final   Methicillin resistance mecA/C DETECTED (A) NOT DETECTED Final    Comment: CRITICAL RESULT CALLED TO, READ BACK BY AND VERIFIED WITH: MAYA RANDALL CALL [phone] AT 1405 BY EC Performed at [facility], [address] [place], [place] [address]   MRSA Next Gen by PCR, Nasal     Status: None   Collection Time: 06/26/24 12:19 PM   Specimen: Nasal Mucosa; Nasal Swab  Result Value Ref Range Status   MRSA by PCR Next Gen NOT DETECTED NOT DETECTED Final    Comment: (NOTE) The GeneXpert MRSA Assay (FDA approved for NASAL specimens only), is one component of a comprehensive MRSA colonization surveillance program. It is not intended to diagnose MRSA infection nor to guide or monitor treatment for MRSA infections. Test performance is not FDA approved in patients less than 39 years old. Performed at [facility], [address] [place], [place] [address]   Respiratory (~20 pathogens) panel by PCR     Status: None   Collection Time: 06/26/24 12:56 PM   Specimen: Nasopharyngeal Swab; Respiratory  Result Value Ref Range Status   Adenovirus NOT DETECTED NOT DETECTED Final   Coronavirus 229E NOT DETECTED NOT DETECTED Final    Comment: (NOTE) The Coronavirus on the Respiratory Panel, DOES NOT test for the novel  Coronavirus (2019 nCoV)    Coronavirus HKU1 NOT DETECTED NOT DETECTED Final   Coronavirus NL63 NOT DETECTED NOT DETECTED Final   Coronavirus OC43 NOT DETECTED NOT DETECTED Final   Metapneumovirus NOT DETECTED NOT DETECTED Final   Rhinovirus / Enterovirus NOT DETECTED NOT DETECTED Final   Influenza A NOT DETECTED NOT DETECTED Final   Influenza B NOT DETECTED NOT DETECTED Final   Parainfluenza Virus 1 NOT DETECTED NOT DETECTED Final   Parainfluenza Virus 2 NOT DETECTED  NOT DETECTED Final   Parainfluenza Virus 3 NOT DETECTED NOT DETECTED Final   Parainfluenza Virus 4  NOT DETECTED NOT DETECTED Final   Respiratory Syncytial Virus NOT DETECTED NOT DETECTED Final   Bordetella pertussis NOT DETECTED NOT DETECTED Final   Bordetella Parapertussis NOT DETECTED NOT DETECTED Final   Chlamydophila pneumoniae NOT DETECTED NOT DETECTED Final   Mycoplasma pneumoniae NOT DETECTED NOT DETECTED Final    Comment: Performed at [facility], [address] [place], [place] [address]  MRSA Next Gen by PCR, Nasal     Status: None   Collection Time: 07/02/24  3:43 AM   Specimen: Nasal Mucosa; Nasal Swab  Result Value Ref Range Status   MRSA by PCR Next Gen NOT DETECTED NOT DETECTED Final    Comment: (NOTE) The GeneXpert MRSA Assay (FDA approved for NASAL specimens only), is one component of a comprehensive MRSA colonization surveillance program. It is not intended to diagnose MRSA infection nor to guide or monitor treatment for MRSA infections. Test performance is not FDA approved in patients less than 41 years old. Performed at [facility], [address], [place], [place] [address]          Radiology Studies: DG Chest Port 1 View Result Date: 07/03/2024 CLINICAL DATA:  Tachypnea EXAM: PORTABLE CHEST 1 VIEW COMPARISON:  07/02/2024 FINDINGS: Stable cardiomediastinal silhouette. Increased layering right pleural effusion. Interstitial and airspace opacities throughout the right lung are similar. Left basilar atelectasis or infiltrates. No left pleural effusion. No pneumothorax. IMPRESSION: 1. Increased layering right pleural effusion. 2. Similar interstitial and airspace opacities throughout the right lung. 3. Left basilar atelectasis or infiltrates. Electronically Signed   By: Norman Gatlin M.D.   On: 07/03/2024 01:41   CT HEAD WO CONTRAST ( ) Result Date: 07/02/2024 CLINICAL DATA:  Initial evaluation for acute mental status change. EXAM: CT HEAD WITHOUT  CONTRAST TECHNIQUE: Contiguous axial images were obtained from the base of the skull through the vertex without intravenous contrast. RADIATION DOSE REDUCTION: This exam was performed according to the departmental dose-optimization program which includes automated exposure control, adjustment of the mA and/or kV according to patient size and/or use of iterative reconstruction technique. COMPARISON:  CT from 06/26/2024. FINDINGS: Brain: Generalized age-related cerebral atrophy. Patchy hypodensity involving the supratentorial cerebral white matter, consistent with chronic small vessel ischemic disease, moderately advanced in nature. No acute intracranial hemorrhage. No acute large vessel territory infarct. No mass lesion, midline shift or mass effect. No hydrocephalus or extra-axial fluid collection. Vascular: No abnormal hyperdense vessel. Scattered vascular calcifications noted within the carotid siphons. Skull: Scalp soft tissues demonstrate no acute finding. Erosive lesion at the left skull vertex again noted. Additional possible small lytic lesion measuring approximately 8 mm at the right frontal calvarium (series 7, image 9). Sinuses/Orbits: Streak artifact again noted emanating from the right globe. No acute finding. Mild moderate mucosal thickening about the left sphenoid sinus. Paranasal sinuses are otherwise clear. Small right mastoid effusion, stable from prior, suspected to be chronic. Other: Postoperative changes with multiple surgical clips partially visualized along the right temporalis muscle and within the right neck. IMPRESSION: 1. No acute intracranial abnormality. 2. 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 3. Erosive lesion at the left skull vertex, within additional small lytic lesion at the right frontal calvarium. Findings are nonspecific, but could reflect small osseous metastases. Correlation with dedicated bone scan could be performed for  further evaluation as warranted. Electronically Signed   By: Morene Hoard M.D.   On: 07/02/2024 03:10   DG Chest Port 1 View Result Date: 07/02/2024  CLINICAL DATA:  Altered mental status EXAM: PORTABLE CHEST 1 VIEW COMPARISON:  06/26/2024 FINDINGS: Shallow inspiration. Heart size is normal for technique. Airspace infiltrates in the right upper lung developing since prior study. This may indicate pneumonia. Previous basilar infiltrates are less prominent. No pleural effusion or pneumothorax. Mediastinal contours appear intact. Postoperative changes in the lower cervical spine and neck. IMPRESSION: Developing infiltration in the right upper lung suggesting pneumonia. Electronically Signed   By: Elsie Gravely M.D.   On: 07/02/2024 02:57        Scheduled Meds:  acidophilus  1 capsule Oral TID   Chlorhexidine  Gluconate Cloth  6 each Topical Daily   colchicine   0.6 mg Oral BID   diclofenac  Sodium  2 g Topical QID   feeding supplement  237 mL Oral BID BM   furosemide   40 mg Intravenous BID   gabapentin   400 mg Oral BID   Gerhardt's butt cream   Topical BID   heparin   5,000 Units Subcutaneous Q8H   insulin  aspart  0-9 Units Subcutaneous Q4H   nicotine   14 mg Transdermal Daily   nystatin   5 mL Oral TID AC & HS   sodium chloride  flush  10-40 mL Intracatheter Q12H   sodium chloride  flush  3 mL Intravenous Once   Continuous Infusions:  azithromycin  500 mg (07/03/24 1228)   cefTRIAXone  (ROCEPHIN )  IV     metronidazole  500 mg (07/03/24 0932)     LOS: 7 days   Time spent:  Elsie JAYSON Montclair, DO [facility]  If 7PM-7AM, please contact night-coverage [email or web address]  07/03/2024, 12:54 PM

## 2024-07-04 ENCOUNTER — Inpatient Hospital Stay (HOSPITAL_COMMUNITY)

## 2024-07-04 DIAGNOSIS — A419 Sepsis, unspecified organism: Secondary | ICD-10-CM | POA: Diagnosis not present

## 2024-07-04 DIAGNOSIS — R6521 Severe sepsis with septic shock: Secondary | ICD-10-CM | POA: Diagnosis not present

## 2024-07-04 LAB — GLUCOSE, CAPILLARY
Glucose-Capillary: 100 mg/dL — ABNORMAL HIGH (ref 70–99)
Glucose-Capillary: 101 mg/dL — ABNORMAL HIGH (ref 70–99)
Glucose-Capillary: 111 mg/dL — ABNORMAL HIGH (ref 70–99)
Glucose-Capillary: 94 mg/dL (ref 70–99)
Glucose-Capillary: 96 mg/dL (ref 70–99)

## 2024-07-04 MED ORDER — MORPHINE SULFATE (PF) 2 MG/ML IV SOLN: 1.0000 mg | INTRAVENOUS | Status: DC | PRN

## 2024-07-04 MED ORDER — LORAZEPAM 2 MG/ML IJ SOLN: 0.5000 mg | INTRAMUSCULAR | Status: DC | PRN

## 2024-07-04 MED ORDER — SCOPOLAMINE 1 MG/3DAYS TD PT72
1.0000 | MEDICATED_PATCH | TRANSDERMAL | Status: DC
  Administered 2024-07-04: 1.5 mg via TRANSDERMAL
  Filled 2024-07-04: qty 1

## 2024-07-04 NOTE — Progress Notes (Signed)
 SLP Cancellation Note  Patient Details Name: Phillip Logan MRN: [id number] DOB: Oct 14, 1946   Cancelled treatment:       Reason Eval/Treat Not Completed: Medical issues which prohibited therapy. Pt is obtunded and tachypneic (RR >40). Discussed with RN, who recommends SLP complete evaluation at a later time. Will continue following.    Damien Blumenthal, M.A., CCC-SLP Speech Language Pathology, Acute Rehabilitation Services  Secure Chat preferred [phone]  07/04/2024, 11:22 AM

## 2024-07-04 NOTE — Progress Notes (Addendum)
 PROGRESS NOTE    Phillip Logan  FMW:[id number] DOB: 1946/02/26 DOA: 06/26/2024 PCP: Jama Chow, MD   Brief Narrative:  78 years old male with past medical history of multiple comorbidities, admitted to the hospital for management of septic shock in the setting of multifocal pneumonia, transferred out of the ICU on 06/28/2024.  He is a resident of [facility].   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 - Concern for continued decline - will involve palliative/hospice in the next 24h pending clinical course. -Ok to continue oxygen/bipap PRN, antibiotics   Acute on chronic hypoxic respiratory failure, multifactorial Septic shock secondary to pneumonia, multifactorial High risk for aspiration, rule out pneumonitis Respiratory alkalosis Pleural effusion R>L - Acutely worse overnight after presumed aspiration event on the 11th - Imaging confirms effusion, restart IV furosemide , follow I's and O's - Wean oxygen as tolerated, currently on room air - Patient DNR but did discuss potential needing nonrebreather or BiPAP which wife confirmed would be reasonable.  RT following along, ABG unremarkable, initially concern for hypercarbia but given patient's tachypnea he CO2 levels remain essentially normal(mild respiratory alkalosis)  Septic shock, POA secondary to multifocal pneumonia - Shock resolved, off pressors, transition out of ICU 7/7 - Recurrent aspiration presumed, continue ceftriaxone  and Flagyl    Acute metabolic encephalopathy -  Patient appears more obtunded/lethargic.  Worsening O2 overnight, suspicion for worsening pneumonia, possible aspiration. Monitor function closely. 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  Patient at baseline.   Acute kidney injury - Likely initially ATN in the setting of septic shock.   - Currently within normal limits   Acute on chronic normocytic anemia - Status post 1 unit PRBC 7/7.   - No active bleeding, hemoglobin continues to improve   Recurrent/locally advanced squamous cell skin cancer - S/p chemo, outpatient follow-up with oncology and dermatology.   Physical debilitation muscle weakness - Previously at short-term rehab.  PT/OT on board recommending same.   Arthritic hand pain - Chronic appearing.  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  Voltaren  gel on board.   Skin tear lower extremity - Current during therapy session.  Cleaned, Steri-Strips applied, covered with bandage.  Change dressing daily.   DVT prophylaxis: heparin  injection 5,000 Units Start: 06/26/24 1400 Code Status:   Code Status: Limited: Do not attempt resuscitation (DNR) -DNR-LIMITED -Do Not Intubate/DNI  Family Communication: Wife updated over the phone  Status is: Inpatient  Dispo: The patient is from: Home              Anticipated d/c is to: Home  Anticipated d/c date is: To be determined              Patient  currently not medically stable for discharge  Consultants:  PCCM  Procedures:  None  Antimicrobials:  Azithromycin , ceftriaxone , metronidazole   Subjective: No acute issues or events overnight, patient remains poorly responsive, high risk for decompensation as per discussion above with family.  Review of systems unobtainable  Objective: Vitals:   07/04/24 0016 07/04/24 0358 07/04/24 0500 07/04/24 0741  BP: (!) 153/92 (!) 139/90  121/84  Pulse: (!) 115 (!) 116  (!) 115  Resp: 18 17  (!) 30  Temp: 99.3 F (37.4 C) 98.4 F (36.9 C)  97.7 F (36.5 C)  TempSrc: Axillary Axillary  Oral  SpO2: 98% 96%  96%  Weight:   70.3 kg   Height:        Intake/Output Summary (Last 24 hours) at 07/04/2024 0805 Last data filed at 07/04/2024 0725 Gross per 24 hour  Intake --  Output 800 ml  Net -800 ml   Filed Weights   07/02/24 0455 07/03/24 0500 07/04/24 0500  Weight: 73.8 kg 70.3 kg 70.3 kg    Examination:  General: Somnolent gaunt appearing gentleman, poorly arousable but protecting airway HEENT: Pupils equal round reactive to light, otherwise nonicteric, extraocular movements intact Neck:  Without mass or deformity.  Trachea is midline. Lungs: Diffusely rhonchorous with bibasilar rales Heart: Tachycardic, regular rhythm without overt murmurs rubs or gallops. Abdomen:  Soft, nontender, nondistended.  Without guarding or rebound. Extremities: Without notable edema.     Data Reviewed: I have personally reviewed following labs and imaging studies  CBC: Recent Labs  Lab 06/28/24 0410 06/28/24 2136 06/30/24 0744 07/01/24 0429 07/02/24 0327 07/03/24 0800  WBC 14.3*  --  15.5* 13.4* 15.9* 13.1*  NEUTROABS  --   --  13.3*  --  13.3*  --   HGB 6.5* 8.0* 10.6* 9.3* 11.4* 12.3*  HCT 21.2* 25.4* 33.2* 29.4* 35.5* 39.3  MCV 91.0  --  89.0 88.3 88.1 88.3  PLT 193  --  241 209 221 299   Basic Metabolic Panel: Recent Labs  Lab 06/27/24 2000 06/28/24 0410 06/30/24 0744  07/01/24 0429 07/02/24 0327 07/03/24 0800  NA  --  132* 136 133* 137 139  K  --  4.0 3.1* 2.9* 3.6 3.2*  CL  --  98 94* 93* 95* 96*  CO2  --  25 28 30 26  20*  GLUCOSE  --  95 68* 84 74 95  BUN  --  28* 16 14 10 16   CREATININE  --  1.19 0.99 0.93 0.96 1.10  CALCIUM  --  8.3* 8.5* 8.0* 8.6* 8.8*  MG 2.6* 2.4  --  1.1* 1.4* 1.9  PHOS  --  2.2*  --   --   --   --    GFR: Estimated Creatinine Clearance: 52.6 mL/min (by C-G formula based on SCr of 1.1 mg/dL). Liver Function Tests: Recent Labs  Lab 07/02/24 0327  AST 39  ALT 34  ALKPHOS 124  BILITOT 1.7*  PROT 6.5  ALBUMIN  2.4*   No results for input(s): LIPASE, AMYLASE in the last 168 hours. No results for input(s): AMMONIA in the last 168 hours. Coagulation Profile: No results for input(s): INR, PROTIME in the last 168 hours.  Cardiac Enzymes: No results for input(s): CKTOTAL, CKMB, CKMBINDEX, TROPONINI in the last 168 hours. BNP (last 3 results) No results for input(s): PROBNP in the last 8760 hours.  HbA1C: No results for input(s): HGBA1C in the last 72 hours. CBG: Recent Labs  Lab 07/03/24 1227 07/03/24 1705 07/03/24 1932 07/04/24 0019 07/04/24 0401  GLUCAP 100* 84 88 100* 111*   Lipid Profile: No results for input(s): CHOL, HDL, LDLCALC, TRIG, CHOLHDL, LDLDIRECT in the last 72 hours. Thyroid  Function Tests: No results for input(s): TSH, T4TOTAL, FREET4, T3FREE, THYROIDAB in the last 72 hours. Anemia Panel: No results for input(s): VITAMINB12, FOLATE, FERRITIN, TIBC, IRON, RETICCTPCT in the last 72 hours. Sepsis Labs: Recent Labs  Lab 07/02/24 0327 07/03/24 0800  PROCALCITON 3.51 2.73    Recent Results (from the past 240 hours)  Blood Culture (routine x 2)     Status: None   Collection Time: 06/26/24 10:36 AM   Specimen: BLOOD  Result Value Ref Range Status   Specimen Description BLOOD SITE NOT SPECIFIED  Final   Special Requests   Final     BOTTLES DRAWN AEROBIC AND ANAEROBIC Blood Culture results may not be optimal due to an inadequate volume of blood received in culture bottles   Culture   Final    NO GROWTH 5 DAYS Performed at [facility], [address] [place], [place] [address]    Report Status 07/01/2024 FINAL  Final  Blood Culture (routine x 2)     Status: Abnormal   Collection Time: 06/26/24 10:41 AM   Specimen: BLOOD  Result Value Ref Range Status   Specimen Description BLOOD LEFT ANTECUBITAL  Final   Special Requests   Final    BOTTLES DRAWN AEROBIC AND ANAEROBIC Blood Culture results may not be optimal due to an inadequate volume of blood received in culture bottles   Culture  Setup Time   Final    GRAM POSITIVE COCCI IN BOTH AEROBIC AND ANAEROBIC BOTTLES CRITICAL RESULT CALLED TO, READ BACK BY AND VERIFIED WITH: PHARMD JENNY ZHOU [id number] AT 1405 BY EC    Culture (A)  Final    STAPHYLOCOCCUS EPIDERMIDIS THE SIGNIFICANCE OF ISOLATING THIS ORGANISM FROM A SINGLE SET OF BLOOD CULTURES WHEN MULTIPLE SETS ARE DRAWN IS UNCERTAIN. PLEASE NOTIFY THE MICROBIOLOGY DEPARTMENT WITHIN ONE WEEK IF SPECIATION AND SENSITIVITIES ARE REQUIRED. Performed at [facility], [address], [place], [place] [address]    Report Status 06/29/2024 FINAL  Final  Blood Culture ID Panel (Reflexed)     Status: Abnormal   Collection Time: 06/26/24 10:41 AM  Result Value Ref Range Status   Enterococcus faecalis NOT DETECTED NOT DETECTED Final   Enterococcus Faecium NOT DETECTED NOT DETECTED Final   Listeria monocytogenes NOT DETECTED NOT DETECTED Final   Staphylococcus species DETECTED (A) NOT DETECTED Final    Comment: CRITICAL RESULT CALLED TO, READ BACK BY AND VERIFIED WITH: PHARMD JENNY ZHOU [phone] AT 1405 BY EC    Staphylococcus aureus (BCID) NOT DETECTED NOT DETECTED Final   Staphylococcus epidermidis DETECTED (A) NOT DETECTED Final    Comment: Methicillin (oxacillin) resistant coagulase negative staphylococcus.  Possible blood culture contaminant (unless isolated from more than one blood culture draw or clinical case suggests pathogenicity). No antibiotic treatment is indicated for blood  culture contaminants. CRITICAL RESULT CALLED TO, READ BACK BY AND VERIFIED WITH: PHARMD JENNY ZHOU [phone] AT 1405 BY EC    Staphylococcus lugdunensis NOT DETECTED NOT DETECTED Final   Streptococcus species NOT DETECTED NOT DETECTED Final   Streptococcus agalactiae NOT DETECTED NOT DETECTED Final   Streptococcus pneumoniae NOT DETECTED NOT DETECTED Final   Streptococcus pyogenes NOT DETECTED NOT  DETECTED Final   A.calcoaceticus-baumannii NOT DETECTED NOT DETECTED Final   Bacteroides fragilis NOT DETECTED NOT DETECTED Final   Enterobacterales NOT DETECTED NOT DETECTED Final   Enterobacter cloacae complex NOT DETECTED NOT DETECTED Final   Escherichia coli NOT DETECTED NOT DETECTED Final   Klebsiella aerogenes NOT DETECTED NOT DETECTED Final   Klebsiella oxytoca NOT DETECTED NOT DETECTED Final   Klebsiella pneumoniae NOT DETECTED NOT DETECTED Final   Proteus species NOT DETECTED NOT DETECTED Final   Salmonella species NOT DETECTED NOT DETECTED Final   Serratia marcescens NOT DETECTED NOT DETECTED Final   Haemophilus influenzae NOT DETECTED NOT DETECTED Final   Neisseria meningitidis NOT DETECTED NOT DETECTED Final   Pseudomonas aeruginosa NOT DETECTED NOT DETECTED Final   Stenotrophomonas maltophilia NOT DETECTED NOT DETECTED Final   Candida albicans NOT DETECTED NOT DETECTED Final   Candida auris NOT DETECTED NOT DETECTED Final   Candida glabrata NOT DETECTED NOT DETECTED Final   Candida krusei NOT DETECTED NOT DETECTED Final   Candida parapsilosis NOT DETECTED NOT DETECTED Final   Candida tropicalis NOT DETECTED NOT DETECTED Final   Cryptococcus neoformans/gattii NOT DETECTED NOT DETECTED Final   Methicillin resistance mecA/C DETECTED (A) NOT DETECTED Final    Comment: CRITICAL RESULT CALLED TO, READ BACK  BY AND VERIFIED WITH: MAYA RANDALL CALL [phone] AT 1405 BY EC Performed at [facility], [address], [place], [place] [address]   MRSA Next Gen by PCR, Nasal     Status: None   Collection Time: 06/26/24 12:19 PM   Specimen: Nasal Mucosa; Nasal Swab  Result Value Ref Range Status   MRSA by PCR Next Gen NOT DETECTED NOT DETECTED Final    Comment: (NOTE) The GeneXpert MRSA Assay (FDA approved for NASAL specimens only), is one component of a comprehensive MRSA colonization surveillance program. It is not intended to diagnose MRSA infection nor to guide or monitor treatment for MRSA infections. Test performance is not FDA approved in patients less than 76 years old. Performed at [facility], [address] [place], [place] [address]   Respiratory (~20 pathogens) panel by PCR     Status: None   Collection Time: 06/26/24 12:56 PM   Specimen: Nasopharyngeal Swab; Respiratory  Result Value Ref Range Status   Adenovirus NOT DETECTED NOT DETECTED Final   Coronavirus 229E NOT DETECTED NOT DETECTED Final    Comment: (NOTE) The Coronavirus on the Respiratory Panel, DOES NOT test for the novel  Coronavirus (2019 nCoV)    Coronavirus HKU1 NOT DETECTED NOT DETECTED Final   Coronavirus NL63 NOT DETECTED NOT DETECTED Final   Coronavirus OC43 NOT DETECTED NOT DETECTED Final   Metapneumovirus NOT DETECTED NOT DETECTED Final   Rhinovirus / Enterovirus NOT DETECTED NOT DETECTED Final   Influenza A NOT DETECTED NOT DETECTED Final   Influenza B NOT DETECTED NOT DETECTED Final   Parainfluenza Virus 1 NOT DETECTED NOT DETECTED Final   Parainfluenza Virus 2 NOT DETECTED NOT DETECTED Final   Parainfluenza Virus 3 NOT DETECTED NOT DETECTED Final   Parainfluenza Virus 4 NOT DETECTED NOT DETECTED Final   Respiratory Syncytial Virus NOT DETECTED NOT DETECTED Final   Bordetella pertussis NOT DETECTED NOT DETECTED Final   Bordetella Parapertussis NOT DETECTED NOT DETECTED Final    Chlamydophila pneumoniae NOT DETECTED NOT DETECTED Final   Mycoplasma pneumoniae NOT DETECTED NOT DETECTED Final    Comment: Performed at [facility], [address], [place], [place] [address]  MRSA  Next Gen by PCR, Nasal     Status: None   Collection Time: 07/02/24  3:43 AM   Specimen: Nasal Mucosa; Nasal Swab  Result Value Ref Range Status   MRSA by PCR Next Gen NOT DETECTED NOT DETECTED Final    Comment: (NOTE) The GeneXpert MRSA Assay (FDA approved for NASAL specimens only), is one component of a comprehensive MRSA colonization surveillance program. It is not intended to diagnose MRSA infection nor to guide or monitor treatment for MRSA infections. Test performance is not FDA approved in patients less than 45 years old. Performed at [facility], [address], [place], [place] [address]          Radiology Studies: CT HEAD WO CONTRAST ( ) Result Date: 07/04/2024 CLINICAL DATA:  Mental status change, persistent or worsening EXAM: CT HEAD WITHOUT CONTRAST TECHNIQUE: Contiguous axial images were obtained from the base of the skull through the vertex without intravenous contrast. RADIATION DOSE REDUCTION: This exam was performed according to the departmental dose-optimization program which includes automated exposure control, adjustment of the mA and/or kV according to patient size and/or use of iterative reconstruction technique. COMPARISON:  CT head 07/02/2024. FINDINGS: Brain: No evidence of acute infarction, hemorrhage, hydrocephalus, extra-axial collection or mass lesion/mass effect. Patchy white matter hypodensities are nonspecific but compatible with chronic microvascular ischemic disease. Cerebral atrophy. Vascular: No hyperdense vessel identified. Calcific atherosclerosis. Skull: Redemonstrated erosive lesion at the skull vertex an additional possible small lytic lesion in the right frontal calvarium. Sinuses/Orbits: No acute orbital abnormality. Right lid weight.  Mild left sphenoid sinus mucosal thickening. Other: No mastoid effusions. IMPRESSION: 1. No evidence of acute intracranial abnormality. 2. 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 3. Similar calvarial lesions, as described on recent CT head. Electronically Signed   By: Gilmore GORMAN Molt M.D.   On: 07/04/2024 03:30   DG Chest Port 1 View Result Date: 07/03/2024 CLINICAL DATA:  Tachypnea EXAM: PORTABLE CHEST 1 VIEW COMPARISON:  07/02/2024 FINDINGS: Stable cardiomediastinal silhouette. Increased layering right pleural effusion. Interstitial and airspace opacities throughout the right lung are similar. Left basilar atelectasis or infiltrates. No left pleural effusion. No pneumothorax. IMPRESSION: 1. Increased layering right pleural effusion. 2. Similar interstitial and airspace opacities throughout the right lung. 3. Left basilar atelectasis or infiltrates. Electronically Signed   By: Norman Gatlin M.D.   On: 07/03/2024 01:41        Scheduled Meds:  acidophilus  1 capsule Oral TID   Chlorhexidine  Gluconate Cloth  6 each Topical Daily   colchicine   0.6 mg Oral BID   diclofenac  Sodium  2 g Topical QID   feeding supplement  237 mL Oral BID BM   furosemide   40 mg Intravenous BID   gabapentin   400 mg Oral BID   Gerhardt's butt cream   Topical BID   heparin   5,000 Units Subcutaneous Q8H   insulin  aspart  0-9 Units Subcutaneous Q4H   nicotine   14 mg Transdermal Daily   nystatin   5 mL Oral TID AC & HS   sodium chloride  flush  10-40 mL Intracatheter Q12H   sodium chloride  flush  3 mL Intravenous Once   Continuous Infusions:  azithromycin  500 mg (07/03/24 1228)   cefTRIAXone  (ROCEPHIN )  IV 2 g (07/03/24 1512)   metronidazole  500 mg (07/03/24 2212)     LOS: 8 days   Time spent:  Elsie JAYSON Montclair, DO [facility]  If 7PM-7AM, please contact night-coverage [email or web address]  07/04/2024, 8:05 AM

## 2024-07-04 NOTE — Progress Notes (Signed)
 Wound dressing at the buttocks changed this morning, no new issue noted.

## 2024-07-04 NOTE — Plan of Care (Signed)
  Problem: Education: Goal: Ability to describe self-care measures that may prevent or decrease complications (Diabetes Survival Skills Education) will improve Outcome: Not Progressing Goal: Individualized Educational Video(s) Outcome: Not Progressing   Problem: Coping: Goal: Ability to adjust to condition or change in health will improve Outcome: Not Progressing  Patient is non responsive

## 2024-07-05 DIAGNOSIS — R6521 Severe sepsis with septic shock: Secondary | ICD-10-CM | POA: Diagnosis not present

## 2024-07-05 DIAGNOSIS — A419 Sepsis, unspecified organism: Secondary | ICD-10-CM | POA: Diagnosis not present

## 2024-07-05 LAB — CBC
HCT: 40.1 % (ref 39.0–52.0)
Hemoglobin: 11.9 g/dL — ABNORMAL LOW (ref 13.0–17.0)
MCH: 27.9 pg (ref 26.0–34.0)
MCHC: 29.7 g/dL — ABNORMAL LOW (ref 30.0–36.0)
MCV: 93.9 fL (ref 80.0–100.0)
Platelets: 330 K/uL (ref 150–400)
RBC: 4.27 MIL/uL (ref 4.22–5.81)
RDW: 16.5 % — ABNORMAL HIGH (ref 11.5–15.5)
WBC: 14.3 K/uL — ABNORMAL HIGH (ref 4.0–10.5)
nRBC: 0 % (ref 0.0–0.2)

## 2024-07-05 LAB — GLUCOSE, CAPILLARY
Glucose-Capillary: 106 mg/dL — ABNORMAL HIGH (ref 70–99)
Glucose-Capillary: 92 mg/dL (ref 70–99)
Glucose-Capillary: 74 mg/dL (ref 70–99)
Glucose-Capillary: 84 mg/dL (ref 70–99)
Glucose-Capillary: 80 mg/dL (ref 70–99)
Glucose-Capillary: 83 mg/dL (ref 70–99)

## 2024-07-05 LAB — BASIC METABOLIC PANEL WITH GFR
Anion gap: 19 — ABNORMAL HIGH (ref 5–15)
BUN: 35 mg/dL — ABNORMAL HIGH (ref 8–23)
CO2: 21 mmol/L — ABNORMAL LOW (ref 22–32)
Calcium: 8.3 mg/dL — ABNORMAL LOW (ref 8.9–10.3)
Chloride: 108 mmol/L (ref 98–111)
Creatinine, Ser: 2.67 mg/dL — ABNORMAL HIGH (ref 0.61–1.24)
GFR, Estimated: 24 mL/min — ABNORMAL LOW (ref >60)
Glucose, Bld: 85 mg/dL (ref 70–99)
Potassium: 3.9 mmol/L (ref 3.5–5.1)
Sodium: 148 mmol/L — ABNORMAL HIGH (ref 135–145)

## 2024-07-05 MED ORDER — ACETAMINOPHEN 10 MG/ML IV SOLN
1000.0000 mg | Freq: Four times a day (QID) | INTRAVENOUS | Status: DC | PRN
  Administered 2024-07-05: 1000 mg via INTRAVENOUS
  Filled 2024-07-05 (×2): qty 100

## 2024-07-05 MED ORDER — SODIUM CHLORIDE 0.9 % IV BOLUS: 500.0000 mL | Freq: Once | INTRAVENOUS | Status: DC

## 2024-07-23 NOTE — Death Summary Note (Signed)
 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 - Profound medical change in patient on 2024/07/27 with worsening respiratory status/hypotension -  Patient remains DNR with no aggressive care as discussed with family - palliative care consulted earlier this afternoon in hopes to setup family meeting tomorrow to discuss palliative/home hospice if patient did not improve. Given family wishes during rapid response patient was supported with increased oxygen and low volume bolus in attempts to improve blood pressure. Family unable to be contacted despite numerous attempts by the care team (rapid, the nurse, as well as myself all left messages). Patient's family called back and was notified of his worsening status - en route to the hospital patient's blood pressure continued to decline as did his oxygen and patient ultimately passed this afternoon at 18:06 (on 07-27-24).   Septic shock, POA secondary to multifocal pneumonia - Shock initially resolved,  off pressors and transitioned out of ICU 7/7 - Recurrent aspiration presumed, continue ceftriaxone  and Flagyl  - Worsening hypotension in the setting of recurrent septic shock due to recurrent episodes of aspiration - no escalation of care - discussed pressors with family, not appropriate given our goals of comfort/avoid aggressive care.   Acute metabolic encephalopathy - Patient appears more obtunded/lethargic.  Worsening O2 overnight, suspicion for worsening pneumonia, possible aspiration. Monitor function closely. 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   Acute kidney injury - Likely initially ATN in the setting of septic shock.   - Continues to worsen given above   Acute on chronic normocytic anemia - Status post 1 unit PRBC 7/7.   - No active bleeding   Recurrent/locally advanced squamous cell skin cancer - S/p chemo, outpatient follow-up with oncology and dermatology.   Physical debilitation muscle weakness - Previously at short-term rehab   Arthritic hand pain - Chronic appearing.  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  Voltaren  gel on board.   Skin tear lower extremity - Current during therapy session.  Cleaned, Steri-Strips applied, covered with bandage.  Hospital Course: No notes on file  Assessment and Plan: No notes have been filed under this hospital service. Service: Hospitalist    The results of significant diagnostics from this  hospitalization (including imaging, microbiology, ancillary and  laboratory) are listed below for reference.   Significant Diagnostic Studies: CT HEAD WO CONTRAST ( ) Result Date: 07/04/2024 CLINICAL DATA:  Mental status change, persistent or worsening EXAM: CT HEAD WITHOUT CONTRAST TECHNIQUE: Contiguous axial images were obtained from the base of the skull through the vertex without intravenous contrast. RADIATION DOSE REDUCTION: This exam was performed according to the departmental dose-optimization program which includes automated exposure control, adjustment of the mA and/or kV according to patient size and/or use of iterative reconstruction technique. COMPARISON:  CT head 07/02/2024. FINDINGS: Brain: No evidence of acute infarction, hemorrhage, hydrocephalus, extra-axial collection or mass lesion/mass effect. Patchy white matter hypodensities are nonspecific but compatible with chronic microvascular ischemic disease. Cerebral atrophy. Vascular: No hyperdense vessel identified. Calcific atherosclerosis. Skull: Redemonstrated erosive lesion at the skull vertex an additional possible small lytic lesion in the right frontal calvarium. Sinuses/Orbits: No acute orbital abnormality. Right lid weight. Mild left sphenoid sinus mucosal thickening. Other: No mastoid effusions. IMPRESSION: 1. No evidence of acute intracranial abnormality. 2. 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 3. Similar calvarial lesions, as described on recent CT head. Electronically Signed   By: Gilmore GORMAN Molt M.D.   On: 07/04/2024 03:30   DG Chest Port 1 View Result Date: 07/03/2024 CLINICAL DATA:  Tachypnea EXAM: PORTABLE CHEST 1 VIEW COMPARISON:  07/02/2024 FINDINGS: Stable cardiomediastinal silhouette. Increased layering right pleural effusion. Interstitial and airspace opacities throughout the right lung are similar. Left basilar atelectasis or infiltrates. No left pleural effusion. No pneumothorax. IMPRESSION: 1. Increased layering right pleural  effusion. 2. Similar interstitial and airspace opacities throughout the right lung. 3. Left basilar atelectasis or infiltrates. Electronically Signed   By: Norman Gatlin M.D.   On: 07/03/2024 01:41   CT HEAD WO CONTRAST ( ) Result Date: 07/02/2024 CLINICAL DATA:  Initial evaluation for acute mental status change. EXAM: CT HEAD WITHOUT CONTRAST TECHNIQUE: Contiguous axial images were obtained from the base of the skull through the vertex without intravenous contrast. RADIATION DOSE REDUCTION: This exam was performed according to the departmental dose-optimization program which includes automated exposure control, adjustment of the mA and/or kV according to patient size and/or use of iterative reconstruction technique. COMPARISON:  CT from 06/26/2024. FINDINGS: Brain: Generalized age-related cerebral atrophy. Patchy hypodensity involving the supratentorial cerebral white matter, consistent with chronic small vessel ischemic disease, moderately advanced in nature. No acute intracranial hemorrhage. No acute large vessel territory infarct. No mass lesion, midline shift or mass effect. No hydrocephalus or extra-axial fluid collection. Vascular: No abnormal hyperdense vessel. Scattered vascular calcifications noted within the carotid siphons. Skull: Scalp soft tissues demonstrate no acute finding. Erosive lesion at the left skull vertex again noted. Additional possible small lytic lesion measuring approximately 8 mm at the right frontal calvarium (series 7, image 9). Sinuses/Orbits: Streak artifact again noted emanating from the right globe. No acute finding. Mild moderate mucosal thickening about the left sphenoid sinus. Paranasal sinuses are otherwise clear. Small right mastoid effusion, stable from prior, suspected to be chronic. Other: Postoperative changes with multiple surgical clips partially visualized along the right temporalis muscle and within the right neck. IMPRESSION: 1. No acute intracranial  abnormality. 2. 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 3. Erosive lesion at the left skull vertex, within additional small lytic lesion at the right frontal calvarium. Findings are nonspecific, but could reflect small osseous metastases. Correlation with dedicated bone scan could be performed for further evaluation as warranted. Electronically  Signed   By: Morene Hoard M.D.   On: 07/02/2024 03:10   DG Chest Port 1 View Result Date: 07/02/2024 CLINICAL DATA:  Altered mental status EXAM: PORTABLE CHEST 1 VIEW COMPARISON:  06/26/2024 FINDINGS: Shallow inspiration. Heart size is normal for technique. Airspace infiltrates in the right upper lung developing since prior study. This may indicate pneumonia. Previous basilar infiltrates are less prominent. No pleural effusion or pneumothorax. Mediastinal contours appear intact. Postoperative changes in the lower cervical spine and neck. IMPRESSION: Developing infiltration in the right upper lung suggesting pneumonia. Electronically Signed   By: Elsie Gravely M.D.   On: 07/02/2024 02:57   ECHOCARDIOGRAM COMPLETE Result Date: 06/26/2024    ECHOCARDIOGRAM REPORT   Patient Name:   Melford CORNELIOUS Sather Date of Exam: 06/26/2024 Medical Rec #:  [id number]                Height:       67.0 in Accession #:    [id number]               Weight:       177.2 lb Date of Birth:  [birth year]                 BSA:          1.921 m Patient Age:    77 years                 BP:           75/47 mmHg Patient Gender: M                        HR:           115 bpm. Exam Location:  Inpatient Procedure: 2D Echo, Cardiac Doppler and Color Doppler (Both Spectral and Color            Flow Doppler were utilized during procedure). Indications:    Shock  History:        Patient has no prior history of Echocardiogram examinations.  Sonographer:    Therisa Crouch Referring Phys: [phone] MATTHEW R HUNSUCKER IMPRESSIONS  1. Left ventricular  ejection fraction, by estimation, is 55 to 60%. The left ventricle has normal function. The left ventricle has no regional wall motion abnormalities. Left ventricular diastolic parameters were normal.  2. Right ventricular systolic function is mildly reduced. The right ventricular size is normal. Tricuspid regurgitation signal is inadequate for assessing PA pressure.  3. Right atrial size was mildly dilated.  4. The mitral valve is degenerative. Mild mitral valve regurgitation.  5. The aortic valve was not well visualized, probably trilealet. There is moderate calcification of the aortic valve. Aortic valve regurgitation is trivial. Moderate aortic valve stenosis. Aortic valve area, by VTI measures 1.28 cm. Aortic valve mean gradient measures 29.0 mmHg. Dimentionless index 0.41.  6. The inferior vena cava is normal in size with greater than 50% respiratory variability, suggesting right atrial pressure of 3 mmHg. Comparison(s): No prior Echocardiogram. FINDINGS  Left Ventricle: Left ventricular ejection fraction, by estimation, is 55 to 60%. The left ventricle has normal function. The left ventricle has no regional wall motion abnormalities. The left ventricular internal cavity size was normal in size. There is  borderline left ventricular hypertrophy. Left ventricular diastolic parameters were normal. Right Ventricle: The right ventricular size is normal. No increase in right ventricular wall thickness. Right ventricular systolic function is mildly reduced. Tricuspid regurgitation signal is inadequate for assessing  PA pressure. Left Atrium: Left atrial size was normal in size. Right Atrium: Right atrial size was mildly dilated. Pericardium: There is no evidence of pericardial effusion. Presence of epicardial fat layer. Mitral Valve: The mitral valve is degenerative in appearance. There is mild calcification of the mitral valve leaflet(s). Mild mitral annular calcification. Mild mitral valve regurgitation. Tricuspid  Valve: The tricuspid valve is grossly normal. Tricuspid valve regurgitation is mild. Aortic Valve: The aortic valve was not well visualized. There is moderate calcification of the aortic valve. There is moderate aortic valve annular calcification. Aortic valve regurgitation is trivial. Moderate aortic stenosis is present. Aortic valve mean gradient measures 29.0 mmHg. Aortic valve peak gradient measures 53.7 mmHg. Aortic valve area, by VTI measures 1.28 cm. Pulmonic Valve: The pulmonic valve was not well visualized. Pulmonic valve regurgitation is trivial. Aorta: The aortic root and ascending aorta are structurally normal, with no evidence of dilitation. Venous: The inferior vena cava is normal in size with greater than 50% respiratory variability, suggesting right atrial pressure of 3 mmHg. IAS/Shunts: No atrial level shunt detected by color flow Doppler. Additional Comments: 3D was performed not requiring image post processing on an independent workstation and was indeterminate.  LEFT VENTRICLE PLAX 2D LVIDd:         5.10 cm      Diastology LVIDs:         3.90 cm      LV e' medial:    12.90 cm/s LV PW:         1.00 cm      LV E/e' medial:  6.3 LV IVS:        0.90 cm      LV e' lateral:   18.20 cm/s LVOT diam:     2.00 cm      LV E/e' lateral: 4.5 LV SV:         68 LV SV Index:   35 LVOT Area:     3.14 cm  LV Volumes (MOD) LV vol d, MOD A2C: 119.0 ml LV vol d, MOD A4C: 71.1 ml LV vol s, MOD A2C: 45.6 ml LV vol s, MOD A4C: 24.3 ml LV SV MOD A2C:     73.4 ml LV SV MOD A4C:     71.1 ml LV SV MOD BP:      63.9 ml RIGHT VENTRICLE             IVC RV Basal diam:  4.30 cm     IVC diam: 1.30 cm RV S prime:     16.30 cm/s TAPSE (M-mode): 2.2 cm LEFT ATRIUM           Index        RIGHT ATRIUM           Index LA diam:      3.20 cm 1.67 cm/m   RA Area:     21.90 cm LA Vol (A4C): 46.9 ml 24.41 ml/m  RA Volume:   66.20 ml  34.46 ml/m  AORTIC VALVE AV Area (Vmax):    1.18 cm AV Area (Vmean):   1.25 cm AV Area (VTI):      1.28 cm AV Vmax:           366.50 cm/s AV Vmean:          246.250 cm/s AV VTI:            0.526 m AV Peak Grad:      53.7 mmHg AV Mean Grad:  29.0 mmHg LVOT Vmax:         138.00 cm/s LVOT Vmean:        97.800 cm/s LVOT VTI:          0.215 m LVOT/AV VTI ratio: 0.41  AORTA Ao Root diam: 2.80 cm Ao Asc diam:  3.20 cm MITRAL VALVE MV Area (PHT): 3.61 cm    SHUNTS MV Decel Time: 210 msec    Systemic VTI:  0.22 m MV E velocity: 81.20 cm/s  Systemic Diam: 2.00 cm MV A velocity: 92.80 cm/s MV E/A ratio:  0.88 Jayson Sierras MD Electronically signed by Jayson Sierras MD Signature Date/Time: 06/26/2024/3:58:18 PM    Final    DG Chest Port 1 View Result Date: 06/26/2024 CLINICAL DATA:  Questionable sepsis - evaluate for abnormality EXAM: PORTABLE CHEST 1 VIEW COMPARISON:  Radiograph 06/12/2024 and 05/31/2024.  CT 05/25/2024. FINDINGS: 1111 hours. The heart size and mediastinal contours are stable. There are progressive basilar predominant airspace opacities in both lungs with poor definition of the pulmonary vasculature. No pneumothorax or significant pleural effusion. The bones appear unchanged status post multilevel cervical fusion. Telemetry leads overlie the chest. IMPRESSION: Progressive basilar predominant airspace opacities in both lungs, suspicious for multifocal pneumonia. Electronically Signed   By: Elsie Perone M.D.   On: 06/26/2024 12:10   CT ANGIO HEAD NECK W WO CM W PERF (CODE STROKE) Result Date: 06/26/2024 CLINICAL DATA:  78 year old male code stroke presentation. Abnormal speech, right facial droop. EXAM: CT ANGIOGRAPHY HEAD AND NECK CT PERFUSION BRAIN TECHNIQUE: Multidetector CT imaging of the head and neck was performed using the standard protocol during bolus administration of intravenous contrast. Multiplanar CT image reconstructions and MIPs were obtained to evaluate the vascular anatomy. Carotid stenosis measurements (when applicable) are obtained utilizing NASCET criteria, using the distal  internal carotid diameter as the denominator. Multiphase CT imaging of the brain was performed following IV bolus contrast injection. Subsequent parametric perfusion maps were calculated using RAPID software. RADIATION DOSE REDUCTION: This exam was performed according to the departmental dose-optimization program which includes automated exposure control, adjustment of the mA and/or kV according to patient size and/or use of iterative reconstruction technique. CONTRAST:  Not specified. COMPARISON:  Head CT 1043 hours today. FINDINGS: CT Brain Perfusion Findings: ASPECTS: 10 CBF (<30%) Volume: 0mL Perfusion (Tmax>6.0s) volume: 0mL Mismatch Volume: Not applicable Infarction Location:Not applicable CTA NECK Skeleton: Cervical ACDF C4 through C7 with evidence of solid arthrodesis. Developing degenerative interbody ankylosis at C3-C4. Carious dentition. Redemonstrated left vertex skull outer table erosion series 5, image 6. No other acute or suspicious osseous lesion identified. Upper chest: Multifocal upper lung peribronchial spiculated and ground-glass opacity greater on the right (series 5, image 163) up to 3 cm diameter, nonspecific. Small layering left pleural effusion. No superior mediastinal lymphadenopathy identified. Other neck: Previous right neck dissection. Surgically absent submandibular and parotid glands. Numerous right neck surgical clips. Right IJ remains in place. Right sternocleidomastoid muscle appears small but in place. No superimposed neck mass or lymphadenopathy is identified. Aortic arch: Calcified aortic atherosclerosis.  Three vessel arch. Right carotid system: Brachiocephalic artery plaque with up to 50 % stenosis with respect to the distal vessel. Right CCA origin relatively spared. Soft and calcified plaque through the right carotid bifurcation without significant stenosis. Bulky calcified plaque right ICA bulb with less than 50 % stenosis with respect to the distal vessel. Left carotid  system: Motion artifact. Left CCA origin plaque within unclear degree of subsequent stenosis. Left CCA remains patent.  Anterior vessel soft and calcified plaque before the bifurcation without stenosis. Calcified plaque at the left ICA origin and bulb without stenosis. Tortuosity below the skull base. Vertebral arteries: Proximal right subclavian artery and right vertebral artery origin patent with only mild plaque and no significant stenosis. Non dominant appearing right vertebral artery remains patent to the skull base. Bulky left subclavian origin plaque although detail degraded by motion. Hemodynamically significant stenosis suspected on series 10, image 42. Left subclavian and left vertebral artery origin remain patent. Mild left vertebral origin calcified plaque without stenosis. Dominant left vertebral artery patent to the skull base without stenosis. CTA HEAD Posterior circulation: Dominant left vertebral artery with mild calcified the 4 segment plaque and no stenosis to the vertebrobasilar junction. Diminutive right V4 segment but remains patent to the basilar. Bilateral AICA appear dominant and patent. Patent basilar artery without stenosis. Tortuous basilar. Patent SCA and PCA origins. Posterior communicating arteries are diminutive or absent. Moderate to severe left and moderate right P1 segment irregularity and stenosis on series 7, image 18. Additional moderate to severe left P2 segment stenosis on series 9, image 19. Moderate additional multifocal right P2 segment stenosis on series 9, image 14. Maintained bilateral distal PCA enhancement. Anterior circulation: Both ICA siphons are patent. Calcified left cavernous and supraclinoid segment plaque with only mild stenosis. Similar calcified right siphon plaque with only mild stenosis. Patent carotid termini. Patent MCA and ACA origins. Normal anterior communicating artery. Bilateral ACA branches are within normal limits. Left MCA M1 segment and  trifurcation are patent without stenosis. Right MCA M1 segment and trifurcation are patent without stenosis. Bilateral MCA branches are within normal limits. Venous sinuses: Patent. Anatomic variants: Dominant left vertebral artery. Review of the MIP images confirms the above findings IMPRESSION: 1. Negative CT Perfusion. 2. Abundant atherosclerosis at the aortic arch, throughout the head and neck. But negative for large vessel occlusion. Aortic Atherosclerosis (ICD10-I70.0). Notable stenoses: -  Moderate to Severe bilateral PCA P1 and P2. - motion artifact but probably hemodynamically significant Left Subclavian Origin stenosis. And similar plaque but unclear degree of stenosis at the Left CCA origin. 3. Nonspecific abnormal right greater than left upper lobe lung peribronchial opacity, mildly spiculated. Small layering left pleural effusion. 4. Previous Right neck dissection. New or increased left vertex outer table skull erosion from Head CT last month. But no other mass or metastatic disease identified in the Neck. Electronically Signed   By: VEAR Hurst M.D.   On: 06/26/2024 11:32   CT HEAD CODE STROKE WO CONTRAST Result Date: 06/26/2024 CLINICAL DATA:  Code stroke.  78 year old male. EXAM: CT HEAD WITHOUT CONTRAST TECHNIQUE: Contiguous axial images were obtained from the base of the skull through the vertex without intravenous contrast. RADIATION DOSE REDUCTION: This exam was performed according to the departmental dose-optimization program which includes automated exposure control, adjustment of the mA and/or kV according to patient size and/or use of iterative reconstruction technique. COMPARISON:  Head CT 05/31/2024. FINDINGS: Brain: No midline shift, ventriculomegaly, mass effect, evidence of mass lesion, intracranial hemorrhage or evidence of cortically based acute infarction. Streak artifact from the right globe. Patchy and confluent bilateral cerebral white matter hypodensity with some deep white matter  capsule involvement which is fairly symmetric on both sides. No cytotoxic edema is identified. Vascular: Calcified atherosclerosis at the skull base. No suspicious intracranial vascular hyperdensity. Skull: There is a new or increased vertex skull erosion on the left, series 4, image 87 and encompassing an area of about 2 cm.  Other visible bone mineralization within normal limits. No other acute osseous abnormality identified. Sinuses/Orbits: Right ethmoid and left sphenoid sinus mucosal thickening not significantly changed. Other: Chronic metal artifact along the anterior right globe, multiple surgical clips in the visible right face, along the right temporalis muscle. ASPECTS [facility] Stroke Program Early CT Score) Total score (0-10 with 10 being normal): 10 IMPRESSION: 1. No acute cortically based infarct or intracranial hemorrhage identified. ASPECTS 10. 2. New left vertex skull erosion since 05/31/2024, suspicious for a metastatic lesion. These results were communicated to Dr. Michaela at 10:50 am on 06/26/2024 by text page via the [facility] messaging system. Electronically Signed   By: VEAR Hurst M.D.   On: 06/26/2024 10:51    Microbiology: Recent Results (from the past 240 hours)  Blood Culture (routine x 2)     Status: None   Collection Time: 06/26/24 10:36 AM   Specimen: BLOOD  Result Value Ref Range Status   Specimen Description BLOOD SITE NOT SPECIFIED  Final   Special Requests   Final    BOTTLES DRAWN AEROBIC AND ANAEROBIC Blood Culture results may not be optimal due to an inadequate volume of blood received in culture bottles   Culture   Final    NO GROWTH 5 DAYS Performed at [facility], [address], [place], [place] [address]    Report Status 07/01/2024 FINAL  Final  Blood Culture (routine x 2)     Status: Abnormal   Collection Time: 06/26/24 10:41 AM   Specimen: BLOOD  Result Value Ref Range Status   Specimen Description BLOOD LEFT ANTECUBITAL  Final   Special Requests    Final    BOTTLES DRAWN AEROBIC AND ANAEROBIC Blood Culture results may not be optimal due to an inadequate volume of blood received in culture bottles   Culture  Setup Time   Final    GRAM POSITIVE COCCI IN BOTH AEROBIC AND ANAEROBIC BOTTLES CRITICAL RESULT CALLED TO, READ BACK BY AND VERIFIED WITH: PHARMD JENNY ZHOU [id number] AT 1405 BY EC    Culture (A)  Final    STAPHYLOCOCCUS EPIDERMIDIS THE SIGNIFICANCE OF ISOLATING THIS ORGANISM FROM A SINGLE SET OF BLOOD CULTURES WHEN MULTIPLE SETS ARE DRAWN IS UNCERTAIN. PLEASE NOTIFY THE MICROBIOLOGY DEPARTMENT WITHIN ONE WEEK IF SPECIATION AND SENSITIVITIES ARE REQUIRED. Performed at [facility], [address], [place], [place] [address]    Report Status 06/29/2024 FINAL  Final  Blood Culture ID Panel (Reflexed)     Status: Abnormal   Collection Time: 06/26/24 10:41 AM  Result Value Ref Range Status   Enterococcus faecalis NOT DETECTED NOT DETECTED Final   Enterococcus Faecium NOT DETECTED NOT DETECTED Final   Listeria monocytogenes NOT DETECTED NOT DETECTED Final   Staphylococcus species DETECTED (A) NOT DETECTED Final    Comment: CRITICAL RESULT CALLED TO, READ BACK BY AND VERIFIED WITH: PHARMD JENNY ZHOU [id number] AT 1405 BY EC    Staphylococcus aureus (BCID) NOT DETECTED NOT DETECTED Final   Staphylococcus epidermidis DETECTED (A) NOT DETECTED Final    Comment: Methicillin (oxacillin) resistant coagulase negative staphylococcus. Possible blood culture contaminant (unless isolated from more than one blood culture draw or clinical case suggests pathogenicity). No antibiotic treatment is indicated for blood  culture contaminants. CRITICAL RESULT CALLED TO, READ BACK BY AND VERIFIED WITH: PHARMD JENNY ZHOU [phone] AT 1405 BY EC    Staphylococcus lugdunensis NOT DETECTED NOT DETECTED Final   Streptococcus species NOT DETECTED NOT DETECTED Final   Streptococcus  agalactiae NOT DETECTED NOT DETECTED Final   Streptococcus pneumoniae NOT  DETECTED NOT DETECTED Final   Streptococcus pyogenes NOT DETECTED NOT DETECTED Final   A.calcoaceticus-baumannii NOT DETECTED NOT DETECTED Final   Bacteroides fragilis NOT DETECTED NOT DETECTED Final   Enterobacterales NOT DETECTED NOT DETECTED Final   Enterobacter cloacae complex NOT DETECTED NOT DETECTED Final   Escherichia coli NOT DETECTED NOT DETECTED Final   Klebsiella aerogenes NOT DETECTED NOT DETECTED Final   Klebsiella oxytoca NOT DETECTED NOT DETECTED Final   Klebsiella pneumoniae NOT DETECTED NOT DETECTED Final   Proteus species NOT DETECTED NOT DETECTED Final   Salmonella species NOT DETECTED NOT DETECTED Final   Serratia marcescens NOT DETECTED NOT DETECTED Final   Haemophilus influenzae NOT DETECTED NOT DETECTED Final   Neisseria meningitidis NOT DETECTED NOT DETECTED Final   Pseudomonas aeruginosa NOT DETECTED NOT DETECTED Final   Stenotrophomonas maltophilia NOT DETECTED NOT DETECTED Final   Candida albicans NOT DETECTED NOT DETECTED Final   Candida auris NOT DETECTED NOT DETECTED Final   Candida glabrata NOT DETECTED NOT DETECTED Final   Candida krusei NOT DETECTED NOT DETECTED Final   Candida parapsilosis NOT DETECTED NOT DETECTED Final   Candida tropicalis NOT DETECTED NOT DETECTED Final   Cryptococcus neoformans/gattii NOT DETECTED NOT DETECTED Final   Methicillin resistance mecA/C DETECTED (A) NOT DETECTED Final    Comment: CRITICAL RESULT CALLED TO, READ BACK BY AND VERIFIED WITH: MAYA RANDALL CALL [phone] AT 1405 BY EC Performed at [facility], [address], [place], [place] [address]   MRSA Next Gen by PCR, Nasal     Status: None   Collection Time: 06/26/24 12:19 PM   Specimen: Nasal Mucosa; Nasal Swab  Result Value Ref Range Status   MRSA by PCR Next Gen NOT DETECTED NOT DETECTED Final    Comment: (NOTE) The GeneXpert MRSA Assay (FDA approved for NASAL specimens only), is one component of a comprehensive MRSA colonization surveillance program.  It is not intended to diagnose MRSA infection nor to guide or monitor treatment for MRSA infections. Test performance is not FDA approved in patients less than 6 years old. Performed at [facility], [address] [place], [place] [address]   Respiratory (~20 pathogens) panel by PCR     Status: None   Collection Time: 06/26/24 12:56 PM   Specimen: Nasopharyngeal Swab; Respiratory  Result Value Ref Range Status   Adenovirus NOT DETECTED NOT DETECTED Final   Coronavirus 229E NOT DETECTED NOT DETECTED Final    Comment: (NOTE) The Coronavirus on the Respiratory Panel, DOES NOT test for the novel  Coronavirus (2019 nCoV)    Coronavirus HKU1 NOT DETECTED NOT DETECTED Final   Coronavirus NL63 NOT DETECTED NOT DETECTED Final   Coronavirus OC43 NOT DETECTED NOT DETECTED Final   Metapneumovirus NOT DETECTED NOT DETECTED Final   Rhinovirus / Enterovirus NOT DETECTED NOT DETECTED Final   Influenza A NOT DETECTED NOT DETECTED Final   Influenza B NOT DETECTED NOT DETECTED Final   Parainfluenza Virus 1 NOT DETECTED NOT DETECTED Final   Parainfluenza Virus 2 NOT DETECTED NOT DETECTED Final   Parainfluenza Virus 3 NOT DETECTED NOT DETECTED Final   Parainfluenza Virus 4 NOT DETECTED NOT DETECTED Final   Respiratory Syncytial Virus NOT DETECTED NOT DETECTED Final   Bordetella pertussis NOT DETECTED NOT DETECTED Final   Bordetella Parapertussis NOT DETECTED NOT DETECTED Final   Chlamydophila pneumoniae NOT DETECTED NOT DETECTED Final   Mycoplasma pneumoniae NOT DETECTED  NOT DETECTED Final    Comment: Performed at [facility], [address], [place], [place] [address]  MRSA Next Gen by PCR, Nasal     Status: None   Collection Time: 07/02/24  3:43 AM   Specimen: Nasal Mucosa; Nasal Swab  Result Value Ref Range Status   MRSA by PCR Next Gen NOT DETECTED NOT DETECTED Final    Comment: (NOTE) The GeneXpert MRSA Assay (FDA approved for NASAL specimens only), is one component of a  comprehensive MRSA colonization surveillance program. It is not intended to diagnose MRSA infection nor to guide or monitor treatment for MRSA infections. Test performance is not FDA approved in patients less than 14 years old. Performed at [facility], [address], [place], [place] [address]     Time spent: 55 minutes  Signed: Elsie JAYSON Montclair, MD 2024-07-24

## 2024-07-23 NOTE — Progress Notes (Signed)
   07/29/24 1839  Spiritual Encounters  Type of Visit Initial  Conversation partners present during encounter Nurse  Referral source Other (comment)  Reason for visit Patient death  OnCall Visit Yes   Responded to page for chaplain due to patient's death - family not present. Family was contacted by nurse and were on their way to the hospital. Checked in with nurse, family have not yet arrived, unsure as to how far out they are. Checked with staff for spiritual care needs, not at this time. Advised nurse to page chaplain when family arrive.

## 2024-07-23 NOTE — Progress Notes (Signed)
 SLP Cancellation Note  Patient Details Name: Phillip Logan MRN: [id number] DOB: [birth year]   Cancelled treatment:       Reason Eval/Treat Not Completed: focus is on comfort at this time, will sign off.    Avari Gelles, Consuelo Fitch 07-08-2024, 11:01 AM

## 2024-07-23 NOTE — Progress Notes (Signed)
 Pt started having labored breathing while in room. Sats 92 on 15 L HFNC.  Pt breathing became more labored and saturations dropped to 70-80's. Placed non-rebreather and called respiratory to assess. PT bp lowered and stayed with patient while trying to contact wife and daughter. Left voicemails but family unaware of rapid decline. Daughter called and updated as well as wife. At restaurant but to come as soon as possible. Rapid, respiratory and this RN stayed with pt until his passing. MD aware. Ann Nena Hoard, RN

## 2024-07-23 NOTE — Progress Notes (Addendum)
 PROGRESS NOTE    Phillip Logan  FMW:[id number] DOB: Jul 11, 1946 DOA: 06/26/2024 PCP: Jama Chow, MD   Brief Narrative:  78 years old male with past medical history of multiple comorbidities, admitted to the hospital for management of septic shock in the setting of multifocal pneumonia, transferred out of the ICU on 06/28/2024.  He is a resident of [facility].   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 Multiple phone calls made to family (wife and daughter) and voicemail left explaining our concerns and that they should return to the hospital if possible. Will escalate oxygen to an NRB and give 500cc NS bolus to attempt to stabilize the patient enough for family visitation but would not progress care past that point. Recommend to discontinue monitoring at bedside and treat patient clinically as he continues to deteriorate. Expect in hospital death at this point due to his rapid decline this afternoon.  Goals of care - Lengthy discussion over the phone this morning and again this afternoon with wife/daughter - Will focus on comfort at this time due to work of breathing/agitation - morphine /ativan  low dose ordered in the interim. - Patient continues to decline with worsening renal function fever hypotension tachycardia.  He does appear more comfortable now with supportive measures including benzodiazepines and opiates for air hunger and discomfort. -Will consult palliative care to further discuss with family disposition in the next 24 hours should he continue to decline. - No further escalation of care at this time (discussed feeding tube/IV feeds/IV fluids/pressors - all of which carry risk  factors that outweigh any benefits in the interim)  Acute on chronic hypoxic respiratory failure, multifactorial Septic shock secondary to pneumonia, multifactorial High risk for aspiration, rule out pneumonitis Respiratory alkalosis Pleural effusion R>L - Acutely worse overnight after presumed aspiration event on the 11th - Imaging confirms effusion, restart IV furosemide , follow I's and O's - Wean oxygen as tolerated, currently on room air - Patient DNR but did discuss potential needing nonrebreather or BiPAP which wife confirmed would be reasonable.  RT following along, ABG unremarkable, initially concern for hypercarbia but given patient's tachypnea he CO2 levels remain essentially normal(mild respiratory alkalosis)   Septic shock, POA secondary to multifocal pneumonia - Shock resolved, off pressors, transition out of ICU 7/7 - Recurrent aspiration presumed, continue ceftriaxone  and Flagyl    Acute metabolic encephalopathy - Patient appears more obtunded/lethargic.  Worsening O2 overnight, suspicion for worsening pneumonia, possible aspiration. Monitor function closely. 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  Patient at baseline.   Acute kidney injury - Likely initially ATN in the setting of septic shock.   - Currently within normal limits   Acute on chronic normocytic anemia - Status post 1 unit PRBC 7/7.   - No active  bleeding,  hemoglobin continues to improve   Recurrent/locally advanced squamous cell skin cancer - S/p chemo, outpatient follow-up with oncology and dermatology.   Physical debilitation muscle weakness - Previously at short-term rehab.  PT/OT on board recommending same.   Arthritic hand pain - Chronic appearing.  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  Voltaren  gel on board.   Skin tear lower extremity - Current during therapy session.  Cleaned, Steri-Strips applied, covered with bandage.  Change dressing daily.   DVT prophylaxis: heparin  injection 5,000 Units Start: 06/26/24 1400 Code Status:   Code Status: Limited: Do not attempt resuscitation (DNR) -DNR-LIMITED -Do Not Intubate/DNI  Family Communication: Wife updated over the phone  Status is: Inpatient  Dispo: The patient is from: Home              Anticipated d/c is to: Home              Anticipated d/c date is: To be determined              Patient currently not medically stable for discharge  Consultants:  PCCM  Procedures:  None  Antimicrobials:  Azithromycin , ceftriaxone , metronidazole   Subjective: No acute issues or events overnight, patient remains poorly responsive, high risk for decompensation as per discussion above with family.  Review of systems unobtainable  Objective: Vitals:   07-28-2024 0035 28-Jul-2024 0040 2024/07/28 0400 07-28-2024 0744  BP:    (!) 87/52  Pulse: (!) 109 (!) 107  (!) 106  Resp:   (!) 24 18  Temp:   99.9 F (37.7 C) (!) 102.4 F (39.1 C)  TempSrc:   Oral Axillary  SpO2: 97% 99%  97%  Weight:      Height:       No intake or output data in the 24 hours ending 07/28/24 0807  Filed Weights   07/02/24 0455 07/03/24 0500 07/04/24 0500  Weight: 73.8 kg 70.3 kg 70.3 kg    Examination:  General: Somnolent gaunt appearing gentleman, poorly arousable but protecting airway HEENT: Pupils equal round reactive to light, otherwise nonicteric, extraocular movements  intact Neck:  Without mass or deformity.  Trachea is midline. Lungs: Diffusely rhonchorous with bibasilar rales Heart: Tachycardic, regular rhythm without overt murmurs rubs or gallops. Abdomen:  Soft, nontender, nondistended.  Without guarding or rebound. Extremities: Without notable edema.     Data Reviewed: I have personally reviewed following labs and imaging studies  CBC: Recent Labs  Lab 06/30/24 0744 07/01/24 0429 07/02/24 0327 07/03/24 0800 2024-07-28 0407  WBC 15.5* 13.4* 15.9* 13.1* 14.3*  NEUTROABS 13.3*  --  13.3*  --   --   HGB 10.6* 9.3* 11.4* 12.3* 11.9*  HCT 33.2* 29.4* 35.5* 39.3 40.1  MCV 89.0 88.3 88.1 88.3 93.9  PLT 241 209 221 299 330   Basic Metabolic Panel: Recent Labs  Lab 06/30/24 0744 07/01/24 0429 07/02/24 0327 07/03/24 0800 28-Jul-2024 0407  NA 136 133* 137 139 148*  K 3.1* 2.9* 3.6 3.2* 3.9  CL 94* 93* 95* 96* 108  CO2 28 30 26  20* 21*  GLUCOSE 68* 84 74 95 85  BUN 16 14 10 16  35*  CREATININE 0.99 0.93 0.96 1.10 2.67*  CALCIUM 8.5* 8.0* 8.6* 8.8* 8.3*  MG  --  1.1* 1.4* 1.9  --    GFR: Estimated Creatinine Clearance: 21.7 mL/min (A) (by C-G formula based on SCr of 2.67 mg/dL (H)). Liver Function Tests: Recent Labs  Lab 07/02/24 0327  AST 39  ALT 34  ALKPHOS 124  BILITOT 1.7*  PROT 6.5  ALBUMIN  2.4*   No results for input(s): LIPASE, AMYLASE in the last 168 hours. No results for input(s): AMMONIA in the last 168 hours. Coagulation Profile: No results for input(s): INR, PROTIME in the last 168 hours.  Cardiac Enzymes: No results for input(s): CKTOTAL, CKMB, CKMBINDEX, TROPONINI in the last 168 hours. BNP (last 3 results) No results for input(s): PROBNP in the last 8760 hours. HbA1C: No results for input(s): HGBA1C in the last 72 hours. CBG: Recent Labs  Lab 07/04/24 1712 07/04/24 1958 07/13/24 0111 07-13-24 0407 07/13/2024 0749  GLUCAP 96 94 106* 92 74   Lipid Profile: No results for input(s):  CHOL, HDL, LDLCALC, TRIG, CHOLHDL, LDLDIRECT in the last 72 hours. Thyroid  Function Tests: No results for input(s): TSH, T4TOTAL, FREET4, T3FREE, THYROIDAB in the last 72 hours. Anemia Panel: No results for input(s): VITAMINB12, FOLATE, FERRITIN, TIBC, IRON, RETICCTPCT in the last 72 hours. Sepsis Labs: Recent Labs  Lab 07/02/24 0327 07/03/24 0800  PROCALCITON 3.51 2.73    Recent Results (from the past 240 hours)  Blood Culture (routine x 2)     Status: None   Collection Time: 06/26/24 10:36 AM   Specimen: BLOOD  Result Value Ref Range Status   Specimen Description BLOOD SITE NOT SPECIFIED  Final   Special Requests   Final    BOTTLES DRAWN AEROBIC AND ANAEROBIC Blood Culture results may not be optimal due to an inadequate volume of blood received in culture bottles   Culture   Final    NO GROWTH 5 DAYS Performed at [facility], [address], [place], [place] [address]    Report Status 07/01/2024 FINAL  Final  Blood Culture (routine x 2)     Status: Abnormal   Collection Time: 06/26/24 10:41 AM   Specimen: BLOOD  Result Value Ref Range Status   Specimen Description BLOOD LEFT ANTECUBITAL  Final   Special Requests   Final    BOTTLES DRAWN AEROBIC AND ANAEROBIC Blood Culture results may not be optimal due to an inadequate volume of blood received in culture bottles   Culture  Setup Time   Final    GRAM POSITIVE COCCI IN BOTH AEROBIC AND ANAEROBIC BOTTLES CRITICAL RESULT CALLED TO, READ BACK BY AND VERIFIED WITH: PHARMD JENNY ZHOU [id number] AT 1405 BY EC    Culture (A)  Final    STAPHYLOCOCCUS EPIDERMIDIS THE SIGNIFICANCE OF ISOLATING THIS ORGANISM FROM A SINGLE SET OF BLOOD CULTURES WHEN MULTIPLE SETS ARE DRAWN IS UNCERTAIN. PLEASE NOTIFY THE MICROBIOLOGY DEPARTMENT WITHIN ONE WEEK IF SPECIATION AND SENSITIVITIES ARE REQUIRED. Performed at [facility], [address], [place], [place] [address]    Report Status 06/29/2024 FINAL   Final  Blood Culture ID Panel (Reflexed)     Status: Abnormal   Collection Time: 06/26/24 10:41 AM  Result Value Ref Range Status   Enterococcus faecalis NOT DETECTED NOT DETECTED Final   Enterococcus Faecium NOT DETECTED NOT DETECTED Final   Listeria monocytogenes NOT DETECTED NOT DETECTED Final   Staphylococcus species DETECTED (A) NOT DETECTED Final    Comment: CRITICAL RESULT CALLED TO, READ BACK BY AND VERIFIED WITH: PHARMD JENNY ZHOU [phone] AT 1405 BY EC    Staphylococcus aureus (BCID) NOT DETECTED NOT DETECTED Final   Staphylococcus epidermidis DETECTED (A) NOT DETECTED Final    Comment: Methicillin (oxacillin) resistant coagulase negative staphylococcus. Possible blood culture contaminant (unless isolated from  more than one blood culture draw or clinical case suggests pathogenicity). No antibiotic treatment is indicated for blood  culture contaminants. CRITICAL RESULT CALLED TO, READ BACK BY AND VERIFIED WITH: PHARMD JENNY ZHOU [id number] AT 1405 BY EC    Staphylococcus lugdunensis NOT DETECTED NOT DETECTED Final   Streptococcus species NOT DETECTED NOT DETECTED Final   Streptococcus agalactiae NOT DETECTED NOT DETECTED Final   Streptococcus pneumoniae NOT DETECTED NOT DETECTED Final   Streptococcus pyogenes NOT DETECTED NOT DETECTED Final   A.calcoaceticus-baumannii NOT DETECTED NOT DETECTED Final   Bacteroides fragilis NOT DETECTED NOT DETECTED Final   Enterobacterales NOT DETECTED NOT DETECTED Final   Enterobacter cloacae complex NOT DETECTED NOT DETECTED Final   Escherichia coli NOT DETECTED NOT DETECTED Final   Klebsiella aerogenes NOT DETECTED NOT DETECTED Final   Klebsiella oxytoca NOT DETECTED NOT DETECTED Final   Klebsiella pneumoniae NOT DETECTED NOT DETECTED Final   Proteus species NOT DETECTED NOT DETECTED Final   Salmonella species NOT DETECTED NOT DETECTED Final   Serratia marcescens NOT DETECTED NOT DETECTED Final   Haemophilus influenzae NOT DETECTED NOT  DETECTED Final   Neisseria meningitidis NOT DETECTED NOT DETECTED Final   Pseudomonas aeruginosa NOT DETECTED NOT DETECTED Final   Stenotrophomonas maltophilia NOT DETECTED NOT DETECTED Final   Candida albicans NOT DETECTED NOT DETECTED Final   Candida auris NOT DETECTED NOT DETECTED Final   Candida glabrata NOT DETECTED NOT DETECTED Final   Candida krusei NOT DETECTED NOT DETECTED Final   Candida parapsilosis NOT DETECTED NOT DETECTED Final   Candida tropicalis NOT DETECTED NOT DETECTED Final   Cryptococcus neoformans/gattii NOT DETECTED NOT DETECTED Final   Methicillin resistance mecA/C DETECTED (A) NOT DETECTED Final    Comment: CRITICAL RESULT CALLED TO, READ BACK BY AND VERIFIED WITH: MAYA RANDALL CALL [phone] AT 1405 BY EC Performed at [facility], [address] [place], [place] [address]   MRSA Next Gen by PCR, Nasal     Status: None   Collection Time: 06/26/24 12:19 PM   Specimen: Nasal Mucosa; Nasal Swab  Result Value Ref Range Status   MRSA by PCR Next Gen NOT DETECTED NOT DETECTED Final    Comment: (NOTE) The GeneXpert MRSA Assay (FDA approved for NASAL specimens only), is one component of a comprehensive MRSA colonization surveillance program. It is not intended to diagnose MRSA infection nor to guide or monitor treatment for MRSA infections. Test performance is not FDA approved in patients less than 78 years old. Performed at [facility], [address] [place], [place] [address]   Respiratory (~20 pathogens) panel by PCR     Status: None   Collection Time: 06/26/24 12:56 PM   Specimen: Nasopharyngeal Swab; Respiratory  Result Value Ref Range Status   Adenovirus NOT DETECTED NOT DETECTED Final   Coronavirus 229E NOT DETECTED NOT DETECTED Final    Comment: (NOTE) The Coronavirus on the Respiratory Panel, DOES NOT test for the novel  Coronavirus (2019 nCoV)    Coronavirus HKU1 NOT DETECTED NOT DETECTED Final   Coronavirus NL63 NOT DETECTED NOT  DETECTED Final   Coronavirus OC43 NOT DETECTED NOT DETECTED Final   Metapneumovirus NOT DETECTED NOT DETECTED Final   Rhinovirus / Enterovirus NOT DETECTED NOT DETECTED Final   Influenza A NOT DETECTED NOT DETECTED Final   Influenza B NOT DETECTED NOT DETECTED Final   Parainfluenza Virus 1 NOT DETECTED NOT DETECTED Final   Parainfluenza Virus 2 NOT DETECTED NOT DETECTED Final  Parainfluenza Virus 3 NOT DETECTED NOT DETECTED Final   Parainfluenza Virus 4 NOT DETECTED NOT DETECTED Final   Respiratory Syncytial Virus NOT DETECTED NOT DETECTED Final   Bordetella pertussis NOT DETECTED NOT DETECTED Final   Bordetella Parapertussis NOT DETECTED NOT DETECTED Final   Chlamydophila pneumoniae NOT DETECTED NOT DETECTED Final   Mycoplasma pneumoniae NOT DETECTED NOT DETECTED Final    Comment: Performed at [facility], [address], [place], [place] [address]  MRSA Next Gen by PCR, Nasal     Status: None   Collection Time: 07/02/24  3:43 AM   Specimen: Nasal Mucosa; Nasal Swab  Result Value Ref Range Status   MRSA by PCR Next Gen NOT DETECTED NOT DETECTED Final    Comment: (NOTE) The GeneXpert MRSA Assay (FDA approved for NASAL specimens only), is one component of a comprehensive MRSA colonization surveillance program. It is not intended to diagnose MRSA infection nor to guide or monitor treatment for MRSA infections. Test performance is not FDA approved in patients less than 63 years old. Performed at [facility], [address] [place], [place] [address]          Radiology Studies: CT HEAD WO CONTRAST ( ) Result Date: 07/04/2024 CLINICAL DATA:  Mental status change, persistent or worsening EXAM: CT HEAD WITHOUT CONTRAST TECHNIQUE: Contiguous axial images were obtained from the base of the skull through the vertex without intravenous contrast. RADIATION DOSE REDUCTION: This exam was performed according to the departmental dose-optimization program which includes  automated exposure control, adjustment of the mA and/or kV according to patient size and/or use of iterative reconstruction technique. COMPARISON:  CT head 07/02/2024. FINDINGS: Brain: No evidence of acute infarction, hemorrhage, hydrocephalus, extra-axial collection or mass lesion/mass effect. Patchy white matter hypodensities are nonspecific but compatible with chronic microvascular ischemic disease. Cerebral atrophy. Vascular: No hyperdense vessel identified. Calcific atherosclerosis. Skull: Redemonstrated erosive lesion at the skull vertex an additional possible small lytic lesion in the right frontal calvarium. Sinuses/Orbits: No acute orbital abnormality. Right lid weight. Mild left sphenoid sinus mucosal thickening. Other: No mastoid effusions. IMPRESSION: 1. No evidence of acute intracranial abnormality. 2. 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 3. Similar calvarial lesions, as described on recent CT head. Electronically Signed   By: Gilmore GORMAN Molt M.D.   On: 07/04/2024 03:30        Scheduled Meds:  acidophilus  1 capsule Oral TID   colchicine   0.6 mg Oral BID   diclofenac  Sodium  2 g Topical QID   feeding supplement  237 mL Oral BID BM   furosemide   40 mg Intravenous BID   gabapentin   400 mg Oral BID   Gerhardt's butt cream   Topical BID   heparin   5,000 Units Subcutaneous Q8H   insulin  aspart  0-9 Units Subcutaneous Q4H   nicotine   14 mg Transdermal Daily   nystatin   5 mL Oral TID AC & HS   scopolamine   1 patch Transdermal Q72H   sodium chloride  flush  10-40 mL Intracatheter Q12H   sodium chloride  flush  3 mL Intravenous Once   Continuous Infusions:  cefTRIAXone  (ROCEPHIN )  IV 2 g (07/04/24 1301)   metronidazole  500 mg (07/04/24 2222)     LOS: 9 days   Time spent:  Elsie JAYSON Montclair, DO [facility]  If 7PM-7AM, please contact night-coverage [email or web address]  07-08-24, 8:07 AM

## 2024-07-23 NOTE — Progress Notes (Signed)
   08/01/24 2018  Spiritual Encounters  Type of Visit Follow up  Care provided to: [facility] partners present during encounter Nurse  Referral source Other (comment)  Reason for visit Patient death  OnCall Visit Yes   Family arrived at hospital. Wife, daughter and grandson present. Provided spiritual care, prayer and support. Engaged in conversation compassionate presence.  Assisted family with contacting funeral homes in the area and crematories. Daughter received several estimates however, family will have to discuss with son before choosing. Family given a patient placement card to notify hospital in the morning.

## 2024-07-23 NOTE — Significant Event (Addendum)
 Rapid Response Event Note   Reason for Call :  Agonal respirations  Initial Focused Assessment:  Pt in bed, no response to voice or touch. Patient having prolonged periods of apnea. Currently on 15L HFNC and 100% NRB, SpO2 86%. Diffuse mottling. Peripheral pulse weak.   VS: BP 44/36, HR 80, RR 8, SpO2 80% on 15L HFNC & 100% NRB CBG: 83  Interventions:  -CBG -MAR reviewed   Plan of Care:  -Nursing staff attempting to reach family listed in the chart: spouse and daughter -1800:  Wife updated via telephone. She is on her way back to hospital.   Event Summary:  MD Notified: Dr. Lue Call Time: 1734 Arrival Time: 1736 End Time: 1815  Leonor LITTIE Danker, RN

## 2024-07-23 DEATH — deceased

## 2024-09-22 ENCOUNTER — Ambulatory Visit: Admitting: Oncology

## 2024-09-22 ENCOUNTER — Other Ambulatory Visit
